# Patient Record
Sex: Female | Born: 1950 | Race: Black or African American | Hispanic: No | Marital: Married | Smoking: Never smoker
Health system: Southern US, Community
[De-identification: ages and names within clinical notes are randomized; demographics above are authoritative.]

## PROBLEM LIST (undated history)

## (undated) DIAGNOSIS — I10 Essential (primary) hypertension: Secondary | ICD-10-CM

## (undated) DIAGNOSIS — N189 Chronic kidney disease, unspecified: Secondary | ICD-10-CM

## (undated) HISTORY — PX: MULTIPLE TOOTH EXTRACTIONS: SHX2053

## (undated) HISTORY — PX: CATARACT EXTRACTION: SUR2

## (undated) HISTORY — PX: EYE SURGERY: SHX253

## (undated) HISTORY — PX: BREAST BIOPSY: SHX20

## (undated) HISTORY — PX: COLONOSCOPY W/ BIOPSIES AND POLYPECTOMY: SHX1376

---

## 1997-10-05 ENCOUNTER — Other Ambulatory Visit: Admission: RE | Admit: 1997-10-05 | Discharge: 1997-10-05 | Payer: Self-pay | Admitting: Obstetrics

## 1998-03-07 ENCOUNTER — Ambulatory Visit (HOSPITAL_COMMUNITY): Admission: RE | Admit: 1998-03-07 | Discharge: 1998-03-07 | Payer: Self-pay | Admitting: Obstetrics

## 1999-05-02 ENCOUNTER — Other Ambulatory Visit: Admission: RE | Admit: 1999-05-02 | Discharge: 1999-05-02 | Payer: Self-pay | Admitting: Obstetrics

## 1999-05-10 ENCOUNTER — Ambulatory Visit (HOSPITAL_COMMUNITY): Admission: RE | Admit: 1999-05-10 | Discharge: 1999-05-10 | Payer: Self-pay | Admitting: Obstetrics

## 1999-05-10 ENCOUNTER — Encounter: Payer: Self-pay | Admitting: Obstetrics

## 2000-06-17 ENCOUNTER — Ambulatory Visit (HOSPITAL_COMMUNITY): Admission: RE | Admit: 2000-06-17 | Discharge: 2000-06-17 | Payer: Self-pay | Admitting: Obstetrics

## 2000-06-17 ENCOUNTER — Encounter: Payer: Self-pay | Admitting: Obstetrics

## 2000-08-19 ENCOUNTER — Other Ambulatory Visit: Admission: RE | Admit: 2000-08-19 | Discharge: 2000-08-19 | Payer: Self-pay | Admitting: Obstetrics

## 2001-07-07 ENCOUNTER — Encounter: Payer: Self-pay | Admitting: Obstetrics

## 2001-07-07 ENCOUNTER — Ambulatory Visit (HOSPITAL_COMMUNITY): Admission: RE | Admit: 2001-07-07 | Discharge: 2001-07-07 | Payer: Self-pay | Admitting: Obstetrics

## 2002-08-31 ENCOUNTER — Ambulatory Visit (HOSPITAL_COMMUNITY): Admission: RE | Admit: 2002-08-31 | Discharge: 2002-08-31 | Payer: Self-pay | Admitting: Obstetrics

## 2002-08-31 ENCOUNTER — Encounter: Payer: Self-pay | Admitting: Obstetrics

## 2003-09-26 ENCOUNTER — Ambulatory Visit (HOSPITAL_COMMUNITY): Admission: RE | Admit: 2003-09-26 | Discharge: 2003-09-26 | Payer: Self-pay | Admitting: Obstetrics

## 2003-09-28 ENCOUNTER — Encounter: Admission: RE | Admit: 2003-09-28 | Discharge: 2003-09-28 | Payer: Self-pay | Admitting: Obstetrics

## 2004-10-18 ENCOUNTER — Ambulatory Visit (HOSPITAL_COMMUNITY): Admission: RE | Admit: 2004-10-18 | Discharge: 2004-10-18 | Payer: Self-pay | Admitting: Obstetrics

## 2005-12-30 ENCOUNTER — Encounter: Admission: RE | Admit: 2005-12-30 | Discharge: 2005-12-30 | Payer: Self-pay | Admitting: Obstetrics

## 2005-12-31 ENCOUNTER — Ambulatory Visit (HOSPITAL_COMMUNITY): Admission: RE | Admit: 2005-12-31 | Discharge: 2005-12-31 | Payer: Self-pay | Admitting: Obstetrics

## 2007-01-30 ENCOUNTER — Ambulatory Visit (HOSPITAL_COMMUNITY): Admission: RE | Admit: 2007-01-30 | Discharge: 2007-01-30 | Payer: Self-pay | Admitting: Obstetrics

## 2007-02-06 ENCOUNTER — Encounter: Admission: RE | Admit: 2007-02-06 | Discharge: 2007-02-06 | Payer: Self-pay | Admitting: Obstetrics

## 2007-05-05 ENCOUNTER — Encounter (INDEPENDENT_AMBULATORY_CARE_PROVIDER_SITE_OTHER): Payer: Self-pay | Admitting: Gastroenterology

## 2007-05-05 ENCOUNTER — Ambulatory Visit (HOSPITAL_COMMUNITY): Admission: RE | Admit: 2007-05-05 | Discharge: 2007-05-05 | Payer: Self-pay | Admitting: Gastroenterology

## 2007-05-28 HISTORY — PX: BREAST SURGERY: SHX581

## 2008-02-25 ENCOUNTER — Encounter: Admission: RE | Admit: 2008-02-25 | Discharge: 2008-02-25 | Payer: Self-pay | Admitting: Obstetrics

## 2008-03-02 ENCOUNTER — Encounter (INDEPENDENT_AMBULATORY_CARE_PROVIDER_SITE_OTHER): Payer: Self-pay | Admitting: Diagnostic Radiology

## 2008-03-02 ENCOUNTER — Encounter: Admission: RE | Admit: 2008-03-02 | Discharge: 2008-03-02 | Payer: Self-pay | Admitting: Obstetrics

## 2009-04-11 ENCOUNTER — Ambulatory Visit (HOSPITAL_COMMUNITY): Admission: RE | Admit: 2009-04-11 | Discharge: 2009-04-11 | Payer: Self-pay | Admitting: Cardiology

## 2009-05-27 HISTORY — PX: REFRACTIVE SURGERY: SHX103

## 2009-08-04 ENCOUNTER — Encounter: Admission: RE | Admit: 2009-08-04 | Discharge: 2009-08-04 | Payer: Self-pay | Admitting: Obstetrics

## 2009-08-10 ENCOUNTER — Ambulatory Visit: Payer: Self-pay | Admitting: Hematology and Oncology

## 2009-08-25 LAB — CBC & DIFF AND RETIC
EOS%: 0.5 % (ref 0.0–7.0)
Eosinophils Absolute: 0 10*3/uL (ref 0.0–0.5)
HGB: 10.9 g/dL — ABNORMAL LOW (ref 11.6–15.9)
Immature Retic Fract: 3.2 % (ref 0.00–10.70)
MCHC: 34 g/dL (ref 31.5–36.0)
MONO#: 0.2 10*3/uL (ref 0.1–0.9)
NEUT#: 2.5 10*3/uL (ref 1.5–6.5)
Platelets: 164 10*3/uL (ref 145–400)
Retic %: 1.1 % (ref 0.50–1.50)
WBC: 4.1 10*3/uL (ref 3.9–10.3)
lymph#: 1.3 10*3/uL (ref 0.9–3.3)

## 2009-08-25 LAB — URINALYSIS, MICROSCOPIC - CHCC
Bilirubin (Urine): NEGATIVE
Nitrite: NEGATIVE
Protein: <30 mg/dL

## 2009-08-25 LAB — MORPHOLOGY

## 2009-08-29 LAB — PROTEIN ELECTROPHORESIS, SERUM, WITH REFLEX
Alpha-1-Globulin: 7.8 % — ABNORMAL HIGH (ref 2.9–4.9)
Alpha-2-Globulin: 9.9 % (ref 7.1–11.8)
Beta 2: 4.3 % (ref 3.2–6.5)
Beta Globulin: 7.3 % — ABNORMAL HIGH (ref 4.7–7.2)
Total Protein, Serum Electrophoresis: 7.2 g/dL (ref 6.0–8.3)

## 2009-08-29 LAB — TSH: TSH: 1.1 u[IU]/mL (ref 0.350–4.500)

## 2009-08-29 LAB — COMPREHENSIVE METABOLIC PANEL
ALT: 25 U/L (ref 0–35)
Alkaline Phosphatase: 64 U/L (ref 39–117)
CO2: 25 mEq/L (ref 19–32)
Calcium: 8.9 mg/dL (ref 8.4–10.5)
Chloride: 103 mEq/L (ref 96–112)
Creatinine, Ser: 1.03 mg/dL (ref 0.40–1.20)

## 2009-08-29 LAB — HAPTOGLOBIN: Haptoglobin: 127 mg/dL (ref 16–200)

## 2009-09-04 LAB — BASIC METABOLIC PANEL
CO2: 25 mEq/L (ref 19–32)
Potassium: 3.7 mEq/L (ref 3.5–5.3)
Sodium: 141 mEq/L (ref 135–145)

## 2009-09-04 LAB — HEMOGLOBINOPATHY EVALUATION
Hgb A: 97.2 % (ref 96.8–97.8)
Hgb S Quant: 0 % (ref 0.0–0.0)

## 2009-11-19 ENCOUNTER — Emergency Department (HOSPITAL_COMMUNITY): Admission: EM | Admit: 2009-11-19 | Discharge: 2009-11-19 | Payer: Self-pay | Admitting: Emergency Medicine

## 2009-12-20 ENCOUNTER — Ambulatory Visit: Payer: Self-pay | Admitting: Hematology and Oncology

## 2010-06-17 ENCOUNTER — Encounter: Payer: Self-pay | Admitting: Cardiology

## 2010-08-12 LAB — BASIC METABOLIC PANEL
BUN: 18 mg/dL (ref 6–23)
CO2: 28 mEq/L (ref 19–32)
Chloride: 105 mEq/L (ref 96–112)
Creatinine, Ser: 1.2 mg/dL (ref 0.4–1.2)
Glucose, Bld: 141 mg/dL — ABNORMAL HIGH (ref 70–99)
Potassium: 3.9 mEq/L (ref 3.5–5.1)
Sodium: 142 mEq/L (ref 135–145)

## 2010-08-12 LAB — TROPONIN I: Troponin I: 0.02 ng/mL (ref 0.00–0.06)

## 2010-08-12 LAB — DIFFERENTIAL
Basophils Absolute: 0 10*3/uL (ref 0.0–0.1)
Lymphocytes Relative: 16 % (ref 12–46)

## 2010-08-12 LAB — CBC
HCT: 30.4 % — ABNORMAL LOW (ref 36.0–46.0)
Hemoglobin: 10.4 g/dL — ABNORMAL LOW (ref 12.0–15.0)
MCH: 28.8 pg (ref 26.0–34.0)
MCHC: 34.3 g/dL (ref 30.0–36.0)
MCV: 84.1 fL (ref 78.0–100.0)
RBC: 3.62 MIL/uL — ABNORMAL LOW (ref 3.87–5.11)
RDW: 13.4 % (ref 11.5–15.5)
WBC: 6 10*3/uL (ref 4.0–10.5)

## 2010-08-12 LAB — CK TOTAL AND CKMB (NOT AT ARMC): Total CK: 189 U/L — ABNORMAL HIGH (ref 7–177)

## 2010-08-27 ENCOUNTER — Other Ambulatory Visit: Payer: Self-pay | Admitting: Obstetrics

## 2010-08-27 DIAGNOSIS — Z1231 Encounter for screening mammogram for malignant neoplasm of breast: Secondary | ICD-10-CM

## 2010-08-31 ENCOUNTER — Ambulatory Visit
Admission: RE | Admit: 2010-08-31 | Discharge: 2010-08-31 | Disposition: A | Discharge status: Home / Self Care | Payer: 59 | Source: Ambulatory Visit | Attending: Obstetrics | Admitting: Obstetrics

## 2010-08-31 DIAGNOSIS — Z1231 Encounter for screening mammogram for malignant neoplasm of breast: Secondary | ICD-10-CM

## 2010-09-10 ENCOUNTER — Other Ambulatory Visit (HOSPITAL_COMMUNITY): Payer: Self-pay | Admitting: Obstetrics

## 2010-09-10 DIAGNOSIS — Z780 Asymptomatic menopausal state: Secondary | ICD-10-CM

## 2010-09-18 ENCOUNTER — Ambulatory Visit (HOSPITAL_COMMUNITY): Admission: RE | Admit: 2010-09-18 | Payer: 59 | Source: Ambulatory Visit

## 2010-09-20 ENCOUNTER — Ambulatory Visit (HOSPITAL_COMMUNITY)
Admission: RE | Admit: 2010-09-20 | Discharge: 2010-09-20 | Disposition: A | Discharge status: Home / Self Care | Payer: 59 | Source: Ambulatory Visit | Attending: Obstetrics | Admitting: Obstetrics

## 2010-09-20 DIAGNOSIS — Z13820 Encounter for screening for osteoporosis: Secondary | ICD-10-CM | POA: Insufficient documentation

## 2010-09-20 DIAGNOSIS — Z780 Asymptomatic menopausal state: Secondary | ICD-10-CM | POA: Insufficient documentation

## 2010-10-09 NOTE — Op Note (Signed)
NAMEAUBRIEGH, Kristy Santos               ACCOUNT NO.:  [id number]   MEDICAL RECORD NO.:  [id number]          PATIENT TYPE:  AMB   LOCATION:  ENDO                         FACILITY:  [facility]   PHYSICIAN:  Ronald Lobo, M.D.   DATE OF BIRTH:  28-Jan-1951   DATE OF PROCEDURE:  05/05/2007  DATE OF DISCHARGE:                               OPERATIVE REPORT   PROCEDURE PERFORMED:  Colonoscopy with biopsy.   ENDOSCOPIST:  Cleotis Nipper, M.D.   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  No family history of colon cancer or colon polyps.   FINDINGS:  Several small polyps removed.  Right-sided diverticulosis.   DESCRIPTION OF PROCEDURE:  The nature, purpose and risks of the  procedure had been discussed with the patient, who provided written  consent.  Sedation was fentanyl 100 mcg and Versed 10 mg IV prior to and  during the course of the procedure without clinical instability.   The Pentax adult video colonoscope was advanced to the cecum without  significant difficulty, using some external abdominal compression to  control looping to enter the base of the cecum which was identified by  clear visualization of the appendiceal orifice.  The terminal ileum was  not entered despite brief attempts to do so.   Pullback was then performed.  There was some mild diverticular change in  the ascending colon and possibly also in the proximal transverse colon.   There was a diminutive 2 mm polyp on a fold in the ascending colon  removed by a single cold biopsy.   There was a 6 mm semipedunculated polyp removed by cold biopsy  technique, very soft tissue, at about 50 cm, probably the distal  transverse colon near the splenic flexure.  There were several  diminutive sessile hyperplastic appearing polyps removed by cold biopsy  in the midsigmoid region at about 25 cm.   No large polyps, cancer, colitis  or vascular ectasia were observed and  retroflexion in the rectum and reinspection of the rectum was  unremarkable.  The patient tolerated the procedure well and there were  no apparent complications.   IMPRESSION:  1. Colon polyps removed as described above, diminutive or small in      size.  2. Proximal colonic diverticulosis.   PLAN:  Await pathology results.           ______________________________  Ronald Lobo, M.D.     RB/MEDQ  D:  05/05/2007  T:  05/05/2007  Job:  [id number]   cc:   Ardyth Gal. Spruill, M.D.  Fax: [phone]

## 2011-12-23 HISTORY — PX: ENDOMETRIAL BIOPSY: SHX622

## 2011-12-24 ENCOUNTER — Other Ambulatory Visit: Payer: Self-pay | Admitting: Obstetrics

## 2011-12-24 DIAGNOSIS — Z1231 Encounter for screening mammogram for malignant neoplasm of breast: Secondary | ICD-10-CM

## 2011-12-27 ENCOUNTER — Ambulatory Visit: Payer: 59

## 2011-12-31 ENCOUNTER — Encounter (HOSPITAL_COMMUNITY): Payer: Self-pay | Admitting: Pharmacy Technician

## 2012-01-01 ENCOUNTER — Encounter (HOSPITAL_COMMUNITY): Payer: Self-pay | Admitting: Pharmacy Technician

## 2012-01-02 ENCOUNTER — Ambulatory Visit
Admission: RE | Admit: 2012-01-02 | Discharge: 2012-01-02 | Disposition: A | Discharge status: Home / Self Care | Payer: 59 | Source: Ambulatory Visit | Attending: Obstetrics | Admitting: Obstetrics

## 2012-01-02 DIAGNOSIS — Z1231 Encounter for screening mammogram for malignant neoplasm of breast: Secondary | ICD-10-CM

## 2012-01-07 ENCOUNTER — Ambulatory Visit: Payer: 59 | Attending: Gynecologic Oncology | Admitting: Gynecologic Oncology

## 2012-01-07 ENCOUNTER — Encounter (HOSPITAL_COMMUNITY)
Admission: RE | Admit: 2012-01-07 | Discharge: 2012-01-07 | Disposition: A | Discharge status: Home / Self Care | Payer: 59 | Source: Ambulatory Visit | Attending: Gynecologic Oncology | Admitting: Gynecologic Oncology

## 2012-01-07 ENCOUNTER — Ambulatory Visit (HOSPITAL_COMMUNITY)
Admission: RE | Admit: 2012-01-07 | Discharge: 2012-01-07 | Disposition: A | Discharge status: Home / Self Care | Payer: 59 | Source: Ambulatory Visit | Attending: Gynecologic Oncology | Admitting: Gynecologic Oncology

## 2012-01-07 ENCOUNTER — Encounter (HOSPITAL_COMMUNITY): Payer: Self-pay

## 2012-01-07 ENCOUNTER — Encounter: Payer: Self-pay | Admitting: Gynecologic Oncology

## 2012-01-07 VITALS — BP 148/64 | HR 78 | Temp 98.3°F | Resp 22 | Ht 63.5 in | Wt 199.0 lb

## 2012-01-07 DIAGNOSIS — Z01818 Encounter for other preprocedural examination: Secondary | ICD-10-CM | POA: Insufficient documentation

## 2012-01-07 DIAGNOSIS — C549 Malignant neoplasm of corpus uteri, unspecified: Secondary | ICD-10-CM | POA: Insufficient documentation

## 2012-01-07 DIAGNOSIS — Z803 Family history of malignant neoplasm of breast: Secondary | ICD-10-CM | POA: Insufficient documentation

## 2012-01-07 DIAGNOSIS — R9431 Abnormal electrocardiogram [ECG] [EKG]: Secondary | ICD-10-CM | POA: Insufficient documentation

## 2012-01-07 DIAGNOSIS — Z79899 Other long term (current) drug therapy: Secondary | ICD-10-CM | POA: Insufficient documentation

## 2012-01-07 DIAGNOSIS — Z01812 Encounter for preprocedural laboratory examination: Secondary | ICD-10-CM | POA: Insufficient documentation

## 2012-01-07 DIAGNOSIS — I4510 Unspecified right bundle-branch block: Secondary | ICD-10-CM | POA: Insufficient documentation

## 2012-01-07 DIAGNOSIS — Z01810 Encounter for preprocedural cardiovascular examination: Secondary | ICD-10-CM | POA: Insufficient documentation

## 2012-01-07 DIAGNOSIS — C541 Malignant neoplasm of endometrium: Secondary | ICD-10-CM

## 2012-01-07 DIAGNOSIS — I517 Cardiomegaly: Secondary | ICD-10-CM | POA: Insufficient documentation

## 2012-01-07 DIAGNOSIS — I498 Other specified cardiac arrhythmias: Secondary | ICD-10-CM | POA: Insufficient documentation

## 2012-01-07 HISTORY — DX: Essential (primary) hypertension: I10

## 2012-01-07 LAB — CBC WITH DIFFERENTIAL/PLATELET
Basophils Absolute: 0 10*3/uL (ref 0.0–0.1)
Basophils Relative: 0 % (ref 0–1)
Eosinophils Absolute: 0.1 10*3/uL (ref 0.0–0.7)
Eosinophils Relative: 1 % (ref 0–5)
HCT: 30.1 % — ABNORMAL LOW (ref 36.0–46.0)
Lymphocytes Relative: 27 % (ref 12–46)
MCH: 28.5 pg (ref 26.0–34.0)
MCHC: 34.9 g/dL (ref 30.0–36.0)
MCV: 81.6 fL (ref 78.0–100.0)
Monocytes Absolute: 0.2 10*3/uL (ref 0.1–1.0)
RDW: 13.1 % (ref 11.5–15.5)

## 2012-01-07 LAB — COMPREHENSIVE METABOLIC PANEL
AST: 17 U/L (ref 0–37)
CO2: 27 mEq/L (ref 19–32)
Calcium: 9.5 mg/dL (ref 8.4–10.5)
Creatinine, Ser: 1.53 mg/dL — ABNORMAL HIGH (ref 0.50–1.10)
GFR calc non Af Amer: 36 mL/min — ABNORMAL LOW (ref >90)
Total Protein: 7.6 g/dL (ref 6.0–8.3)

## 2012-01-07 NOTE — Patient Instructions (Addendum)
Kristy Santos is aware that the procedure robotic assisted laparoscopic hysterectomy bilateral salpingo-oophorectomy possible total lymph node dissection will occur on Tuesday, 01/01/2012.  The surgeon will be Dr. Nancy Marus.  Thank you very much Kristy Santos for allowing me to provide care for you today.  I appreciate your confidence in choosing our Gynecologic Oncology team.  If you have any questions about your visit today please call our office and we will get back to you as soon as possible.  Kristy Found. Inette Doubrava MD., PhD Gynecologic Oncology

## 2012-01-07 NOTE — Patient Instructions (Signed)
[place]  01/07/2012   Your procedure is scheduled on:  Tuesday 01/21/2012 at 0715am  Report to [facility] at [place] AM.  Call this number if you have problems the morning of surgery: [phone]   Remember:   Do not eat food:After Midnight.  May have clear liquids:until Midnight .    Take these medicines the morning of surgery with A SIP OF WATER: Norvasc   Do not wear jewelry  Do not wear lotions, powders, or perfumes.  Do not shave 48 hours prior to surgery. Men may shave face and neck.  Do not bring valuables to the hospital.  Contacts, dentures or bridgework may not be worn into surgery.  Leave suitcase in the car. After surgery it may be brought to your room.  For patients admitted to the hospital, checkout time is 11:00 AM the day of discharge.       Special Instructions: CHG Shower Use Special Wash: 1/2 bottle night before surgery and 1/2 bottle morning of surgery.   Please read over the following fact sheets that you were given: MRSA Information, Sleep apnea sheet, Incentive Spirometry sheet, Blood Transfusion sheet                 If you have any questions, please call me at [place].Trudee Grip, RN,BSN

## 2012-01-07 NOTE — Progress Notes (Signed)
Consult Note: Gyn-Onc  Consult was requested by Dr. Ruthann Cancer for the evaluation of Kristy Santos 61 y.o. female  CC:  Chief Complaint  Patient presents with  . endometrial adenocarcinoma    New consult     HPI:  G1P1 LNMP 10 years ago.  Patient noted vaginal spotting that stopped.  Reported intermittent vaginal spotting since May 2013.  Seen for her annual examination 12/23/2011 and an EMB was collected that is c/w FIGO grade 1 endometrial cancer.  Patient feels wlel, no abdominal pain, no diarrhea no vomiting.  No weight loss, no changes in bladder habits.   Current Meds:  Outpatient Encounter Prescriptions as of 01/07/2012  Medication Sig Dispense Refill  . amLODipine (NORVASC) 5 MG tablet Take 5 mg by mouth 2 (two) times daily.      . fish oil-omega-3 fatty acids 1000 MG capsule Take 1 g by mouth daily.      . furosemide (LASIX) 40 MG tablet Take 40 mg by mouth daily after breakfast.      . glimepiride (AMARYL) 4 MG tablet Take 2 mg by mouth 2 (two) times daily.      Marland Kitchen lisinopril (PRINIVIL,ZESTRIL) 40 MG tablet Take 40 mg by mouth daily after breakfast.      . metFORMIN (GLUCOPHAGE) 1000 MG tablet Take 1,000 mg by mouth 2 (two) times daily with a meal.      . metoprolol succinate (TOPROL-XL) 50 MG 24 hr tablet Take 50 mg by mouth every evening.      . Multiple Vitamin (MULTIVITAMIN WITH MINERALS) TABS Take 1 tablet by mouth daily.        Allergy: No Known Allergies  Social Hx:   History   Social History  . Marital Status: Married    Spouse Name: N/A    Number of Children: N/A  . Years of Education: N/A   Occupational History  . Not on file.   Social History Main Topics  . Smoking status: Not on file  . Smokeless tobacco: Not on file  . Alcohol Use: No  . Drug Use: No  . Sexually Active:    Other Topics Concern  . Not on file   Social History Narrative  . No narrative on file  Last mammogram 12/2011 results pending Colonoscopy 2008 removal of three benign  polyps.  Past Surgical History  Procedure Date  . Breast surgery 2009    right breast-benign  . Refractive surgery 2011    right eye  . Endometrial biopsy 12/23/2011      Past Medical Hx:  Past Medical History  Diagnosis Date  . Hypertension   . Diabetes mellitus   Bronchitis 2011  Past Gynecological History:  G1 P1 Menarche 10, irregular menses until use of OCP. OCP x 11 years.  IUD x 2 years.  No LMP recorded. Patient is postmenopausal. NO h/o abnormal pap test.  NSVD x1  Family History:   Mother breast cancer dx 21 years.    Review of Systems:  Constitutional  Feels very well   Cardiovascular  No chest pain, shortness of breath Pulmonary  No cough or wheeze.  Gastro Intestinal  No nausea, vomitting, or diarrhoea. No bright red blood per rectum, no abdominal pain, change in bowel movement, or constipation.  Genito Urinary  No frequency, urgency, dysuria,vaginal spotting Musculo Skeletal  No myalgia, arthralgia, joint swelling or pain  Neurologic  No weakness, numbness, change in gait,  Psychology  No depression, anxiety, insomnia.   Vitals: BP 148/64  Pulse 78  Temp 98.3 F (36.8 C) (Oral)  Resp 22  Ht 5' 3.5" (1.613 m)  Wt 199 lb (90.266 kg)  BMI 34.70 kg/m2  Physical Exam: WD in NAD Neck  Supple NROM, without any enlargements.  Lymph Node Survey No cervical supraclavicular or inguinal adenopathy Cardiovascular  Pulse normal rate, regularity and rhythm. S1 and S2 normal.  Lungs  Clear to auscultation bilateraly, without wheezes/crackles/rhonchi. Psychiatry  Alert and oriented to person, place, and time  Abdomen  Normoactive bowel sounds, abdomen soft, non-tender and obese.  Back No CVA tenderness Genito Urinary  Vulva/vagina: Normal external female genitalia.  No lesions. No discharge or bleeding.  Bladder/urethra:  No lesions or masses  Vagina: trace blood in the vault.   Cervix: Normal appearing, no lesions.  Uterus: grade 1 uterine  descensus .  Uterus is enlarged with possible lower uterine segment leiomyoma  No parametrial involvement or nodularity.  Adnexa: No palpable masses. Rectal  Good tone, no masses no cul de sac nodularity.  Extremities  No bilateral cyanosis, clubbing or edema.   Assessment/Plan:  [name]  is a 61 y.o.  year old  with a FIGO grade 1 endometrial adenocarcinoma.  The recommendation was made for surgical staging and open versus minimally invasive endometrial cancer staging was discussed with the patient. The patient opted for minimally invasive robotic approach.  I extensively reviewed the risks of robotic hysterectomy and possible lymph node dissection of infection, bleeding, damage to nearby organs including bowel, bladder, vessels, nerves, and ureters. We discussed postoperative risks including infection, PE/ DVT, and lymphedema. We reviewed possible need for conversion to open laparotomy, need for possible blood transfusion. I discussed positioning during surgery of trendelenberg and risks of minor facial swelling and care we take in preoperative positioning. We also reviewed possible need for further treatment such as radiation or chemotherapy based on final pathology. Expected postoperative recovery was also discussed.  The uterus appears enlarged. A pelvic ultrasound will be obtained to determine the size of the uterus. The patient and her daughter are aware that if the uterus is too large to be delivered vaginally consideration will be made for many laparotomy for extraction of the uterus after completion of the staging procedure.   Ms.Kristy Santos is aware that the procedure will occur on Tuesday, 01/01/2012 and that her surgeon will be Dr. Nancy Marus.       Kristy Morning, MD, PhD 01/07/2012, 2:58 PM

## 2012-01-08 ENCOUNTER — Ambulatory Visit (HOSPITAL_COMMUNITY): Payer: 59

## 2012-01-08 ENCOUNTER — Telehealth: Payer: Self-pay | Admitting: *Deleted

## 2012-01-08 ENCOUNTER — Ambulatory Visit (HOSPITAL_COMMUNITY)
Admission: RE | Admit: 2012-01-08 | Discharge: 2012-01-08 | Disposition: A | Discharge status: Home / Self Care | Payer: 59 | Source: Ambulatory Visit | Attending: Gynecologic Oncology | Admitting: Gynecologic Oncology

## 2012-01-08 ENCOUNTER — Other Ambulatory Visit: Payer: Self-pay | Admitting: Gynecologic Oncology

## 2012-01-08 DIAGNOSIS — C541 Malignant neoplasm of endometrium: Secondary | ICD-10-CM

## 2012-01-08 DIAGNOSIS — C549 Malignant neoplasm of corpus uteri, unspecified: Secondary | ICD-10-CM | POA: Insufficient documentation

## 2012-01-08 NOTE — Progress Notes (Signed)
Kristy Santos notified of abnormal labs.

## 2012-01-08 NOTE — Telephone Encounter (Signed)
Pt notified of [place] resluts

## 2012-01-21 ENCOUNTER — Encounter (HOSPITAL_COMMUNITY): Payer: Self-pay | Admitting: *Deleted

## 2012-01-21 ENCOUNTER — Ambulatory Visit (HOSPITAL_COMMUNITY): Payer: 59 | Admitting: Certified Registered Nurse Anesthetist

## 2012-01-21 ENCOUNTER — Encounter (HOSPITAL_COMMUNITY): Payer: Self-pay | Admitting: Certified Registered Nurse Anesthetist

## 2012-01-21 ENCOUNTER — Encounter (HOSPITAL_COMMUNITY)
Admission: RE | Disposition: A | Discharge status: Home / Self Care | Payer: Self-pay | Source: Ambulatory Visit | Attending: Obstetrics & Gynecology

## 2012-01-21 ENCOUNTER — Encounter (HOSPITAL_COMMUNITY): Payer: Self-pay | Admitting: Gynecologic Oncology

## 2012-01-21 ENCOUNTER — Inpatient Hospital Stay (HOSPITAL_COMMUNITY)
Admission: RE | Admit: 2012-01-21 | Discharge: 2012-01-22 | DRG: 741 | Disposition: A | Discharge status: Home / Self Care | Payer: 59 | Source: Ambulatory Visit | Attending: Obstetrics & Gynecology | Admitting: Obstetrics & Gynecology

## 2012-01-21 DIAGNOSIS — I10 Essential (primary) hypertension: Secondary | ICD-10-CM | POA: Diagnosis present

## 2012-01-21 DIAGNOSIS — C541 Malignant neoplasm of endometrium: Secondary | ICD-10-CM | POA: Diagnosis present

## 2012-01-21 DIAGNOSIS — Z79899 Other long term (current) drug therapy: Secondary | ICD-10-CM

## 2012-01-21 DIAGNOSIS — E119 Type 2 diabetes mellitus without complications: Secondary | ICD-10-CM | POA: Diagnosis present

## 2012-01-21 DIAGNOSIS — C549 Malignant neoplasm of corpus uteri, unspecified: Principal | ICD-10-CM | POA: Diagnosis present

## 2012-01-21 DIAGNOSIS — C55 Malignant neoplasm of uterus, part unspecified: Secondary | ICD-10-CM

## 2012-01-21 HISTORY — PX: TOTAL ABDOMINAL HYSTERECTOMY W/ BILATERAL SALPINGOOPHORECTOMY: SHX83

## 2012-01-21 LAB — TYPE AND SCREEN
ABO/RH(D): O POS
Antibody Screen: NEGATIVE

## 2012-01-21 LAB — GLUCOSE, CAPILLARY: Glucose-Capillary: 156 mg/dL — ABNORMAL HIGH (ref 70–99)

## 2012-01-21 SURGERY — ROBOTIC ASSISTED TOTAL HYSTERECTOMY WITH BILATERAL SALPINGO OOPHORECTOMY
Anesthesia: General | Site: Abdomen | Laterality: Bilateral | Wound class: Clean Contaminated

## 2012-01-21 MED ORDER — ONDANSETRON HCL 4 MG/2ML IJ SOLN
4.0000 mg | Freq: Four times a day (QID) | INTRAMUSCULAR | Status: DC | PRN
  Administered 2012-01-21: 4 mg via INTRAVENOUS
  Filled 2012-01-21: qty 2

## 2012-01-21 MED ORDER — ONDANSETRON HCL 4 MG PO TABS: 4.0000 mg | ORAL_TABLET | Freq: Four times a day (QID) | ORAL | Status: DC | PRN

## 2012-01-21 MED ORDER — IBUPROFEN 600 MG PO TABS
600.0000 mg | ORAL_TABLET | Freq: Four times a day (QID) | ORAL | Status: DC | PRN
  Administered 2012-01-21 – 2012-01-22 (×2): 600 mg via ORAL
  Filled 2012-01-21: qty 1

## 2012-01-21 MED ORDER — PROPOFOL 10 MG/ML IV EMUL
INTRAVENOUS | Status: DC | PRN
  Administered 2012-01-21: 150 mg via INTRAVENOUS

## 2012-01-21 MED ORDER — METOPROLOL SUCCINATE ER 50 MG PO TB24
50.0000 mg | ORAL_TABLET | Freq: Every evening | ORAL | Status: DC
  Administered 2012-01-21: 50 mg via ORAL
  Filled 2012-01-21 (×2): qty 1

## 2012-01-21 MED ORDER — AMLODIPINE BESYLATE 5 MG PO TABS
5.0000 mg | ORAL_TABLET | Freq: Two times a day (BID) | ORAL | Status: DC
  Administered 2012-01-21 – 2012-01-22 (×2): 5 mg via ORAL
  Filled 2012-01-21 (×3): qty 1

## 2012-01-21 MED ORDER — MORPHINE SULFATE 2 MG/ML IJ SOLN: 2.0000 mg | INTRAMUSCULAR | Status: DC | PRN

## 2012-01-21 MED ORDER — FENTANYL CITRATE 0.05 MG/ML IJ SOLN
25.0000 ug | INTRAMUSCULAR | Status: DC | PRN
  Administered 2012-01-21: 50 ug via INTRAVENOUS

## 2012-01-21 MED ORDER — PROMETHAZINE HCL 25 MG/ML IJ SOLN
6.2500 mg | INTRAMUSCULAR | Status: DC | PRN
  Administered 2012-01-21: 6.25 mg via INTRAVENOUS

## 2012-01-21 MED ORDER — INSULIN ASPART 100 UNIT/ML ~~LOC~~ SOLN
0.0000 [IU] | SUBCUTANEOUS | Status: DC
Start: 2012-01-21 — End: 2012-01-22
  Administered 2012-01-21: 8 [IU] via SUBCUTANEOUS
  Administered 2012-01-22: 3 [IU] via SUBCUTANEOUS

## 2012-01-21 MED ORDER — SUCCINYLCHOLINE CHLORIDE 20 MG/ML IJ SOLN
INTRAMUSCULAR | Status: DC | PRN
  Administered 2012-01-21: 100 mg via INTRAVENOUS

## 2012-01-21 MED ORDER — LACTATED RINGERS IV SOLN
INTRAVENOUS | Status: DC
  Administered 2012-01-21: 07:00:00 via INTRAVENOUS

## 2012-01-21 MED ORDER — NEOSTIGMINE METHYLSULFATE 1 MG/ML IJ SOLN
INTRAMUSCULAR | Status: DC | PRN
  Administered 2012-01-21: 5 mg via INTRAVENOUS

## 2012-01-21 MED ORDER — HYDRALAZINE HCL 20 MG/ML IJ SOLN
INTRAMUSCULAR | Status: DC | PRN
  Administered 2012-01-21: 5 mg via INTRAVENOUS

## 2012-01-21 MED ORDER — GLYCOPYRROLATE 0.2 MG/ML IJ SOLN
INTRAMUSCULAR | Status: DC | PRN
  Administered 2012-01-21: 0.6 mg via INTRAVENOUS

## 2012-01-21 MED ORDER — LIDOCAINE HCL (CARDIAC) 20 MG/ML IV SOLN
INTRAVENOUS | Status: DC | PRN
  Administered 2012-01-21: 100 mg via INTRAVENOUS

## 2012-01-21 MED ORDER — ONDANSETRON HCL 4 MG/2ML IJ SOLN
INTRAMUSCULAR | Status: DC | PRN
  Administered 2012-01-21: 4 mg via INTRAVENOUS

## 2012-01-21 MED ORDER — GLIMEPIRIDE 2 MG PO TABS
2.0000 mg | ORAL_TABLET | Freq: Two times a day (BID) | ORAL | Status: DC
Start: 2012-01-21 — End: 2012-01-22
  Administered 2012-01-21 – 2012-01-22 (×3): 2 mg via ORAL
  Filled 2012-01-21 (×5): qty 1

## 2012-01-21 MED ORDER — ZOLPIDEM TARTRATE 5 MG PO TABS: 5.0000 mg | ORAL_TABLET | Freq: Every evening | ORAL | Status: DC | PRN

## 2012-01-21 MED ORDER — CEFAZOLIN SODIUM-DEXTROSE 2-3 GM-% IV SOLR
INTRAVENOUS | Status: DC | PRN
  Administered 2012-01-21: 2 g via INTRAVENOUS

## 2012-01-21 MED ORDER — ACETAMINOPHEN 10 MG/ML IV SOLN
INTRAVENOUS | Status: DC | PRN
  Administered 2012-01-21: 1000 mg via INTRAVENOUS

## 2012-01-21 MED ORDER — LACTATED RINGERS IV SOLN: INTRAVENOUS | Status: DC

## 2012-01-21 MED ORDER — LABETALOL HCL 5 MG/ML IV SOLN
INTRAVENOUS | Status: DC | PRN
  Administered 2012-01-21: 5 mg via INTRAVENOUS

## 2012-01-21 MED ORDER — KCL IN DEXTROSE-NACL 20-5-0.45 MEQ/L-%-% IV SOLN
INTRAVENOUS | Status: DC
  Administered 2012-01-21: 13:00:00 via INTRAVENOUS
  Filled 2012-01-21 (×2): qty 1000

## 2012-01-21 MED ORDER — FENTANYL CITRATE 0.05 MG/ML IJ SOLN
INTRAMUSCULAR | Status: AC
  Filled 2012-01-21: qty 2

## 2012-01-21 MED ORDER — FENTANYL CITRATE 0.05 MG/ML IJ SOLN
INTRAMUSCULAR | Status: DC | PRN
  Administered 2012-01-21 (×5): 50 ug via INTRAVENOUS

## 2012-01-21 MED ORDER — OXYCODONE-ACETAMINOPHEN 5-325 MG PO TABS: 1.0000 | ORAL_TABLET | ORAL | Status: DC | PRN

## 2012-01-21 MED ORDER — FUROSEMIDE 40 MG PO TABS
40.0000 mg | ORAL_TABLET | Freq: Every day | ORAL | Status: DC
  Administered 2012-01-22: 40 mg via ORAL
  Filled 2012-01-21 (×2): qty 1

## 2012-01-21 MED ORDER — ACETAMINOPHEN 10 MG/ML IV SOLN
1000.0000 mg | Freq: Four times a day (QID) | INTRAVENOUS | Status: DC
  Administered 2012-01-21: 1000 mg via INTRAVENOUS
  Filled 2012-01-21 (×2): qty 100

## 2012-01-21 MED ORDER — LISINOPRIL 40 MG PO TABS
40.0000 mg | ORAL_TABLET | Freq: Every day | ORAL | Status: DC
  Administered 2012-01-22: 40 mg via ORAL
  Filled 2012-01-21: qty 1

## 2012-01-21 MED ORDER — ESMOLOL HCL 10 MG/ML IV SOLN
INTRAVENOUS | Status: DC | PRN
  Administered 2012-01-21 (×3): 20 mg via INTRAVENOUS

## 2012-01-21 MED ORDER — MIDAZOLAM HCL 5 MG/5ML IJ SOLN
INTRAMUSCULAR | Status: DC | PRN
  Administered 2012-01-21: 2 mg via INTRAVENOUS

## 2012-01-21 MED ORDER — ROCURONIUM BROMIDE 100 MG/10ML IV SOLN
INTRAVENOUS | Status: DC | PRN
  Administered 2012-01-21: 50 mg via INTRAVENOUS
  Administered 2012-01-21: 5 mg via INTRAVENOUS

## 2012-01-21 MED ORDER — METFORMIN HCL 500 MG PO TABS
1000.0000 mg | ORAL_TABLET | Freq: Two times a day (BID) | ORAL | Status: DC
  Administered 2012-01-21 – 2012-01-22 (×2): 1000 mg via ORAL
  Filled 2012-01-21 (×4): qty 2

## 2012-01-21 MED ORDER — LACTATED RINGERS IV SOLN
INTRAVENOUS | Status: DC | PRN
  Administered 2012-01-21: 1000 mL

## 2012-01-21 MED ORDER — HYDROMORPHONE HCL PF 1 MG/ML IJ SOLN
INTRAMUSCULAR | Status: DC | PRN
  Administered 2012-01-21 (×2): 0.5 mg via INTRAVENOUS

## 2012-01-21 MED ORDER — LIP MEDEX EX OINT
TOPICAL_OINTMENT | CUTANEOUS | Status: AC
  Administered 2012-01-21: 1
  Filled 2012-01-21: qty 7

## 2012-01-21 SURGICAL SUPPLY — 50 items
APL SKNCLS STERI-STRIP NONHPOA (GAUZE/BANDAGES/DRESSINGS)
BAG SPEC RTRVL LRG 6X4 10 (ENDOMECHANICALS) ×2
BENZOIN TINCTURE PRP APPL 2/3 (GAUZE/BANDAGES/DRESSINGS) ×1 IMPLANT
CHLORAPREP W/TINT 26ML (MISCELLANEOUS) ×3 IMPLANT
CLOTH BEACON ORANGE TIMEOUT ST (SAFETY) ×2 IMPLANT
CORD HIGH FREQUENCY UNIPOLAR (ELECTROSURGICAL) ×2 IMPLANT
CORDS BIPOLAR (ELECTRODE) ×2 IMPLANT
COVER MAYO STAND STRL (DRAPES) ×2 IMPLANT
COVER SURGICAL LIGHT HANDLE (MISCELLANEOUS) ×2 IMPLANT
COVER TIP SHEARS 8 DVNC (MISCELLANEOUS) ×1 IMPLANT
COVER TIP SHEARS 8MM DA VINCI (MISCELLANEOUS) ×1
DECANTER SPIKE VIAL GLASS SM (MISCELLANEOUS) ×1 IMPLANT
DRAPE LG THREE QUARTER DISP (DRAPES) ×4 IMPLANT
DRAPE SURG IRRIG POUCH 19X23 (DRAPES) ×2 IMPLANT
DRAPE TABLE BACK 44X90 PK DISP (DRAPES) ×3 IMPLANT
DRAPE UTILITY XL STRL (DRAPES) ×2 IMPLANT
DRAPE WARM FLUID 44X44 (DRAPE) ×2 IMPLANT
DRSG TEGADERM 6X8 (GAUZE/BANDAGES/DRESSINGS) ×4 IMPLANT
ELECT REM PT RETURN 9FT ADLT (ELECTROSURGICAL) ×2
ELECTRODE REM PT RTRN 9FT ADLT (ELECTROSURGICAL) ×1 IMPLANT
GAUZE VASELINE 3X9 (GAUZE/BANDAGES/DRESSINGS) IMPLANT
GLOVE BIO SURGEON STRL SZ 6.5 (GLOVE) ×8 IMPLANT
GLOVE BIO SURGEON STRL SZ7.5 (GLOVE) ×4 IMPLANT
GLOVE BIOGEL PI IND STRL 7.0 (GLOVE) ×2 IMPLANT
GLOVE BIOGEL PI INDICATOR 7.0 (GLOVE) ×2
GOWN PREVENTION PLUS XLARGE (GOWN DISPOSABLE) ×10 IMPLANT
HOLDER FOLEY CATH W/STRAP (MISCELLANEOUS) ×2 IMPLANT
KIT ACCESSORY DA VINCI DISP (KITS) ×1
KIT ACCESSORY DVNC DISP (KITS) ×1 IMPLANT
MANIPULATOR UTERINE 4.5 ZUMI (MISCELLANEOUS) ×2 IMPLANT
OCCLUDER COLPOPNEUMO (BALLOONS) ×2 IMPLANT
PACK LAPAROSCOPY W LONG (CUSTOM PROCEDURE TRAY) ×2 IMPLANT
POUCH SPECIMEN RETRIEVAL 10MM (ENDOMECHANICALS) ×4 IMPLANT
SET TUBE IRRIG SUCTION NO TIP (IRRIGATION / IRRIGATOR) ×2 IMPLANT
SHEET LAVH (DRAPES) ×2 IMPLANT
SOLUTION ELECTROLUBE (MISCELLANEOUS) ×2 IMPLANT
SPONGE LAP 18X18 X RAY DECT (DISPOSABLE) IMPLANT
STRIP CLOSURE SKIN 1/2X4 (GAUZE/BANDAGES/DRESSINGS) ×2 IMPLANT
SUT VIC AB 0 CT1 27 (SUTURE) ×6
SUT VIC AB 0 CT1 27XBRD ANTBC (SUTURE) ×3 IMPLANT
SUT VIC AB 4-0 PS2 27 (SUTURE) ×4 IMPLANT
SUT VICRYL 0 UR6 27IN ABS (SUTURE) ×2 IMPLANT
SYR 50ML LL SCALE MARK (SYRINGE) ×2 IMPLANT
SYR BULB IRRIGATION 50ML (SYRINGE) IMPLANT
TRAP SPECIMEN MUCOUS 40CC (MISCELLANEOUS) ×1 IMPLANT
TRAY FOLEY CATH 14FRSI W/METER (CATHETERS) ×2 IMPLANT
TROCAR XCEL 12X100 BLDLESS (ENDOMECHANICALS) ×2 IMPLANT
TROCAR XCEL BLADELESS 5X75MML (TROCAR) ×2 IMPLANT
TUBING FILTER THERMOFLATOR (ELECTROSURGICAL) ×1 IMPLANT
WATER STERILE IRR 1500ML POUR (IV SOLUTION) ×4 IMPLANT

## 2012-01-21 NOTE — Op Note (Signed)
PATIENT: Kristy Santos DATE OF BIRTH: 01-11-1951 ENCOUNTER DATE: 01/21/12   Preop Diagnosis: grade 1 endometrioid adenocarcinoma.   Postoperative Diagnosis: same.   Surgery: Total robotic hysterectomy bilateral salpingo-oophorectomy, bilateral pelvic lymph node dissection.   Surgeons:  Lucita Lora. Alycia Rossetti, MD; Lahoma Crocker, MD   Assistant: Caswell Corwin   Anesthesia: General   Estimated blood loss: <50 ml   IVF: 1800 ml   Urine output: [id number]   Complications: None   Pathology: Uterus, cervix, bilateral tubes and ovaries, bilateral pelvic lymph nodes to pathology.   Operative findings: Globular uterus with 2-3 cm myoma, significant uterine descensus. Normal appearing adnexa.  Frozen section revealed a grade 1 endometrial adenocarcinoma with minimal myometrial invasion.   Procedure: The patient was identified in the preoperative holding area. Informed consent was signed on the chart. Patient was seen history was reviewed and exam was performed.   The patient was then taken to the operating room and placed in the supine position with SCD hose on. General anesthesia was then induced without difficulty. She was then placed in the dorsolithotomy position. Her arms were tucked at her side with appropriate precautions on the gel pad. Shoulder blocks were then placed in the usual fashion with appropriate precautions. A OG-tube was placed to suction. First timeout was performed to confirm the patient procedure antibiotic allergy status, estimated estimated blood loss and OR time. The perineum was then prepped in the usual fashion with Betadine. A 14 French Foley was inserted into the bladder under sterile conditions. A sterile speculum was placed in the vagina. The cervix was without lesions. The cervix was grasped with a single-tooth tenaculum. The dilator without difficulty. A ZUMI with a medium Koe ring was placed without difficulty. The abdomen was then prepped with 2 Chlor prep sponges  per protocol.   Patient was then draped after the prep was dried. Second timeout was performed to confirm the above. After again confirming OG tube placement and that it was to suction, a stab-wound was made in left upper quadrant 2 cm below the costal margin on the left in the midclavicular line. A 5 mm operative report was used to assure intra-abdominal placement. The abdomen was insufflated. At this point all points during the procedure the patient's intra-abdominal pressure was not increased over 15 mm of mercury. After insufflation was complete, the patient was placed in deep Trendelenburg position. 25 cm above the pubic symphysis that area was marked the camera port. Bilateral robotic ports were marked 10 cm from the midline incision at approximately 5 angle. Under direct visualization each of the trochars was placed into the abdomen. The small bowel was folded on its mesentery to allow visualization to the pelvis.  This was somewhat difficult due to intra-abdominal obesity. The 5 mm LUQ port was then converted to a 10/12 port under direct visualization.  After assuring adequate visualization, the robot was then docked in the usual fashion. Under direct visualization the robotic instruments replaced.   The round ligament on the patient's right side was transected with monopolar cautery. The anterior and posterior leaves of the broad ligament were then taken down in the usual fashion. The ureter was identified on the medial leaf of the broad ligament. A window was made between the IP and the ureter. The IP was coagulated with bipolar cautery and transected. The posterior leaf of the broad ligament was taken down to the level of the KOH ring. The bladder flap was created using meticulous dissection and pinpoint cautery.  The uterine vessels were coagulated with bipolar cautery. The uterine vessels were then transected and the C loop was created. The same procedure was performed on the patient's left side.    The pneumo-occulder in the vagina was then insufflated. The colpotomy was then created in the usual fashion. The specimen was then delivered to the vagina and sent for frozen section. Our attention was then drawn to opening the paravesical space on her right side the perirectal space was also opened. The obturator nerve was identified. The nodal bundle extending over the external iliac artery down to the external iliac vein was taken down using sharp dissection and monopolar cautery. The genitofemoral nerve was identified and spared. We continued our dissection down to the level of the obturator nerve. The nodal bundle superior to the obturator nerve was taken. All pedicles were noted to be hemostatic the ureter was noted to be well medial of the area of dissection. The nodal bundle was then placed and an Endo catch bag. The same procedure was performed on the patient's left side.  The nodes were placed in an Endo catch bag that was marked with one suture.  At this point frozen section returned as minimal myometrial invasion of grade 1 lesion. The decision was made to stop the procedure is no further lymphadenectomy was indicated.   The vaginal cuff was closed with a running 0 Vicryl on CT 1 suture. The abdomen and pelvis were copiously irrigated and noted to be hemostatic. The robotic instruments were removed under direct visualization as were the robotic trochars. The pneumoperitoneum was removed. The patient was then taken out of the Trendelenburg position. Using of 0 Vicryl on a UR 6 needle the midline port port fascia was closed and in the left upper quadrant port site it was reapproximated. The skin was closed using 4-0 Vicryl. Steri-Strips and benzoin were applied. The pneumo occluded balloon was removed from the vagina. The vagina was swabbed and noted to be hemostatic.   All instrument needle and Ray-Tec counts were correct x2. The patient tolerated the procedure well and was taken to the recovery  room in stable condition. This is [facility] dictating an operative note on patient Kristy Santos.

## 2012-01-21 NOTE — Preoperative (Signed)
Beta Blockers   Reason not to administer Beta Blockers:Not Applicable Pt took Beta Blocker 01-21-12 0430

## 2012-01-21 NOTE — Transfer of Care (Signed)
Immediate Anesthesia Transfer of Care Note  Patient: Kristy Santos  Procedure(s) Performed: Procedure(s) (LRB): ROBOTIC ASSISTED TOTAL HYSTERECTOMY WITH BILATERAL SALPINGO OOPHERECTOMY (Bilateral)  Patient Location: PACU  Anesthesia Type: General  Level of Consciousness: sedated, patient cooperative and responds to stimulaton  Airway & Oxygen Therapy: Patient Spontanous Breathing and Patient connected to face mask oxgen  Post-op Assessment: Report given to PACU RN and Post -op Vital signs reviewed and stable  Post vital signs: Reviewed and stable  Complications: No apparent anesthesia complications

## 2012-01-21 NOTE — Anesthesia Postprocedure Evaluation (Signed)
Anesthesia Post Note  Patient: Kristy Santos  Procedure(s) Performed: Procedure(s) (LRB): ROBOTIC ASSISTED TOTAL HYSTERECTOMY WITH BILATERAL SALPINGO OOPHERECTOMY (Bilateral)  Anesthesia type: General  Patient location: PACU  Post pain: Pain level controlled  Post assessment: Post-op Vital signs reviewed  Last Vitals:  Filed Vitals:   01/21/12 1000  BP:   Pulse:   Temp: 36 C  Resp:     Post vital signs: Reviewed  Level of consciousness: sedated  Complications: No apparent anesthesia complications

## 2012-01-21 NOTE — Interval H&P Note (Signed)
History and Physical Interval Note:  01/21/2012 7:09 AM  Kristy Santos  has presented today for surgery, with the diagnosis of endometrial caner  The various methods of treatment have been discussed with the patient and family. After consideration of risks, benefits and other options for treatment, the patient has consented to  Procedure(s) (LRB): ROBOTIC ASSISTED TOTAL HYSTERECTOMY WITH BILATERAL SALPINGO OOPHERECTOMY (Bilateral) as a surgical intervention .  The patient's history has been reviewed, patient examined, no change in status, stable for surgery.  I have reviewed the patient's chart and labs.  Questions were answered to the patient's satisfaction.     Porcupine A.

## 2012-01-21 NOTE — H&P (View-Only) (Signed)
Consult Note: Gyn-Onc  Consult was requested by Dr. Ruthann Cancer for the evaluation of Kristy Santos 61 y.o. female  CC:  Chief Complaint  Patient presents with  . endometrial adenocarcinoma    New consult     HPI:  G1P1 LNMP 10 years ago.  Patient noted vaginal spotting that stopped.  Reported intermittent vaginal spotting since May 2013.  Seen for her annual examination 12/23/2011 and an EMB was collected that is c/w FIGO grade 1 endometrial cancer.  Patient feels wlel, no abdominal pain, no diarrhea no vomiting.  No weight loss, no changes in bladder habits.   Current Meds:  Outpatient Encounter Prescriptions as of 01/07/2012  Medication Sig Dispense Refill  . amLODipine (NORVASC) 5 MG tablet Take 5 mg by mouth 2 (two) times daily.      . fish oil-omega-3 fatty acids 1000 MG capsule Take 1 g by mouth daily.      . furosemide (LASIX) 40 MG tablet Take 40 mg by mouth daily after breakfast.      . glimepiride (AMARYL) 4 MG tablet Take 2 mg by mouth 2 (two) times daily.      Marland Kitchen lisinopril (PRINIVIL,ZESTRIL) 40 MG tablet Take 40 mg by mouth daily after breakfast.      . metFORMIN (GLUCOPHAGE) 1000 MG tablet Take 1,000 mg by mouth 2 (two) times daily with a meal.      . metoprolol succinate (TOPROL-XL) 50 MG 24 hr tablet Take 50 mg by mouth every evening.      . Multiple Vitamin (MULTIVITAMIN WITH MINERALS) TABS Take 1 tablet by mouth daily.        Allergy: No Known Allergies  Social Hx:   History   Social History  . Marital Status: Married    Spouse Name: N/A    Number of Children: N/A  . Years of Education: N/A   Occupational History  . Not on file.   Social History Main Topics  . Smoking status: Not on file  . Smokeless tobacco: Not on file  . Alcohol Use: No  . Drug Use: No  . Sexually Active:    Other Topics Concern  . Not on file   Social History Narrative  . No narrative on file  Last mammogram 12/2011 results pending Colonoscopy 2008 removal of three benign  polyps.  Past Surgical History  Procedure Date  . Breast surgery 2009    right breast-benign  . Refractive surgery 2011    right eye  . Endometrial biopsy 12/23/2011      Past Medical Hx:  Past Medical History  Diagnosis Date  . Hypertension   . Diabetes mellitus   Bronchitis 2011  Past Gynecological History:  G1 P1 Menarche 10, irregular menses until use of OCP. OCP x 11 years.  IUD x 2 years.  No LMP recorded. Patient is postmenopausal. NO h/o abnormal pap test.  NSVD x1  Family History:   Mother breast cancer dx 60 years.    Review of Systems:  Constitutional  Feels very well   Cardiovascular  No chest pain, shortness of breath Pulmonary  No cough or wheeze.  Gastro Intestinal  No nausea, vomitting, or diarrhoea. No bright red blood per rectum, no abdominal pain, change in bowel movement, or constipation.  Genito Urinary  No frequency, urgency, dysuria,vaginal spotting Musculo Skeletal  No myalgia, arthralgia, joint swelling or pain  Neurologic  No weakness, numbness, change in gait,  Psychology  No depression, anxiety, insomnia.   Vitals: BP 148/64  Pulse 78  Temp 98.3 F (36.8 C) (Oral)  Resp 22  Ht 5' 3.5" (1.613 m)  Wt 199 lb (90.266 kg)  BMI 34.70 kg/m2  Physical Exam: WD in NAD Neck  Supple NROM, without any enlargements.  Lymph Node Survey No cervical supraclavicular or inguinal adenopathy Cardiovascular  Pulse normal rate, regularity and rhythm. S1 and S2 normal.  Lungs  Clear to auscultation bilateraly, without wheezes/crackles/rhonchi. Psychiatry  Alert and oriented to person, place, and time  Abdomen  Normoactive bowel sounds, abdomen soft, non-tender and obese.  Back No CVA tenderness Genito Urinary  Vulva/vagina: Normal external female genitalia.  No lesions. No discharge or bleeding.  Bladder/urethra:  No lesions or masses  Vagina: trace blood in the vault.   Cervix: Normal appearing, no lesions.  Uterus: grade 1 uterine  descensus .  Uterus is enlarged with possible lower uterine segment leiomyoma  No parametrial involvement or nodularity.  Adnexa: No palpable masses. Rectal  Good tone, no masses no cul de sac nodularity.  Extremities  No bilateral cyanosis, clubbing or edema.   Assessment/Plan:  Kristy Santos  is a 61 y.o.  year old  with a FIGO grade 1 endometrial adenocarcinoma.  The recommendation was made for surgical staging and open versus minimally invasive endometrial cancer staging was discussed with the patient. The patient opted for minimally invasive robotic approach.  I extensively reviewed the risks of robotic hysterectomy and possible lymph node dissection of infection, bleeding, damage to nearby organs including bowel, bladder, vessels, nerves, and ureters. We discussed postoperative risks including infection, PE/ DVT, and lymphedema. We reviewed possible need for conversion to open laparotomy, need for possible blood transfusion. I discussed positioning during surgery of trendelenberg and risks of minor facial swelling and care we take in preoperative positioning. We also reviewed possible need for further treatment such as radiation or chemotherapy based on final pathology. Expected postoperative recovery was also discussed.  The uterus appears enlarged. A pelvic ultrasound will be obtained to determine the size of the uterus. The patient and her daughter are aware that if the uterus is too large to be delivered vaginally consideration will be made for many laparotomy for extraction of the uterus after completion of the staging procedure.   Kristy Santos is aware that the procedure will occur on Tuesday, 01/01/2012 and that her surgeon will be Dr. Nancy Marus.       Janie Morning, MD, PhD 01/07/2012, 2:58 PM

## 2012-01-21 NOTE — Anesthesia Preprocedure Evaluation (Signed)
Anesthesia Evaluation  Patient identified by MRN, date of birth, ID band Patient awake    Reviewed: Allergy & Precautions, H&P , NPO status , Patient's Chart, lab work & pertinent test results  Airway Mallampati: II TM Distance: >3 FB Neck ROM: Full    Dental  (+) Teeth Intact and Dental Advisory Given   Pulmonary neg pulmonary ROS,  breath sounds clear to auscultation  Pulmonary exam normal       Cardiovascular hypertension, Rhythm:Regular Rate:Normal     Neuro/Psych negative neurological ROS  negative psych ROS   GI/Hepatic negative GI ROS, Neg liver ROS,   Endo/Other  Well Controlled, Type 2, Oral Hypoglycemic Agents  Renal/GU Renal InsufficiencyRenal diseasenegative Renal ROS  negative genitourinary   Musculoskeletal negative musculoskeletal ROS (+)   Abdominal   Peds  Hematology negative hematology ROS (+)   Anesthesia Other Findings   Reproductive/Obstetrics negative OB ROS                           Anesthesia Physical Anesthesia Plan  ASA: II  Anesthesia Plan: General   Post-op Pain Management:    Induction: Intravenous  Airway Management Planned: Oral ETT  Additional Equipment:   Intra-op Plan:   Post-operative Plan: Extubation in OR  Informed Consent: I have reviewed the patients History and Physical, chart, labs and discussed the procedure including the risks, benefits and alternatives for the proposed anesthesia with the patient or authorized representative who has indicated his/her understanding and acceptance.   Dental advisory given  Plan Discussed with: CRNA  Anesthesia Plan Comments:         Anesthesia Quick Evaluation

## 2012-01-22 LAB — BASIC METABOLIC PANEL
BUN: 26 mg/dL — ABNORMAL HIGH (ref 6–23)
Chloride: 102 mEq/L (ref 96–112)
Creatinine, Ser: 1.84 mg/dL — ABNORMAL HIGH (ref 0.50–1.10)
Glucose, Bld: 68 mg/dL — ABNORMAL LOW (ref 70–99)
Potassium: 3.4 mEq/L — ABNORMAL LOW (ref 3.5–5.1)

## 2012-01-22 LAB — CBC
HCT: 27.2 % — ABNORMAL LOW (ref 36.0–46.0)
Hemoglobin: 9.5 g/dL — ABNORMAL LOW (ref 12.0–15.0)
MCV: 79.8 fL (ref 78.0–100.0)
RDW: 13.2 % (ref 11.5–15.5)
WBC: 7.1 10*3/uL (ref 4.0–10.5)

## 2012-01-22 MED ORDER — OXYCODONE-ACETAMINOPHEN 5-325 MG PO TABS: 1.0000 | ORAL_TABLET | ORAL | Status: AC | PRN

## 2012-01-22 MED ORDER — INSULIN ASPART 100 UNIT/ML ~~LOC~~ SOLN: 0.0000 [IU] | SUBCUTANEOUS | Status: DC

## 2012-01-22 MED FILL — Sodium Chloride Inj 0.9%: INTRAMUSCULAR | Qty: 10 | Status: AC

## 2012-01-22 NOTE — Progress Notes (Signed)
PHARMACIST - PHYSICIAN COMMUNICATION CONCERNING:  METFORMIN SAFE ADMINISTRATION POLICY  RECOMMENDATION: Metformin has been placed on DISCONTINUE (rejected order) STATUS and should be reordered only after any of the conditions below are ruled out.  Current safety recommendations include avoiding metformin for a minimum of 48 hours after the patient's exposure to intravenous contrast media.  DESCRIPTION:  The Pharmacy Committee has adopted a policy that restricts the use of metformin in hospitalized patients until all the contraindications to administration have been ruled out. Specific contraindications are: []  Serum creatinine ? 1.5 for males [x]  Serum creatinine ? 1.4 for females []  Shock, acute MI, sepsis, hypoxemia, dehydration []  Planned administration of intravenous iodinated contrast media []  Heart Failure patients with low EF []  Acute or chronic metabolic acidosis (including DKA)     Vanessa Lavaca, PharmD, BCPS Pager: [phone] 8:10 AM Pharmacy #: [phone]

## 2012-01-22 NOTE — Progress Notes (Signed)
Pt has ambulated the entire unit x2 all shit and tolerated well each time. Foley cathter removed as ordered. Pt denies any pain or discomfort at this time. Awaiting for her to void.

## 2012-01-22 NOTE — Discharge Summary (Signed)
Physician Discharge Summary  Patient ID: RUAH NOETH MRN: [id number] DOB/AGE: [birth year] 61 y.o.  Admit date: 01/21/2012 Discharge date: 01/22/2012  Admission Diagnoses: Endometrial cancer  Discharge Diagnoses:  Principal Problem:  *Endometrial cancer  Discharged Condition: stable  Hospital Course: On 01/21/2012, the patient underwent the following: Procedure(s): ROBOTIC ASSISTED TOTAL HYSTERECTOMY WITH BILATERAL SALPINGO OOPHERECTOMY.   The postoperative course was uneventful.  She was discharged to home on postoperative day 1 tolerating a regular diet.  Consults: None  Significant Diagnostic Studies: None  Treatments: surgery: See above  Discharge Exam: Blood pressure 137/61, pulse 61, temperature 98.2 F (36.8 C), temperature source Oral, resp. rate 18, height 5\' 3"  (1.6 m), weight 209 lb 3.5 oz (94.9 kg), SpO2 99.00%. General appearance: alert, cooperative and no distress Resp: clear to auscultation bilaterally Cardio: regular rate and rhythm, S1, S2 normal, no murmur, click, rub or gallop GI: soft, non-tender; bowel sounds normal; no masses,  no organomegaly Extremities: extremities normal, atraumatic, no cyanosis or edema Incision/Wound: Lap sites with steri strips clean, dry, and intact  Disposition: Final discharge disposition not confirmed  Discharge Orders    Future Orders Please Complete By Expires   Diet - low sodium heart healthy      Increase activity slowly      Driving Restrictions      Comments:   No driving for 1 week.  Do not take narcotics and drive.   Lifting restrictions      Comments:   No lifting greater than 10 lbs.   Sexual Activity Restrictions      Comments:   No sexual activity, nothing in the vagina, for 8 weeks.   Call MD for:  temperature >100.4      Call MD for:  persistant nausea and vomiting      Call MD for:  severe uncontrolled pain      Call MD for:  redness, tenderness, or signs of infection (pain, swelling, redness, odor or  green/yellow discharge around incision site)      Call MD for:  difficulty breathing, headache or visual disturbances      Call MD for:  hives      Call MD for:  persistant dizziness or light-headedness      Call MD for:  extreme fatigue        Medication List  As of 01/22/2012  9:53 AM   TAKE these medications         amLODipine 5 MG tablet   Commonly known as: NORVASC   Take 5 mg by mouth 2 (two) times daily.      fish oil-omega-3 fatty acids 1000 MG capsule   Take 1 g by mouth daily.      furosemide 40 MG tablet   Commonly known as: LASIX   Take 40 mg by mouth daily after breakfast.      glimepiride 4 MG tablet   Commonly known as: AMARYL   Take 2 mg by mouth 2 (two) times daily.      lisinopril 40 MG tablet   Commonly known as: PRINIVIL,ZESTRIL   Take 40 mg by mouth daily after breakfast.      metFORMIN 1000 MG tablet   Commonly known as: GLUCOPHAGE   Take 1,000 mg by mouth 2 (two) times daily with a meal.      metoprolol succinate 50 MG 24 hr tablet   Commonly known as: TOPROL-XL   Take 50 mg by mouth every evening.      multivitamin  with minerals Tabs   Take 1 tablet by mouth daily.      oxyCODONE-acetaminophen 5-325 MG per tablet   Commonly known as: PERCOCET/ROXICET   Take 1-2 tablets by mouth every 4 (four) hours as needed (moderate to severe pain (when tolerating fluids)).           Follow-up Information    Please follow up. (GYN Oncology office to call with final path report and follow up appointment [phone])          Signed: Shaelee Forni DEAL 01/22/2012, 9:53 AM

## 2012-01-23 ENCOUNTER — Telehealth: Payer: Self-pay | Admitting: *Deleted

## 2012-01-23 NOTE — Telephone Encounter (Signed)
Patient notified of Path results.

## 2012-03-04 ENCOUNTER — Encounter: Payer: Self-pay | Admitting: Gynecologic Oncology

## 2012-03-04 ENCOUNTER — Ambulatory Visit: Payer: 59 | Attending: Gynecologic Oncology | Admitting: Gynecologic Oncology

## 2012-03-04 VITALS — BP 160/84 | HR 74 | Temp 98.1°F | Resp 18 | Ht 63.5 in | Wt 196.6 lb

## 2012-03-04 DIAGNOSIS — E119 Type 2 diabetes mellitus without complications: Secondary | ICD-10-CM | POA: Insufficient documentation

## 2012-03-04 DIAGNOSIS — C549 Malignant neoplasm of corpus uteri, unspecified: Secondary | ICD-10-CM | POA: Insufficient documentation

## 2012-03-04 DIAGNOSIS — Z9079 Acquired absence of other genital organ(s): Secondary | ICD-10-CM | POA: Insufficient documentation

## 2012-03-04 DIAGNOSIS — I10 Essential (primary) hypertension: Secondary | ICD-10-CM | POA: Insufficient documentation

## 2012-03-04 DIAGNOSIS — Z90710 Acquired absence of both cervix and uterus: Secondary | ICD-10-CM | POA: Insufficient documentation

## 2012-03-04 DIAGNOSIS — Z79899 Other long term (current) drug therapy: Secondary | ICD-10-CM | POA: Insufficient documentation

## 2012-03-04 DIAGNOSIS — C541 Malignant neoplasm of endometrium: Secondary | ICD-10-CM

## 2012-03-04 NOTE — Patient Instructions (Signed)
No intercourse for 2 additional weeks.

## 2012-03-04 NOTE — Progress Notes (Signed)
Consult Note: Gyn-Onc  [name] 61 y.o. female  CC:  Chief Complaint  Patient presents with  . Endometrial cancer    Follow up    HPI: G1P1 LNMP 10 years ago. Patient noted vaginal spotting that stopped. Reported intermittent vaginal spotting since May 2013. Seen for her annual examination 12/23/2011 and an EMB was collected that is c/w FIGO grade 1 endometrial cancer. Patient feels wlel, no abdominal pain, no diarrhea no vomiting. No weight loss, no changes in bladder habits.   Interval History:  On 01/21/2012 she underwent a robotic hysterectomy BSO bilateral pelvic lymph node dissection. Operative findings included a globular uterus with 2-3 cm myomas. There is a normal appearing adnexa. Frozen section revealed a grade 1 endometrial carcinoma with minimal myometrial invasion therefore the lymph nodes were stopped after the pelvic nodes. Final pathology revealed an invasive endometrioid carcinoma grade 2 confined within the inner half of the myometrium to 1 cm with the myometrium was 2.6 cm in thickness are 20%. The tubes and ovaries bilaterally were negative. 0/16 lymph nodes were involved. There was no lymphovascular space involvement.  She comes in today for her postoperative check  She is back to work. She did have a small amount of clear white discharge for a few weeks but that is stop she's had no bleeding. She denies any pain. She has a change about bladder habits. She does use diuretics and has urinary frequency a related to her diuretics but had no problems. Review of Systems  Current Meds:  Outpatient Encounter Prescriptions as of 03/04/2012  Medication Sig Dispense Refill  . amLODipine (NORVASC) 5 MG tablet Take 5 mg by mouth 2 (two) times daily.      . fish oil-omega-3 fatty acids 1000 MG capsule Take 1 g by mouth daily.      . furosemide (LASIX) 40 MG tablet Take 40 mg by mouth daily after breakfast.      . glimepiride (AMARYL) 4 MG tablet Take 2 mg by mouth 2 (two)  times daily.      Marland Kitchen lisinopril (PRINIVIL,ZESTRIL) 40 MG tablet Take 40 mg by mouth daily after breakfast.      . metFORMIN (GLUCOPHAGE) 1000 MG tablet Take 1,000 mg by mouth 2 (two) times daily with a meal.      . metoprolol succinate (TOPROL-XL) 50 MG 24 hr tablet Take 50 mg by mouth every evening.      . Multiple Vitamin (MULTIVITAMIN WITH MINERALS) TABS Take 1 tablet by mouth daily.        Allergy: No Known Allergies  Social Hx:   History   Social History  . Marital Status: Married    Spouse Name: N/A    Number of Children: N/A  . Years of Education: N/A   Occupational History  . Not on file.   Social History Main Topics  . Smoking status: Never Smoker   . Smokeless tobacco: Never Used   Comment: never used  . Alcohol Use: No  . Drug Use: No  . Sexually Active:    Other Topics Concern  . Not on file   Social History Narrative  . No narrative on file    Past Surgical Hx:  Past Surgical History  Procedure Date  . Breast surgery 2009    right breast-benign  . Refractive surgery 2011    right eye  . Endometrial biopsy 12/23/2011  . Total abdominal hysterectomy w/ bilateral salpingoophorectomy 01/21/2012    w/ LND    Past  Medical Hx:  Past Medical History  Diagnosis Date  . Hypertension   . Diabetes mellitus     Family Hx: No family history on file.  Vitals:  Blood pressure 160/84, pulse 74, temperature 98.1 F (36.7 C), temperature source Oral, resp. rate 18, height 5' 3.5" (1.613 m), weight 196 lb 9.6 oz (89.177 kg).  Physical Exam:  Well-nourished well-developed female in no acute distress.  Abdomen: Well-healed surgical incisions. Abdomen is soft and nontender. There is no appreciable hernias.  Pelvic: Normal external female genitalia. The vaginal cuff is healing well. Suture line is intact. Sutures are visible. Bimanual examination reveals no masses cuff tenderness.  Assessment/Plan: 61 year old with a stage IA grade 2 endometrioid  adenocarcinoma. She does not meet criteria for vaginal cuff brachii therapy. She is less than 20% myometrial invasion, and no lymphovascular space involvement, and 0/16 lymph nodes were involved. We discussed the followup plan she will return to see [place] in 4 months and we'll begin alternating visits with Dr. Ruthann Cancer. We will see her every 4 months between our service and Dr. Ruthann Cancer for the first 2 years and then every 6 months for 3 years after that.  Raden Byington A., MD 03/04/2012, 8:46 AM

## 2012-10-01 ENCOUNTER — Encounter: Payer: Self-pay | Admitting: Gynecologic Oncology

## 2012-10-01 ENCOUNTER — Ambulatory Visit: Payer: 59 | Attending: Gynecologic Oncology | Admitting: Gynecologic Oncology

## 2012-10-01 VITALS — BP 164/70 | HR 100 | Temp 98.6°F | Resp 20 | Ht 63.5 in | Wt 194.1 lb

## 2012-10-01 DIAGNOSIS — Z90710 Acquired absence of both cervix and uterus: Secondary | ICD-10-CM | POA: Insufficient documentation

## 2012-10-01 DIAGNOSIS — C541 Malignant neoplasm of endometrium: Secondary | ICD-10-CM

## 2012-10-01 DIAGNOSIS — E119 Type 2 diabetes mellitus without complications: Secondary | ICD-10-CM | POA: Insufficient documentation

## 2012-10-01 DIAGNOSIS — C549 Malignant neoplasm of corpus uteri, unspecified: Secondary | ICD-10-CM | POA: Insufficient documentation

## 2012-10-01 DIAGNOSIS — I10 Essential (primary) hypertension: Secondary | ICD-10-CM | POA: Insufficient documentation

## 2012-10-01 NOTE — Progress Notes (Signed)
Consult Note: Gyn-Onc  Kristy Santos 62 y.o. female  CC:  Chief Complaint  Patient presents with  . Endometrial Cancer    Follow up    HPI: 62 year old G67P1 with LMP10 years ago. Patient noted vaginal spotting that stopped. Reported intermittent vaginal spotting since May 2013. Seen for her annual examination 12/23/2011 and an EMB was collected that is c/w FIGO grade 1 endometrial cancer. Patient feels wlel, no abdominal pain, no diarrhea no vomiting. No weight loss, no changes in bladder habits.   Interval History:  On 01/21/2012 she underwent a robotic hysterectomy BSO bilateral pelvic lymph node dissection. Operative findings included a globular uterus with 2-3 cm myomas. There is a normal appearing adnexa. Frozen section revealed a grade 1 endometrial carcinoma with minimal myometrial invasion therefore the lymph nodes were stopped after the pelvic nodes. Final pathology revealed an invasive endometrioid carcinoma grade 2 confined within the inner half of the myometrium to 1 cm with the myometrium was 2.6 cm in thickness are 20%. The tubes and ovaries bilaterally were negative. 0/16 lymph nodes were involved. There was no lymphovascular space involvement.  She comes in today for her postoperative check  Diagnosis 1. Uterus +/- tubes/ovaries, neoplastic - INVASIVE ENDOMETRIOID CARCINOMA, FIGO GRADE II, CONFINED WITHIN INNER HALF OF THE MYOMETRIUM. - LEIOMYOMATA AND ADENOMYOSIS. - CERVIX: BENIGN SQUAMOUS MUCOSA AND ENDOCERVICAL MUCOSA, NO DYSPLASIA OR MALIGNANCY. - BILATERAL OVARIES AND FALLOPIAN TUBES: NO PATHOLOGIC ABNORMALITIES. 2. Lymph nodes, regional resection, right pelvis - EIGHT LYMPH NODES, NEGATIVE FOR METASTATIC CARCINOMA (0/8). 3. Lymph nodes, regional resection, left pelvic - EIGHT LYMPH NODES, NEGATIVE FOR METASTATIC CARCINOMA (0/8).  Review of Systems She is doing very well. She is feeling well, she voids frequently due to diuretics. FBS 120-130. She has lost about  5# since we last saw her.  This has been intentional. She denies any bleeding, change in bowel or bladder habits, abdominal or pelvic pain, n/v/f/c. She is UTD on her MMG. Her daughter is about [redacted] weeks pregnant with her first child and is expecting a  Little boy in 9/14. Her pregnancy is complicated by fibroids.  Current Meds:  Outpatient Encounter Prescriptions as of 10/01/2012  Medication Sig Dispense Refill  . amLODipine (NORVASC) 5 MG tablet Take 5 mg by mouth 2 (two) times daily.      . furosemide (LASIX) 20 MG tablet Take 20 mg by mouth daily.      Marland Kitchen glimepiride (AMARYL) 4 MG tablet Take 2 mg by mouth 2 (two) times daily.      Marland Kitchen lisinopril (PRINIVIL,ZESTRIL) 40 MG tablet Take 40 mg by mouth daily after breakfast.      . metFORMIN (GLUCOPHAGE) 1000 MG tablet Take 1,000 mg by mouth 2 (two) times daily with a meal.      . metoprolol succinate (TOPROL-XL) 50 MG 24 hr tablet Take 50 mg by mouth every evening.      . Multiple Vitamin (MULTIVITAMIN WITH MINERALS) TABS Take 1 tablet by mouth daily.      . fish oil-omega-3 fatty acids 1000 MG capsule Take 1 g by mouth daily.      . [DISCONTINUED] furosemide (LASIX) 40 MG tablet Take 40 mg by mouth daily after breakfast.       No facility-administered encounter medications on file as of 10/01/2012.    Allergy: No Known Allergies  Social Hx:   History   Social History  . Marital Status: Married    Spouse Name: N/A    Number  of Children: N/A  . Years of Education: N/A   Occupational History  . Not on file.   Social History Main Topics  . Smoking status: Never Smoker   . Smokeless tobacco: Never Used     Comment: never used  . Alcohol Use: No  . Drug Use: No  . Sexually Active:    Other Topics Concern  . Not on file   Social History Narrative  . No narrative on file    Past Surgical Hx:  Past Surgical History  Procedure Laterality Date  . Breast surgery  2009    right breast-benign  . Refractive surgery  2011    right eye   . Endometrial biopsy  12/23/2011  . Total abdominal hysterectomy w/ bilateral salpingoophorectomy  01/21/2012    w/ LND    Past Medical Hx:  Past Medical History  Diagnosis Date  . Hypertension   . Diabetes mellitus     Family Hx: History reviewed. No pertinent family history.  Vitals:  Blood pressure 164/70, pulse 100, temperature 98.6 F (37 C), resp. rate 20, height 5' 3.5" (1.613 m), weight 194 lb 1.6 oz (88.043 kg).  Physical Exam:  Well-nourished well-developed female in no acute distress.  Neck: No lymphadenopathy no thyromegaly.  Lungs: Clear to auscultation bilaterally.  Cardiovascular: Regular rate and rhythm.  Abdomen: Well-healed surgical incisions. Abdomen is soft and nontender. There is no appreciable hernias.  Groins: No lymphadenopathy.  Extremities: No edema.  Pelvic: Normal external female genitalia. Vagina and vaginal cuff without lesions. Bimanual examination reveals no masses or nodularity. Rectal confirms.  Assessment/Plan: 62 year old with a stage IA grade 2 endometrioid adenocarcinoma. She is doing very well. She was congratulated on her weight loss. She will followup with Dr. Ruthann Cancer in 4 months return to see [place] in 8 months. She'll need a Pap smear at the time of her visit around August or September of this year.  Kavita Bartl A., MD 10/01/2012, 9:26 AM

## 2012-10-01 NOTE — Patient Instructions (Signed)
Exercise to Lose Weight Exercise and a healthy diet may help you lose weight. Your doctor may suggest specific exercises. EXERCISE IDEAS AND TIPS  Choose low-cost things you enjoy doing, such as walking, bicycling, or exercising to workout videos.  Take stairs instead of the elevator.  Walk during your lunch break.  Park your car further away from work or school.  Go to a gym or an exercise class.  Start with 5 to 10 minutes of exercise each day. Build up to 30 minutes of exercise 4 to 6 days a week.  Wear shoes with good support and comfortable clothes.  Stretch before and after working out.  Work out until you breathe harder and your heart beats faster.  Drink extra water when you exercise.  Do not do so much that you hurt yourself, feel dizzy, or get very short of breath. Exercises that burn about 150 calories:  Running 1  miles in 15 minutes.  Playing volleyball for 45 to 60 minutes.  Washing and waxing a car for 45 to 60 minutes.  Playing touch football for 45 minutes.  Walking 1  miles in 35 minutes.  Pushing a stroller 1  miles in 30 minutes.  Playing basketball for 30 minutes.  Raking leaves for 30 minutes.  Bicycling 5 miles in 30 minutes.  Walking 2 miles in 30 minutes.  Dancing for 30 minutes.  Shoveling snow for 15 minutes.  Swimming laps for 20 minutes.  Walking up stairs for 15 minutes.  Bicycling 4 miles in 15 minutes.  Gardening for 30 to 45 minutes.  Jumping rope for 15 minutes.  Washing windows or floors for 45 to 60 minutes. Document Released: 06/15/2010 Document Revised: 08/05/2011 Document Reviewed: 06/15/2010 [facility] Patient Information 2013 [place]. Calorie Counting Diet A calorie counting diet requires you to eat the number of calories that are right for you in a day. Calories are the measurement of how much energy you get from the food you eat. Eating the right amount of calories is important for staying at a  healthy weight. If you eat too many calories, your body will store them as fat and you may gain weight. If you eat too few calories, you may lose weight. Counting the number of calories you eat during a day will help you know if you are eating the right amount. A Registered Dietitian can determine how many calories you need in a day. The amount of calories needed varies from person to person. If your goal is to lose weight, you will need to eat fewer calories. Losing weight can benefit you if you are overweight or have health problems such as heart disease, high blood pressure, or diabetes. If your goal is to gain weight, you will need to eat more calories. Gaining weight may be necessary if you have a certain health problem that causes your body to need more energy. TIPS Whether you are increasing or decreasing the number of calories you eat during a day, it may be hard to get used to changes in what you eat and drink. The following are tips to help you keep track of the number of calories you eat.  Measure foods at home with measuring cups. This helps you know the amount of food and number of calories you are eating.  Restaurants often serve food in amounts that are larger than 1 serving. While eating out, estimate how many servings of a food you are given. For example, a serving of cooked rice  is  cup or about the size of half of a fist. Knowing serving sizes will help you be aware of how much food you are eating at restaurants.  Ask for smaller portion sizes or child-size portions at restaurants.  Plan to eat half of a meal at a restaurant. Take the rest home or share the other half with a friend.  Read the Nutrition Facts panel on food labels for calorie content and serving size. You can find out how many servings are in a package, the size of a serving, and the number of calories each serving has.  For example, a package might contain 3 cookies. The Nutrition Facts panel on that package says  that 1 serving is 1 cookie. Below that, it will say there are 3 servings in the container. The calories section of the Nutrition Facts label says there are 90 calories. This means there are 90 calories in 1 cookie (1 serving). If you eat 1 cookie you have eaten 90 calories. If you eat all 3 cookies, you have eaten 270 calories (3 servings x 90 calories = 270 calories). The list below tells you how big or small some common portion sizes are.  1 oz.........4 stacked dice.  3 oz........Marland KitchenDeck of cards.  1 tsp.......Marland KitchenTip of little finger.  1 tbs......Marland KitchenMarland KitchenThumb.  2 tbs.......Marland KitchenGolf ball.   cup......Marland KitchenHalf of a fist.  1 cup.......Marland KitchenA fist. KEEP A FOOD LOG Write down every food item you eat, the amount you eat, and the number of calories in each food you eat during the day. At the end of the day, you can add up the total number of calories you have eaten. It may help to keep a list like the one below. Find out the calorie information by reading the Nutrition Facts panel on food labels. Breakfast  Bran cereal (1 cup, 110 calories).  Fat-free milk ( cup, 45 calories). Snack  Apple (1 medium, 80 calories). Lunch  Spinach (1 cup, 20 calories).  Tomato ( medium, 20 calories).  Chicken breast strips (3 oz, 165 calories).  Shredded cheddar cheese ( cup, 110 calories).  Light [place] dressing (2 tbs, 60 calories).  Whole-wheat bread (1 slice, 80 calories).  Tub margarine (1 tsp, 35 calories).  Vegetable soup (1 cup, 160 calories). Dinner  Pork chop (3 oz, 190 calories).  Brown rice (1 cup, 215 calories).  Steamed broccoli ( cup, 20 calories).  Strawberries (1  cup, 65 calories).  Whipped cream (1 tbs, 50 calories). Daily Calorie Total: [id number] Document Released: 05/13/2005 Document Revised: 08/05/2011 Document Reviewed: 11/07/2006 [facility] Patient Information 2013 [place].

## 2013-03-24 ENCOUNTER — Other Ambulatory Visit: Payer: Self-pay

## 2013-03-24 DIAGNOSIS — Z1231 Encounter for screening mammogram for malignant neoplasm of breast: Secondary | ICD-10-CM

## 2013-04-23 ENCOUNTER — Ambulatory Visit
Admission: RE | Admit: 2013-04-23 | Discharge: 2013-04-23 | Disposition: A | Discharge status: Home / Self Care | Payer: 59 | Source: Ambulatory Visit

## 2013-04-23 ENCOUNTER — Ambulatory Visit: Payer: 59

## 2013-04-23 DIAGNOSIS — Z1231 Encounter for screening mammogram for malignant neoplasm of breast: Secondary | ICD-10-CM

## 2014-06-20 ENCOUNTER — Other Ambulatory Visit: Payer: Self-pay

## 2014-06-20 DIAGNOSIS — Z1231 Encounter for screening mammogram for malignant neoplasm of breast: Secondary | ICD-10-CM

## 2014-06-21 ENCOUNTER — Ambulatory Visit
Admission: RE | Admit: 2014-06-21 | Discharge: 2014-06-21 | Disposition: A | Discharge status: Home / Self Care | Payer: 59 | Source: Ambulatory Visit

## 2014-06-21 DIAGNOSIS — Z1231 Encounter for screening mammogram for malignant neoplasm of breast: Secondary | ICD-10-CM

## 2015-04-25 ENCOUNTER — Other Ambulatory Visit: Payer: Self-pay | Admitting: Cardiology

## 2015-04-25 DIAGNOSIS — N289 Disorder of kidney and ureter, unspecified: Secondary | ICD-10-CM

## 2015-04-28 ENCOUNTER — Ambulatory Visit
Admission: RE | Admit: 2015-04-28 | Discharge: 2015-04-28 | Disposition: A | Discharge status: Home / Self Care | Payer: 59 | Source: Ambulatory Visit | Attending: Cardiology | Admitting: Cardiology

## 2015-04-28 DIAGNOSIS — N289 Disorder of kidney and ureter, unspecified: Secondary | ICD-10-CM

## 2015-05-11 ENCOUNTER — Encounter (HOSPITAL_COMMUNITY): Payer: 59

## 2015-05-11 NOTE — Discharge Instructions (Signed)
Epoetin Alfa injection What is this medicine? EPOETIN ALFA (e POE e tin AL fa) helps your body make more red blood cells. This medicine is used to treat anemia caused by chronic kidney failure, cancer chemotherapy, or HIV-therapy. It may also be used before surgery if you have anemia. This medicine may be used for other purposes; ask your health care provider or pharmacist if you have questions. What should I tell my health care provider before I take this medicine? They need to know if you have any of these conditions: -blood clotting disorders -cancer patient not on chemotherapy -cystic fibrosis -heart disease, such as angina or heart failure -hemoglobin level of 12 g/dL or greater -high blood pressure -low levels of folate, iron, or vitamin B12 -seizures -an unusual or allergic reaction to erythropoietin, albumin, benzyl alcohol, hamster proteins, other medicines, foods, dyes, or preservatives -pregnant or trying to get pregnant -breast-feeding How should I use this medicine? This medicine is for injection into a vein or under the skin. It is usually given by a health care professional in a hospital or clinic setting. If you get this medicine at home, you will be taught how to prepare and give this medicine. Use exactly as directed. Take your medicine at regular intervals. Do not take your medicine more often than directed. It is important that you put your used needles and syringes in a special sharps container. Do not put them in a trash can. If you do not have a sharps container, call your pharmacist or healthcare provider to get one. Talk to your pediatrician regarding the use of this medicine in children. While this drug may be prescribed for selected conditions, precautions do apply. Overdosage: If you think you have taken too much of this medicine contact a poison control center or emergency room at once. NOTE: This medicine is only for you. Do not share this medicine with  others. What if I miss a dose? If you miss a dose, take it as soon as you can. If it is almost time for your next dose, take only that dose. Do not take double or extra doses. What may interact with this medicine? Do not take this medicine with any of the following medications: -darbepoetin alfa This list may not describe all possible interactions. Give your health care provider a list of all the medicines, herbs, non-prescription drugs, or dietary supplements you use. Also tell them if you smoke, drink alcohol, or use illegal drugs. Some items may interact with your medicine. What should I watch for while using this medicine? Visit your prescriber or health care professional for regular checks on your progress and for the needed blood tests and blood pressure measurements. It is especially important for the doctor to make sure your hemoglobin level is in the desired range, to limit the risk of potential side effects and to give you the best benefit. Keep all appointments for any recommended tests. Check your blood pressure as directed. Ask your doctor what your blood pressure should be and when you should contact him or her. As your body makes more red blood cells, you may need to take iron, folic acid, or vitamin B supplements. Ask your doctor or health care provider which products are right for you. If you have kidney disease continue dietary restrictions, even though this medication can make you feel better. Talk with your doctor or health care professional about the foods you eat and the vitamins that you take. What side effects may I notice   from receiving this medicine? Side effects that you should report to your doctor or health care professional as soon as possible: -allergic reactions like skin rash, itching or hives, swelling of the face, lips, or tongue -breathing problems -changes in vision -chest pain -confusion, trouble speaking or understanding -feeling faint or lightheaded,  falls -high blood pressure -muscle aches or pains -pain, swelling, warmth in the leg -rapid weight gain -severe headaches -sudden numbness or weakness of the face, arm or leg -trouble walking, dizziness, loss of balance or coordination -seizures (convulsions) -swelling of the ankles, feet, hands -unusually weak or tired Side effects that usually do not require medical attention (report to your doctor or health care professional if they continue or are bothersome): -diarrhea -fever, chills (flu-like symptoms) -headaches -nausea, vomiting -redness, stinging, or swelling at site where injected This list may not describe all possible side effects. Call your doctor for medical advice about side effects. You may report side effects to FDA at 1-800-FDA-1088. Where should I keep my medicine? Keep out of the reach of children. Store in a refrigerator between 2 and 8 degrees C (36 and 46 degrees F). Do not freeze or shake. Throw away any unused portion if using a single-dose vial. Multi-dose vials can be kept in the refrigerator for up to 21 days after the initial dose. Throw away unused medicine. NOTE: This sheet is a summary. It may not cover all possible information. If you have questions about this medicine, talk to your doctor, pharmacist, or health care provider.    2016, Elsevier/Gold Standard. (2008-04-26 10:25:44)  

## 2015-05-12 ENCOUNTER — Encounter (HOSPITAL_COMMUNITY)
Admission: RE | Admit: 2015-05-12 | Discharge: 2015-05-12 | Disposition: A | Discharge status: Home / Self Care | Payer: 59 | Source: Ambulatory Visit | Attending: Nephrology | Admitting: Nephrology

## 2015-05-12 DIAGNOSIS — Z79899 Other long term (current) drug therapy: Secondary | ICD-10-CM | POA: Insufficient documentation

## 2015-05-12 DIAGNOSIS — N183 Chronic kidney disease, stage 3 (moderate): Secondary | ICD-10-CM | POA: Insufficient documentation

## 2015-05-12 DIAGNOSIS — D631 Anemia in chronic kidney disease: Secondary | ICD-10-CM | POA: Diagnosis not present

## 2015-05-12 DIAGNOSIS — Z5181 Encounter for therapeutic drug level monitoring: Secondary | ICD-10-CM | POA: Insufficient documentation

## 2015-05-12 LAB — FERRITIN: FERRITIN: 197 ng/mL (ref 11–307)

## 2015-05-12 LAB — IRON AND TIBC
IRON: 66 ug/dL (ref 28–170)
Saturation Ratios: 20 % (ref 10.4–31.8)
TIBC: 323 ug/dL (ref 250–450)
UIBC: 257 ug/dL

## 2015-05-12 LAB — POCT HEMOGLOBIN-HEMACUE: Hemoglobin: 8.7 g/dL — ABNORMAL LOW (ref 12.0–15.0)

## 2015-05-12 MED ORDER — EPOETIN ALFA 20000 UNIT/ML IJ SOLN
20000.0000 [IU] | INTRAMUSCULAR | Status: DC
  Administered 2015-05-12: 20000 [IU] via SUBCUTANEOUS

## 2015-05-12 MED ORDER — EPOETIN ALFA 20000 UNIT/ML IJ SOLN
INTRAMUSCULAR | Status: AC
  Filled 2015-05-12: qty 1

## 2015-05-26 ENCOUNTER — Encounter (HOSPITAL_COMMUNITY)
Admission: RE | Admit: 2015-05-26 | Discharge: 2015-05-26 | Disposition: A | Discharge status: Home / Self Care | Payer: 59 | Source: Ambulatory Visit | Attending: Nephrology | Admitting: Nephrology

## 2015-05-26 DIAGNOSIS — N183 Chronic kidney disease, stage 3 (moderate): Secondary | ICD-10-CM | POA: Diagnosis not present

## 2015-05-26 LAB — RENAL FUNCTION PANEL
ALBUMIN: 3.2 g/dL — AB (ref 3.5–5.0)
Anion gap: 10 (ref 5–15)
BUN: 75 mg/dL — ABNORMAL HIGH (ref 6–20)
CALCIUM: 8.8 mg/dL — AB (ref 8.9–10.3)
CO2: 20 mmol/L — AB (ref 22–32)
CREATININE: 4.95 mg/dL — AB (ref 0.44–1.00)
Chloride: 110 mmol/L (ref 101–111)
GFR, EST AFRICAN AMERICAN: 10 mL/min — AB (ref >60)
GFR, EST NON AFRICAN AMERICAN: 8 mL/min — AB (ref >60)
Glucose, Bld: 150 mg/dL — ABNORMAL HIGH (ref 65–99)
PHOSPHORUS: 5.1 mg/dL — AB (ref 2.5–4.6)
Potassium: 4.4 mmol/L (ref 3.5–5.1)
SODIUM: 140 mmol/L (ref 135–145)

## 2015-05-26 MED ORDER — EPOETIN ALFA 20000 UNIT/ML IJ SOLN
INTRAMUSCULAR | Status: AC
  Filled 2015-05-26: qty 1

## 2015-05-26 MED ORDER — EPOETIN ALFA 20000 UNIT/ML IJ SOLN
20000.0000 [IU] | INTRAMUSCULAR | Status: DC
  Administered 2015-05-26: 20000 [IU] via SUBCUTANEOUS

## 2015-05-30 LAB — POCT HEMOGLOBIN-HEMACUE: HEMOGLOBIN: 9.8 g/dL — AB (ref 12.0–15.0)

## 2015-06-09 ENCOUNTER — Encounter (HOSPITAL_COMMUNITY)
Admission: RE | Admit: 2015-06-09 | Discharge: 2015-06-09 | Disposition: A | Discharge status: Home / Self Care | Payer: 59 | Source: Ambulatory Visit | Attending: Nephrology | Admitting: Nephrology

## 2015-06-09 DIAGNOSIS — Z5181 Encounter for therapeutic drug level monitoring: Secondary | ICD-10-CM | POA: Insufficient documentation

## 2015-06-09 DIAGNOSIS — N183 Chronic kidney disease, stage 3 (moderate): Secondary | ICD-10-CM | POA: Insufficient documentation

## 2015-06-09 DIAGNOSIS — Z79899 Other long term (current) drug therapy: Secondary | ICD-10-CM | POA: Diagnosis not present

## 2015-06-09 DIAGNOSIS — D631 Anemia in chronic kidney disease: Secondary | ICD-10-CM | POA: Diagnosis not present

## 2015-06-09 LAB — FERRITIN: Ferritin: 102 ng/mL (ref 11–307)

## 2015-06-09 LAB — IRON AND TIBC
IRON: 38 ug/dL (ref 28–170)
SATURATION RATIOS: 12 % (ref 10.4–31.8)
TIBC: 319 ug/dL (ref 250–450)
UIBC: 281 ug/dL

## 2015-06-09 LAB — POCT HEMOGLOBIN-HEMACUE: Hemoglobin: 10.7 g/dL — ABNORMAL LOW (ref 12.0–15.0)

## 2015-06-09 MED ORDER — EPOETIN ALFA 20000 UNIT/ML IJ SOLN: 20000.0000 [IU] | INTRAMUSCULAR | Status: DC

## 2015-06-09 MED ORDER — EPOETIN ALFA 20000 UNIT/ML IJ SOLN
INTRAMUSCULAR | Status: AC
  Administered 2015-06-09: 20000 [IU]
  Filled 2015-06-09: qty 1

## 2015-06-09 MED FILL — glyBURIDE 5 MG TABS: 5 | 30 days supply | Qty: 120 | Fill #0

## 2015-07-04 ENCOUNTER — Other Ambulatory Visit (HOSPITAL_COMMUNITY): Payer: Self-pay | Admitting: *Deleted

## 2015-07-05 ENCOUNTER — Ambulatory Visit (HOSPITAL_COMMUNITY)
Admission: RE | Admit: 2015-07-05 | Discharge: 2015-07-05 | Disposition: A | Discharge status: Home / Self Care | Payer: 59 | Source: Ambulatory Visit | Attending: Nephrology | Admitting: Nephrology

## 2015-07-05 DIAGNOSIS — I10 Essential (primary) hypertension: Secondary | ICD-10-CM | POA: Diagnosis not present

## 2015-07-05 DIAGNOSIS — D649 Anemia, unspecified: Secondary | ICD-10-CM | POA: Diagnosis not present

## 2015-07-05 DIAGNOSIS — E669 Obesity, unspecified: Secondary | ICD-10-CM | POA: Diagnosis not present

## 2015-07-05 DIAGNOSIS — N183 Chronic kidney disease, stage 3 (moderate): Secondary | ICD-10-CM | POA: Insufficient documentation

## 2015-07-05 DIAGNOSIS — D509 Iron deficiency anemia, unspecified: Secondary | ICD-10-CM | POA: Diagnosis not present

## 2015-07-05 DIAGNOSIS — D631 Anemia in chronic kidney disease: Secondary | ICD-10-CM | POA: Diagnosis not present

## 2015-07-05 DIAGNOSIS — Z79899 Other long term (current) drug therapy: Secondary | ICD-10-CM | POA: Diagnosis not present

## 2015-07-05 DIAGNOSIS — Z5181 Encounter for therapeutic drug level monitoring: Secondary | ICD-10-CM | POA: Diagnosis not present

## 2015-07-05 DIAGNOSIS — N185 Chronic kidney disease, stage 5: Secondary | ICD-10-CM | POA: Diagnosis not present

## 2015-07-05 DIAGNOSIS — E119 Type 2 diabetes mellitus without complications: Secondary | ICD-10-CM | POA: Diagnosis not present

## 2015-07-05 DIAGNOSIS — R600 Localized edema: Secondary | ICD-10-CM | POA: Diagnosis not present

## 2015-07-05 DIAGNOSIS — E1122 Type 2 diabetes mellitus with diabetic chronic kidney disease: Secondary | ICD-10-CM | POA: Diagnosis not present

## 2015-07-05 LAB — POCT HEMOGLOBIN-HEMACUE: Hemoglobin: 10.3 g/dL — ABNORMAL LOW (ref 12.0–15.0)

## 2015-07-05 LAB — IRON AND TIBC
Iron: 81 ug/dL (ref 28–170)
SATURATION RATIOS: 26 % (ref 10.4–31.8)
TIBC: 309 ug/dL (ref 250–450)
UIBC: 228 ug/dL

## 2015-07-05 LAB — RENAL FUNCTION PANEL
ALBUMIN: 3 g/dL — AB (ref 3.5–5.0)
Anion gap: 11 (ref 5–15)
BUN: 66 mg/dL — AB (ref 6–20)
CO2: 22 mmol/L (ref 22–32)
CREATININE: 5.21 mg/dL — AB (ref 0.44–1.00)
Calcium: 8.5 mg/dL — ABNORMAL LOW (ref 8.9–10.3)
Chloride: 107 mmol/L (ref 101–111)
GFR calc Af Amer: 9 mL/min — ABNORMAL LOW (ref >60)
GFR calc non Af Amer: 8 mL/min — ABNORMAL LOW (ref >60)
GLUCOSE: 311 mg/dL — AB (ref 65–99)
PHOSPHORUS: 4.7 mg/dL — AB (ref 2.5–4.6)
POTASSIUM: 5 mmol/L (ref 3.5–5.1)
SODIUM: 140 mmol/L (ref 135–145)

## 2015-07-05 LAB — FERRITIN: FERRITIN: 150 ng/mL (ref 11–307)

## 2015-07-05 MED ORDER — EPOETIN ALFA 10000 UNIT/ML IJ SOLN
INTRAMUSCULAR | Status: AC
  Administered 2015-07-05: 10000 [IU] via SUBCUTANEOUS
  Filled 2015-07-05: qty 1

## 2015-07-05 MED ORDER — EPOETIN ALFA 20000 UNIT/ML IJ SOLN
INTRAMUSCULAR | Status: AC
  Administered 2015-07-05: 20000 [IU] via SUBCUTANEOUS
  Filled 2015-07-05: qty 1

## 2015-07-05 MED ORDER — SODIUM CHLORIDE 0.9 % IV SOLN
510.0000 mg | Freq: Once | INTRAVENOUS | Status: AC
  Administered 2015-07-05: 510 mg via INTRAVENOUS
  Filled 2015-07-05: qty 17

## 2015-07-05 MED ORDER — EPOETIN ALFA 40000 UNIT/ML IJ SOLN: 30000.0000 [IU] | INTRAMUSCULAR | Status: DC

## 2015-07-07 ENCOUNTER — Encounter (HOSPITAL_COMMUNITY): Payer: 59

## 2015-07-10 DIAGNOSIS — I129 Hypertensive chronic kidney disease with stage 1 through stage 4 chronic kidney disease, or unspecified chronic kidney disease: Secondary | ICD-10-CM | POA: Diagnosis not present

## 2015-07-10 DIAGNOSIS — N184 Chronic kidney disease, stage 4 (severe): Secondary | ICD-10-CM | POA: Diagnosis not present

## 2015-07-10 DIAGNOSIS — E785 Hyperlipidemia, unspecified: Secondary | ICD-10-CM | POA: Diagnosis not present

## 2015-07-10 DIAGNOSIS — D649 Anemia, unspecified: Secondary | ICD-10-CM | POA: Diagnosis not present

## 2015-07-17 ENCOUNTER — Other Ambulatory Visit: Payer: Self-pay | Admitting: *Deleted

## 2015-07-17 DIAGNOSIS — Z01810 Encounter for preprocedural cardiovascular examination: Secondary | ICD-10-CM

## 2015-07-17 DIAGNOSIS — N185 Chronic kidney disease, stage 5: Secondary | ICD-10-CM

## 2015-07-18 ENCOUNTER — Other Ambulatory Visit: Payer: Self-pay

## 2015-07-18 ENCOUNTER — Ambulatory Visit
Admission: RE | Admit: 2015-07-18 | Discharge: 2015-07-18 | Disposition: A | Discharge status: Home / Self Care | Payer: 59 | Source: Ambulatory Visit

## 2015-07-18 DIAGNOSIS — Z1231 Encounter for screening mammogram for malignant neoplasm of breast: Secondary | ICD-10-CM | POA: Diagnosis not present

## 2015-08-02 MED FILL — glyBURIDE 5 MG TABS: 5 | 30 days supply | Qty: 120 | Fill #1

## 2015-08-04 ENCOUNTER — Ambulatory Visit: Payer: 59 | Admitting: Vascular Surgery

## 2015-08-04 ENCOUNTER — Encounter (HOSPITAL_COMMUNITY): Payer: 59

## 2015-08-04 ENCOUNTER — Other Ambulatory Visit (HOSPITAL_COMMUNITY): Payer: 59

## 2015-08-07 ENCOUNTER — Ambulatory Visit: Payer: 59 | Admitting: Surgery

## 2015-08-07 ENCOUNTER — Encounter (HOSPITAL_COMMUNITY): Payer: 59

## 2015-08-07 ENCOUNTER — Other Ambulatory Visit (HOSPITAL_COMMUNITY): Payer: 59

## 2015-08-21 ENCOUNTER — Encounter (HOSPITAL_COMMUNITY): Payer: 59

## 2015-08-21 ENCOUNTER — Other Ambulatory Visit (HOSPITAL_COMMUNITY): Payer: 59

## 2015-08-23 ENCOUNTER — Ambulatory Visit (INDEPENDENT_AMBULATORY_CARE_PROVIDER_SITE_OTHER)
Admission: RE | Admit: 2015-08-23 | Discharge: 2015-08-23 | Disposition: A | Discharge status: Home / Self Care | Payer: 59 | Source: Ambulatory Visit | Attending: Vascular Surgery | Admitting: Vascular Surgery

## 2015-08-23 ENCOUNTER — Ambulatory Visit (HOSPITAL_COMMUNITY)
Admission: RE | Admit: 2015-08-23 | Discharge: 2015-08-23 | Disposition: A | Discharge status: Home / Self Care | Payer: 59 | Source: Ambulatory Visit | Attending: Vascular Surgery | Admitting: Vascular Surgery

## 2015-08-23 DIAGNOSIS — N185 Chronic kidney disease, stage 5: Secondary | ICD-10-CM

## 2015-08-23 DIAGNOSIS — E1122 Type 2 diabetes mellitus with diabetic chronic kidney disease: Secondary | ICD-10-CM | POA: Diagnosis not present

## 2015-08-23 DIAGNOSIS — I120 Hypertensive chronic kidney disease with stage 5 chronic kidney disease or end stage renal disease: Secondary | ICD-10-CM | POA: Insufficient documentation

## 2015-08-23 DIAGNOSIS — Z01810 Encounter for preprocedural cardiovascular examination: Secondary | ICD-10-CM

## 2015-08-23 MED FILL — AMLODIPINE BESYLATE 10 MG T: 10 | 90 days supply | Qty: 90 | Fill #0

## 2015-08-29 ENCOUNTER — Encounter: Payer: Self-pay | Admitting: Vascular Surgery

## 2015-09-06 ENCOUNTER — Encounter (HOSPITAL_COMMUNITY)
Admission: RE | Admit: 2015-09-06 | Discharge: 2015-09-06 | Disposition: A | Discharge status: Home / Self Care | Payer: 59 | Source: Ambulatory Visit | Attending: Nephrology | Admitting: Nephrology

## 2015-09-06 ENCOUNTER — Other Ambulatory Visit: Payer: Self-pay

## 2015-09-06 ENCOUNTER — Ambulatory Visit (INDEPENDENT_AMBULATORY_CARE_PROVIDER_SITE_OTHER): Payer: 59 | Admitting: Vascular Surgery

## 2015-09-06 ENCOUNTER — Encounter: Payer: Self-pay | Admitting: Vascular Surgery

## 2015-09-06 VITALS — BP 177/74 | HR 62 | Temp 98.3°F | Resp 18 | Ht 64.0 in | Wt 186.0 lb

## 2015-09-06 DIAGNOSIS — Z5181 Encounter for therapeutic drug level monitoring: Secondary | ICD-10-CM | POA: Diagnosis not present

## 2015-09-06 DIAGNOSIS — N183 Chronic kidney disease, stage 3 (moderate): Secondary | ICD-10-CM | POA: Diagnosis not present

## 2015-09-06 DIAGNOSIS — D631 Anemia in chronic kidney disease: Secondary | ICD-10-CM | POA: Diagnosis not present

## 2015-09-06 DIAGNOSIS — Z79899 Other long term (current) drug therapy: Secondary | ICD-10-CM | POA: Diagnosis not present

## 2015-09-06 DIAGNOSIS — N185 Chronic kidney disease, stage 5: Secondary | ICD-10-CM

## 2015-09-06 LAB — IRON AND TIBC
Iron: 95 ug/dL (ref 28–170)
Saturation Ratios: 33 % — ABNORMAL HIGH (ref 10.4–31.8)
TIBC: 286 ug/dL (ref 250–450)
UIBC: 191 ug/dL

## 2015-09-06 LAB — RENAL FUNCTION PANEL
ALBUMIN: 3 g/dL — AB (ref 3.5–5.0)
ANION GAP: 11 (ref 5–15)
BUN: 75 mg/dL — ABNORMAL HIGH (ref 6–20)
CALCIUM: 8.3 mg/dL — AB (ref 8.9–10.3)
CO2: 19 mmol/L — ABNORMAL LOW (ref 22–32)
Chloride: 109 mmol/L (ref 101–111)
Creatinine, Ser: 6.22 mg/dL — ABNORMAL HIGH (ref 0.44–1.00)
GFR calc non Af Amer: 6 mL/min — ABNORMAL LOW (ref >60)
GFR, EST AFRICAN AMERICAN: 7 mL/min — AB (ref >60)
Glucose, Bld: 246 mg/dL — ABNORMAL HIGH (ref 65–99)
PHOSPHORUS: 4.6 mg/dL (ref 2.5–4.6)
Potassium: 5.4 mmol/L — ABNORMAL HIGH (ref 3.5–5.1)
SODIUM: 139 mmol/L (ref 135–145)

## 2015-09-06 LAB — POCT HEMOGLOBIN-HEMACUE: Hemoglobin: 9 g/dL — ABNORMAL LOW (ref 12.0–15.0)

## 2015-09-06 LAB — FERRITIN: Ferritin: 433 ng/mL — ABNORMAL HIGH (ref 11–307)

## 2015-09-06 MED ORDER — EPOETIN ALFA 40000 UNIT/ML IJ SOLN: 30000.0000 [IU] | INTRAMUSCULAR | Status: DC

## 2015-09-06 MED ORDER — EPOETIN ALFA 20000 UNIT/ML IJ SOLN
INTRAMUSCULAR | Status: AC
  Administered 2015-09-06: 20000 [IU]
  Filled 2015-09-06: qty 1

## 2015-09-06 MED ORDER — EPOETIN ALFA 10000 UNIT/ML IJ SOLN
INTRAMUSCULAR | Status: AC
Start: 2015-09-06 — End: 2015-09-06
  Administered 2015-09-06: 10000 [IU]
  Filled 2015-09-06: qty 1

## 2015-09-06 NOTE — Progress Notes (Signed)
[facility]ist of [facility]  Patient name: Kristy Santos MRN: [place]:[id number] DOB: October 28, 1950 Sex: female  REASON FOR CONSULT: Evaluate for hemodialysis access. Referred by Dr. Erling Cruz.  HPI: Kristy Santos is a 65 y.o. female, who is right-handed. He is not yet on dialysis. She denies any recent uremic symptoms. Specifically she denies nausea, vomiting, fatigue, anorexia, or palpitations.  I have reviewed the records from [facility]. She has stage V chronic kidney disease. This is felt to be secondary to diabetic nephropathy and hypertensive nephropathy. On 07/05/2015 creatinine was 5.21. Creatinine clearance was 9.  Past Medical History  Diagnosis Date  . Hypertension   . Diabetes mellitus     Family History  Problem Relation Age of Onset  . Cancer Mother   . Diabetes Father     SOCIAL HISTORY: Social History   Social History  . Marital Status: Married    Spouse Name: N/A  . Number of Children: N/A  . Years of Education: N/A   Occupational History  . Not on file.   Social History Main Topics  . Smoking status: Never Smoker   . Smokeless tobacco: Never Used     Comment: never used  . Alcohol Use: No  . Drug Use: No  . Sexual Activity: Not on file   Other Topics Concern  . Not on file   Social History Narrative    No Known Allergies  Current Outpatient Prescriptions  Medication Sig Dispense Refill  . amLODipine (NORVASC) 5 MG tablet Take 5 mg by mouth 2 (two) times daily.    . fish oil-omega-3 fatty acids 1000 MG capsule Take 1 g by mouth daily.    . furosemide (LASIX) 20 MG tablet Take 20 mg by mouth daily.    Marland Kitchen glimepiride (AMARYL) 4 MG tablet Take 2 mg by mouth 2 (two) times daily.    . metoprolol succinate (TOPROL-XL) 50 MG 24 hr tablet Take 50 mg by mouth every evening.    . Multiple Vitamin (MULTIVITAMIN WITH MINERALS) TABS Take 1 tablet by mouth daily.     No current facility-administered medications for this visit.     REVIEW OF SYSTEMS:  [X]  denotes positive finding, [ ]  denotes negative finding Cardiac  Comments:  Chest pain or chest pressure:    Shortness of breath upon exertion:    Short of breath when lying flat:    Irregular heart rhythm:        Vascular    Pain in calf, thigh, or hip brought on by ambulation:    Pain in feet at night that wakes you up from your sleep:     Blood clot in your veins:    Leg swelling:         Pulmonary    Oxygen at home:    Productive cough:     Wheezing:         Neurologic    Sudden weakness in arms or legs:     Sudden numbness in arms or legs:     Sudden onset of difficulty speaking or slurred speech:    Temporary loss of vision in one eye:     Problems with dizziness:         Gastrointestinal    Blood in stool:     Vomited blood:         Genitourinary    Burning when urinating:     Blood in urine:  Psychiatric    Major depression:         Hematologic    Bleeding problems:    Problems with blood clotting too easily:        Skin    Rashes or ulcers:        Constitutional    Fever or chills:      PHYSICAL EXAM: Filed Vitals:   09/06/15 0908 09/06/15 0913  BP: 179/77 177/74  Pulse: 62 62  Temp: 98.3 F (36.8 C)   TempSrc: Oral   Resp: 18   Height: 5\' 4"  (1.626 m)   Weight: 186 lb (84.369 kg)   SpO2: 100%     GENERAL: The patient is a well-nourished female, in no acute distress. The vital signs are documented above. CARDIAC: There is a regular rate and rhythm.  VASCULAR: I do not detect carotid bruits. She has palpable radial pulses bilaterally. PULMONARY: There is good air exchange bilaterally without wheezing or rales. ABDOMEN: Soft and non-tender with normal pitched bowel sounds.  MUSCULOSKELETAL: There are no major deformities or cyanosis. NEUROLOGIC: No focal weakness or paresthesias are detected. SKIN: There are no ulcers or rashes noted. PSYCHIATRIC: The patient has a normal affect.  DATA:   UPPER  EXTREMITY VEIN MAP: I have independently interpreted her upper extremity vein map.  On the right side, the forearm cephalic vein is very small. The upper arm cephalic Likewise does not appear usable. The basilic vein on the right does not appear to be usable.  On the left side, the forearm cephalic vein does not appear adequate. The upper arm cephalic vein on the left looks marginal in size. The basilic vein on the left looks marginal in size.  UPPER EXTREMITY ARTERIAL DUPLEX: I have independently interpreted her upper extremity arterial duplex scan. She has triphasic Doppler signals in the radial and ulnar positions bilaterally.  MEDICAL ISSUES:  STAGE V CHRONIC KIDNEY DISEASE: her vein in the right arm does not appear to be adequate for a fistula. The upper arm cephalic vein on the left is marginal in size and the basilic vein is marginal. If neither are adequate she could have an AV graft. I have explained the indications for placement of an AV fistula or AV graft. I've explained that if at all possible we will place an AV fistula.  I have reviewed the risks of placement of an AV fistula including but not limited to: failure of the fistula to mature, need for subsequent interventions, and thrombosis. In addition I have reviewed the potential complications of placement of an AV graft. These risks include, but are not limited to, graft thrombosis, graft infection, wound healing problems, bleeding, arm swelling, and steal syndrome. All the patient's questions were answered and they are agreeable to proceed with surgery. Her surgery is scheduled for 09/19/2015.    HYPERTENSION: The patient's initial blood pressure today was elevated. We repeated this and this was still elevated. We have encouraged the patient to follow up with their primary care physician for management of their blood pressure.   Deitra Mayo [facility] [place]: [phone]

## 2015-09-18 ENCOUNTER — Encounter (HOSPITAL_COMMUNITY): Payer: Self-pay | Admitting: *Deleted

## 2015-09-18 MED ORDER — SODIUM CHLORIDE 0.9 % IV SOLN
INTRAVENOUS | Status: DC
  Administered 2015-09-19: 25 mL/h via INTRAVENOUS

## 2015-09-18 MED ORDER — DEXTROSE 5 % IV SOLN
1.5000 g | INTRAVENOUS | Status: AC
  Administered 2015-09-19: 1.5 g via INTRAVENOUS
  Filled 2015-09-18: qty 1.5

## 2015-09-18 MED FILL — METOPROLOL SUCC ER 100 MG T: 100 | 90 days supply | Qty: 90 | Fill #1

## 2015-09-18 NOTE — Progress Notes (Signed)
Pt denies SOB and chest pain but is under the care of Dr. Terrence Dupont, Cardiology. Pt denies having a stress test, echo and cardiac cath. Pt denies having an EKG and chest x ray within the last year. Pt made aware to stop vitamins, fish oil, herbal medications and NSAID's. Pt stated that her fasting blood sugar ranges from 50-100. Pt made aware to not take Glimepiride tonight or morning of surgery. Pt provided with diabetes protocol instructions to check BS every 2 hours prior to arrival for surgery, interventions for BS<70 and >220 and phone # to SS. Pt verbalized understanding of all pre-op instructions.

## 2015-09-19 ENCOUNTER — Ambulatory Visit (HOSPITAL_COMMUNITY): Payer: 59 | Admitting: Certified Registered"

## 2015-09-19 ENCOUNTER — Other Ambulatory Visit: Payer: Self-pay | Admitting: *Deleted

## 2015-09-19 ENCOUNTER — Encounter (HOSPITAL_COMMUNITY)
Admission: RE | Disposition: A | Discharge status: Home / Self Care | Payer: Self-pay | Source: Ambulatory Visit | Attending: Vascular Surgery

## 2015-09-19 ENCOUNTER — Ambulatory Visit (HOSPITAL_COMMUNITY)
Admission: RE | Admit: 2015-09-19 | Discharge: 2015-09-19 | Disposition: A | Discharge status: Home / Self Care | Payer: 59 | Source: Ambulatory Visit | Attending: Vascular Surgery | Admitting: Vascular Surgery

## 2015-09-19 ENCOUNTER — Encounter (HOSPITAL_COMMUNITY): Payer: Self-pay | Admitting: *Deleted

## 2015-09-19 DIAGNOSIS — I120 Hypertensive chronic kidney disease with stage 5 chronic kidney disease or end stage renal disease: Secondary | ICD-10-CM | POA: Diagnosis not present

## 2015-09-19 DIAGNOSIS — Z7984 Long term (current) use of oral hypoglycemic drugs: Secondary | ICD-10-CM | POA: Diagnosis not present

## 2015-09-19 DIAGNOSIS — N185 Chronic kidney disease, stage 5: Secondary | ICD-10-CM | POA: Diagnosis not present

## 2015-09-19 DIAGNOSIS — D649 Anemia, unspecified: Secondary | ICD-10-CM | POA: Diagnosis not present

## 2015-09-19 DIAGNOSIS — Z6831 Body mass index (BMI) 31.0-31.9, adult: Secondary | ICD-10-CM | POA: Diagnosis not present

## 2015-09-19 DIAGNOSIS — N186 End stage renal disease: Secondary | ICD-10-CM | POA: Diagnosis not present

## 2015-09-19 DIAGNOSIS — E669 Obesity, unspecified: Secondary | ICD-10-CM | POA: Insufficient documentation

## 2015-09-19 DIAGNOSIS — Z4931 Encounter for adequacy testing for hemodialysis: Secondary | ICD-10-CM

## 2015-09-19 DIAGNOSIS — E1122 Type 2 diabetes mellitus with diabetic chronic kidney disease: Secondary | ICD-10-CM | POA: Insufficient documentation

## 2015-09-19 DIAGNOSIS — Z79899 Other long term (current) drug therapy: Secondary | ICD-10-CM | POA: Diagnosis not present

## 2015-09-19 HISTORY — PX: AV FISTULA PLACEMENT: SHX1204

## 2015-09-19 HISTORY — DX: Chronic kidney disease, unspecified: N18.9

## 2015-09-19 LAB — GLUCOSE, CAPILLARY
GLUCOSE-CAPILLARY: 130 mg/dL — AB (ref 65–99)
Glucose-Capillary: 146 mg/dL — ABNORMAL HIGH (ref 65–99)

## 2015-09-19 LAB — POCT I-STAT 4, (NA,K, GLUC, HGB,HCT)
Glucose, Bld: 147 mg/dL — ABNORMAL HIGH (ref 65–99)
HCT: 31 % — ABNORMAL LOW (ref 36.0–46.0)
HEMOGLOBIN: 10.5 g/dL — AB (ref 12.0–15.0)
Potassium: 4.8 mmol/L (ref 3.5–5.1)
SODIUM: 143 mmol/L (ref 135–145)

## 2015-09-19 SURGERY — ARTERIOVENOUS (AV) FISTULA CREATION
Anesthesia: General | Site: Arm Lower | Laterality: Left

## 2015-09-19 MED ORDER — LIDOCAINE HCL (CARDIAC) 20 MG/ML IV SOLN
INTRAVENOUS | Status: AC
  Filled 2015-09-19: qty 5

## 2015-09-19 MED ORDER — FENTANYL CITRATE (PF) 250 MCG/5ML IJ SOLN
INTRAMUSCULAR | Status: DC | PRN
  Administered 2015-09-19 (×4): 25 ug via INTRAVENOUS

## 2015-09-19 MED ORDER — LIDOCAINE HCL (CARDIAC) 20 MG/ML IV SOLN
INTRAVENOUS | Status: DC | PRN
  Administered 2015-09-19: 60 mg via INTRAVENOUS

## 2015-09-19 MED ORDER — EPHEDRINE SULFATE 50 MG/ML IJ SOLN
INTRAMUSCULAR | Status: AC
  Filled 2015-09-19: qty 1

## 2015-09-19 MED ORDER — HEPARIN SODIUM (PORCINE) 1000 UNIT/ML IJ SOLN
INTRAMUSCULAR | Status: DC | PRN
  Administered 2015-09-19: 5000 [IU] via INTRAVENOUS

## 2015-09-19 MED ORDER — FENTANYL CITRATE (PF) 100 MCG/2ML IJ SOLN
25.0000 ug | INTRAMUSCULAR | Status: DC | PRN
  Administered 2015-09-19: 50 ug via INTRAVENOUS

## 2015-09-19 MED ORDER — MIDAZOLAM HCL 5 MG/5ML IJ SOLN
INTRAMUSCULAR | Status: DC | PRN
  Administered 2015-09-19: 2 mg via INTRAVENOUS

## 2015-09-19 MED ORDER — 0.9 % SODIUM CHLORIDE (POUR BTL) OPTIME
TOPICAL | Status: DC | PRN
  Administered 2015-09-19: 1000 mL

## 2015-09-19 MED ORDER — FENTANYL CITRATE (PF) 250 MCG/5ML IJ SOLN
INTRAMUSCULAR | Status: AC
  Filled 2015-09-19: qty 5

## 2015-09-19 MED ORDER — EPHEDRINE SULFATE 50 MG/ML IJ SOLN
INTRAMUSCULAR | Status: DC | PRN
  Administered 2015-09-19: 10 mg via INTRAVENOUS

## 2015-09-19 MED ORDER — PROPOFOL 10 MG/ML IV BOLUS
INTRAVENOUS | Status: AC
  Filled 2015-09-19: qty 20

## 2015-09-19 MED ORDER — PROPOFOL 10 MG/ML IV BOLUS
INTRAVENOUS | Status: DC | PRN
  Administered 2015-09-19: 30 mg via INTRAVENOUS
  Administered 2015-09-19: 20 mg via INTRAVENOUS
  Administered 2015-09-19: 120 mg via INTRAVENOUS
  Administered 2015-09-19: 20 mg via INTRAVENOUS

## 2015-09-19 MED ORDER — HYDROCODONE-ACETAMINOPHEN 7.5-325 MG PO TABS: 1.0000 | ORAL_TABLET | Freq: Once | ORAL | Status: DC | PRN

## 2015-09-19 MED ORDER — METOCLOPRAMIDE HCL 5 MG/ML IJ SOLN
INTRAMUSCULAR | Status: AC
  Filled 2015-09-19: qty 2

## 2015-09-19 MED ORDER — METOCLOPRAMIDE HCL 5 MG/ML IJ SOLN
10.0000 mg | Freq: Once | INTRAMUSCULAR | Status: AC | PRN
  Administered 2015-09-19: 10 mg via INTRAVENOUS

## 2015-09-19 MED ORDER — SODIUM CHLORIDE 0.9 % IV SOLN
INTRAVENOUS | Status: DC | PRN
  Administered 2015-09-19: 500 mL

## 2015-09-19 MED ORDER — ONDANSETRON HCL 4 MG/2ML IJ SOLN
INTRAMUSCULAR | Status: DC | PRN
  Administered 2015-09-19: 4 mg via INTRAVENOUS

## 2015-09-19 MED ORDER — MIDAZOLAM HCL 2 MG/2ML IJ SOLN
INTRAMUSCULAR | Status: AC
  Filled 2015-09-19: qty 2

## 2015-09-19 MED ORDER — FENTANYL CITRATE (PF) 100 MCG/2ML IJ SOLN
INTRAMUSCULAR | Status: AC
  Filled 2015-09-19: qty 2

## 2015-09-19 MED ORDER — OXYCODONE-ACETAMINOPHEN 5-325 MG PO TABS: 1.0000 | ORAL_TABLET | Freq: Four times a day (QID) | ORAL | Status: DC | PRN

## 2015-09-19 MED ORDER — SODIUM CHLORIDE 0.9 % IJ SOLN
INTRAMUSCULAR | Status: AC
  Filled 2015-09-19: qty 10

## 2015-09-19 MED ORDER — CHLORHEXIDINE GLUCONATE CLOTH 2 % EX PADS: 6.0000 | MEDICATED_PAD | Freq: Once | CUTANEOUS | Status: DC

## 2015-09-19 MED ORDER — PROTAMINE SULFATE 10 MG/ML IV SOLN
INTRAVENOUS | Status: DC | PRN
  Administered 2015-09-19: 10 mg via INTRAVENOUS
  Administered 2015-09-19 (×2): 20 mg via INTRAVENOUS

## 2015-09-19 MED FILL — OXYCODONE/APAP 5-325: 5-325 | 2 days supply | Qty: 6 | Fill #0

## 2015-09-19 SURGICAL SUPPLY — 36 items
ARMBAND PINK RESTRICT EXTREMIT (MISCELLANEOUS) ×2 IMPLANT
CANISTER SUCTION 2500CC (MISCELLANEOUS) ×2 IMPLANT
CANNULA VESSEL 3MM 2 BLNT TIP (CANNULA) ×2 IMPLANT
CLIP TI MEDIUM 6 (CLIP) ×2 IMPLANT
CLIP TI WIDE RED SMALL 6 (CLIP) ×2 IMPLANT
COVER PROBE W GEL 5X96 (DRAPES) IMPLANT
DECANTER SPIKE VIAL GLASS SM (MISCELLANEOUS) ×1 IMPLANT
DRAIN PENROSE 1/4X12 LTX STRL (WOUND CARE) ×2 IMPLANT
ELECT REM PT RETURN 9FT ADLT (ELECTROSURGICAL) ×2
ELECTRODE REM PT RTRN 9FT ADLT (ELECTROSURGICAL) ×1 IMPLANT
GLOVE BIO SURGEON STRL SZ 6.5 (GLOVE) ×1 IMPLANT
GLOVE BIO SURGEON STRL SZ7.5 (GLOVE) ×2 IMPLANT
GLOVE BIO SURGEON STRL SZ8 (GLOVE) ×1 IMPLANT
GLOVE BIOGEL PI IND STRL 6.5 (GLOVE) IMPLANT
GLOVE BIOGEL PI IND STRL 7.0 (GLOVE) IMPLANT
GLOVE BIOGEL PI IND STRL 8 (GLOVE) IMPLANT
GLOVE BIOGEL PI INDICATOR 6.5 (GLOVE) ×1
GLOVE BIOGEL PI INDICATOR 7.0 (GLOVE) ×1
GLOVE BIOGEL PI INDICATOR 8 (GLOVE) ×1
GOWN STRL REUS W/ TWL LRG LVL3 (GOWN DISPOSABLE) ×3 IMPLANT
GOWN STRL REUS W/TWL LRG LVL3 (GOWN DISPOSABLE) ×10
KIT BASIN OR (CUSTOM PROCEDURE TRAY) ×2 IMPLANT
KIT ROOM TURNOVER OR (KITS) ×2 IMPLANT
LIQUID BAND (GAUZE/BANDAGES/DRESSINGS) ×3 IMPLANT
LOOP VESSEL MINI RED (MISCELLANEOUS) IMPLANT
NS IRRIG 1000ML POUR BTL (IV SOLUTION) ×2 IMPLANT
PACK CV ACCESS (CUSTOM PROCEDURE TRAY) ×2 IMPLANT
PAD ARMBOARD 7.5X6 YLW CONV (MISCELLANEOUS) ×4 IMPLANT
SPONGE SURGIFOAM ABS GEL 100 (HEMOSTASIS) IMPLANT
SUT PROLENE 7 0 BV 1 (SUTURE) ×3 IMPLANT
SUT SILK 2 0 SH (SUTURE) ×1 IMPLANT
SUT VIC AB 3-0 SH 27 (SUTURE) ×2
SUT VIC AB 3-0 SH 27X BRD (SUTURE) ×1 IMPLANT
SUT VICRYL 4-0 PS2 18IN ABS (SUTURE) ×2 IMPLANT
UNDERPAD 30X30 INCONTINENT (UNDERPADS AND DIAPERS) ×2 IMPLANT
WATER STERILE IRR 1000ML POUR (IV SOLUTION) ×2 IMPLANT

## 2015-09-19 NOTE — Transfer of Care (Signed)
Immediate Anesthesia Transfer of Care Note  Patient: Kristy Santos  Procedure(s) Performed: Procedure(s): Creation of Left  Arm Brachial Cephalic Arteriovenous Fistula (Left)  Patient Location: PACU  Anesthesia Type:General  Level of Consciousness: awake, alert , oriented and patient cooperative  Airway & Oxygen Therapy: Patient Spontanous Breathing and Patient connected to nasal cannula oxygen  Post-op Assessment: Report given to RN, Post -op Vital signs reviewed and stable and Patient moving all extremities  Post vital signs: Reviewed and stable  Last Vitals:  Filed Vitals:   09/19/15 0931  BP: 205/66  Pulse: 50  Temp: 36.7 C  Resp: 18    Complications: No apparent anesthesia complications

## 2015-09-19 NOTE — Anesthesia Postprocedure Evaluation (Signed)
Anesthesia Post Note  Patient: Kristy Santos  Procedure(s) Performed: Procedure(s) (LRB): Creation of Left  Arm Brachial Cephalic Arteriovenous Fistula (Left)  Patient location during evaluation: PACU Anesthesia Type: General Level of consciousness: awake and alert and oriented Pain management: pain level controlled Vital Signs Assessment: post-procedure vital signs reviewed and stable Respiratory status: spontaneous breathing, nonlabored ventilation and respiratory function stable Cardiovascular status: blood pressure returned to baseline and stable Postop Assessment: no signs of nausea or vomiting Anesthetic complications: no    Last Vitals:  Filed Vitals:   09/19/15 1451 09/19/15 1452  BP:  197/64  Pulse: 77 64  Temp: 36.7 C   Resp: 14     Last Pain:  Filed Vitals:   09/19/15 1503  PainSc: 0-No pain                 Jeraline Marcinek A.

## 2015-09-19 NOTE — Anesthesia Procedure Notes (Signed)
Procedure Name: LMA Insertion Date/Time: 09/19/2015 12:06 PM Performed by: Myna Bright Pre-anesthesia Checklist: Patient identified, Emergency Drugs available, Suction available and Patient being monitored Patient Re-evaluated:Patient Re-evaluated prior to inductionOxygen Delivery Method: Circle system utilized Preoxygenation: Pre-oxygenation with 100% oxygen Intubation Type: IV induction Ventilation: Mask ventilation without difficulty LMA: LMA inserted LMA Size: 4.0 Tube type: Oral Number of attempts: 1 Placement Confirmation: positive ETCO2 and breath sounds checked- equal and bilateral Tube secured with: Tape Dental Injury: Teeth and Oropharynx as per pre-operative assessment

## 2015-09-19 NOTE — H&P (View-Only) (Signed)
Vascular and Vein Specialist of [facility]  Patient name: Kristy Santos MRN: [place]:[id number] DOB: [birth year] Sex: female  REASON FOR CONSULT: Evaluate for hemodialysis access. Referred by Dr. Erling Cruz.  HPI: Kristy Santos is a 65 y.o. female, who is right-handed. He is not yet on dialysis. She denies any recent uremic symptoms. Specifically she denies nausea, vomiting, fatigue, anorexia, or palpitations.  I have reviewed the records from [facility]. She has stage V chronic kidney disease. This is felt to be secondary to diabetic nephropathy and hypertensive nephropathy. On 07/05/2015 creatinine was 5.21. Creatinine clearance was 9.  Past Medical History  Diagnosis Date  . Hypertension   . Diabetes mellitus     Family History  Problem Relation Age of Onset  . Cancer Mother   . Diabetes Father     SOCIAL HISTORY: Social History   Social History  . Marital Status: Married    Spouse Name: N/A  . Number of Children: N/A  . Years of Education: N/A   Occupational History  . Not on file.   Social History Main Topics  . Smoking status: Never Smoker   . Smokeless tobacco: Never Used     Comment: never used  . Alcohol Use: No  . Drug Use: No  . Sexual Activity: Not on file   Other Topics Concern  . Not on file   Social History Narrative    No Known Allergies  Current Outpatient Prescriptions  Medication Sig Dispense Refill  . amLODipine (NORVASC) 5 MG tablet Take 5 mg by mouth 2 (two) times daily.    . fish oil-omega-3 fatty acids 1000 MG capsule Take 1 g by mouth daily.    . furosemide (LASIX) 20 MG tablet Take 20 mg by mouth daily.    Marland Kitchen glimepiride (AMARYL) 4 MG tablet Take 2 mg by mouth 2 (two) times daily.    . metoprolol succinate (TOPROL-XL) 50 MG 24 hr tablet Take 50 mg by mouth every evening.    . Multiple Vitamin (MULTIVITAMIN WITH MINERALS) TABS Take 1 tablet by mouth daily.     No current facility-administered medications for this visit.     REVIEW OF SYSTEMS:  [X]  denotes positive finding, [ ]  denotes negative finding Cardiac  Comments:  Chest pain or chest pressure:    Shortness of breath upon exertion:    Short of breath when lying flat:    Irregular heart rhythm:        Vascular    Pain in calf, thigh, or hip brought on by ambulation:    Pain in feet at night that wakes you up from your sleep:     Blood clot in your veins:    Leg swelling:         Pulmonary    Oxygen at home:    Productive cough:     Wheezing:         Neurologic    Sudden weakness in arms or legs:     Sudden numbness in arms or legs:     Sudden onset of difficulty speaking or slurred speech:    Temporary loss of vision in one eye:     Problems with dizziness:         Gastrointestinal    Blood in stool:     Vomited blood:         Genitourinary    Burning when urinating:     Blood in urine:  Psychiatric    Major depression:         Hematologic    Bleeding problems:    Problems with blood clotting too easily:        Skin    Rashes or ulcers:        Constitutional    Fever or chills:      PHYSICAL EXAM: Filed Vitals:   09/06/15 0908 09/06/15 0913  BP: 179/77 177/74  Pulse: 62 62  Temp: 98.3 F (36.8 C)   TempSrc: Oral   Resp: 18   Height: 5\' 4"  (1.626 m)   Weight: 186 lb (84.369 kg)   SpO2: 100%     GENERAL: The patient is a well-nourished female, in no acute distress. The vital signs are documented above. CARDIAC: There is a regular rate and rhythm.  VASCULAR: I do not detect carotid bruits. She has palpable radial pulses bilaterally. PULMONARY: There is good air exchange bilaterally without wheezing or rales. ABDOMEN: Soft and non-tender with normal pitched bowel sounds.  MUSCULOSKELETAL: There are no major deformities or cyanosis. NEUROLOGIC: No focal weakness or paresthesias are detected. SKIN: There are no ulcers or rashes noted. PSYCHIATRIC: The patient has a normal affect.  DATA:   UPPER  EXTREMITY VEIN MAP: I have independently interpreted her upper extremity vein map.  On the right side, the forearm cephalic vein is very small. The upper arm cephalic Likewise does not appear usable. The basilic vein on the right does not appear to be usable.  On the left side, the forearm cephalic vein does not appear adequate. The upper arm cephalic vein on the left looks marginal in size. The basilic vein on the left looks marginal in size.  UPPER EXTREMITY ARTERIAL DUPLEX: I have independently interpreted her upper extremity arterial duplex scan. She has triphasic Doppler signals in the radial and ulnar positions bilaterally.  MEDICAL ISSUES:  STAGE V CHRONIC KIDNEY DISEASE: her vein in the right arm does not appear to be adequate for a fistula. The upper arm cephalic vein on the left is marginal in size and the basilic vein is marginal. If neither are adequate she could have an AV graft. I have explained the indications for placement of an AV fistula or AV graft. I've explained that if at all possible we will place an AV fistula.  I have reviewed the risks of placement of an AV fistula including but not limited to: failure of the fistula to mature, need for subsequent interventions, and thrombosis. In addition I have reviewed the potential complications of placement of an AV graft. These risks include, but are not limited to, graft thrombosis, graft infection, wound healing problems, bleeding, arm swelling, and steal syndrome. All the patient's questions were answered and they are agreeable to proceed with surgery. Her surgery is scheduled for 09/19/2015.    HYPERTENSION: The patient's initial blood pressure today was elevated. We repeated this and this was still elevated. We have encouraged the patient to follow up with their primary care physician for management of their blood pressure.   Deitra Mayo [facility] [place]: [phone]

## 2015-09-19 NOTE — Op Note (Signed)
Procedure: Left Brachial Cephalic AV fistula  Preop: ESRD  Postop: ESRD  Anesthesia: General  Assistant: Silva Bandy PA-C  Findings: 3.5 mm cephalic vein  Procedure: After obtaining informed consent, the patient was taken to the operating room.  After induction of general anesthesia, the left upper extremity was prepped and draped in usual sterile fashion.  A transverse incision was then made near the antecubital crease the left arm. The incision was carried into the subcutaneous tissues down to level of the cephalic vein. The cephalic vein was approximately 3.5 mm in diameter. It was of good quality. This was dissected free circumferentially and small side branches ligated and divided between silk ties or clips. Next the brachial artery was dissected free in the medial portion of the incision. The artery was  3-4 mm in diameter. The vessel loops were placed proximal and distal to the planned site of arteriotomy. The patient was given 5000 units of intravenous heparin. After appropriate circulation time, the vessel loops were used to control the artery. A longitudinal opening was made in the brachial artery.  The vein was ligated distally with a 2-0 silk tie. The vein was controlled proximally with a fine bulldog clamp. The vein was then swung over to the artery and sewn end of vein to side of artery using a running 7-0 Prolene suture. Just prior to completion of the anastomosis, everything was fore bled back bled and thoroughly flushed. The anastomosis was secured, vessel loops released, and there was a palpable thrill in the fistula immediately. After hemostasis was obtained, the subcutaneous tissues were reapproximated using a running 3-0 Vicryl suture. The skin was then closed with a 4 Vicryl subcuticular stitch. Dermabond was applied to the skin incision.  The patient had a palpable radial pulse at the end of the case.  Ruta Hinds, MD [facility] [place] Office:  [phone] Pager: [phone]

## 2015-09-19 NOTE — Interval H&P Note (Signed)
History and Physical Interval Note:  09/19/2015 9:10 AM  Kristy Santos  has presented today for surgery, with the diagnosis of Stage V Chronic Kidney Disease N18.5  The various methods of treatment have been discussed with the patient and family. After consideration of risks, benefits and other options for treatment, the patient has consented to  Procedure(s): ARTERIOVENOUS (AV) FISTULA CREATION VERSUS GRAFT (Left) as a surgical intervention .  The patient's history has been reviewed, patient examined, no change in status, stable for surgery.  I have reviewed the patient's chart and labs.  Questions were answered to the patient's satisfaction.     Ruta Hinds

## 2015-09-19 NOTE — Anesthesia Preprocedure Evaluation (Addendum)
Anesthesia Evaluation  Patient identified by MRN, date of birth, ID band Patient awake    Reviewed: Allergy & Precautions, NPO status , Patient's Chart, lab work & pertinent test results, reviewed documented beta blocker date and time , Unable to perform ROS - Chart review only  Airway Mallampati: II  TM Distance: >3 FB Neck ROM: Full    Dental  (+) Dental Advisory Given, Poor Dentition   Pulmonary neg pulmonary ROS,    breath sounds clear to auscultation       Cardiovascular hypertension, Pt. on medications and Pt. on home beta blockers  Rhythm:Regular Rate:Normal     Neuro/Psych negative neurological ROS  negative psych ROS   GI/Hepatic negative GI ROS, Neg liver ROS,   Endo/Other  diabetes, Well Controlled, Type 2, Oral Hypoglycemic Agents  Renal/GU ESRFRenal disease  negative genitourinary   Musculoskeletal negative musculoskeletal ROS (+)   Abdominal (+) + obese,   Peds  Hematology  (+) anemia ,   Anesthesia Other Findings   Reproductive/Obstetrics negative OB ROS                           Anesthesia Physical Anesthesia Plan  ASA: III  Anesthesia Plan: General   Post-op Pain Management:    Induction: Intravenous  Airway Management Planned: LMA  Additional Equipment:   Intra-op Plan:   Post-operative Plan: Extubation in OR  Informed Consent: I have reviewed the patients History and Physical, chart, labs and discussed the procedure including the risks, benefits and alternatives for the proposed anesthesia with the patient or authorized representative who has indicated his/her understanding and acceptance.   Dental advisory given  Plan Discussed with: Anesthesiologist and Surgeon  Anesthesia Plan Comments:         Anesthesia Quick Evaluation

## 2015-09-19 NOTE — Progress Notes (Signed)
Notified Dr.Turk of BP. Pt did not take her BP med this morning but states she will as soon as she gets home. Okay to D/C pt home at this time based on that agreement per MDA

## 2015-09-20 ENCOUNTER — Encounter (HOSPITAL_COMMUNITY): Payer: Self-pay | Admitting: Vascular Surgery

## 2015-09-25 ENCOUNTER — Telehealth: Payer: Self-pay | Admitting: Vascular Surgery

## 2015-09-25 NOTE — Telephone Encounter (Signed)
-----   Message from Mena Goes, RN sent at 09/19/2015  1:52 PM EDT ----- Regarding: schedule   ----- Message -----    From: Alvia Grove, PA-C    Sent: 09/19/2015   1:13 PM      To: [facility] Charge Pool  S/p left brachial cephalic AVF [id number]  F/u with CEF in 6 weeks with duplex  Thanks Maudie Mercury

## 2015-09-25 NOTE — Telephone Encounter (Signed)
sched lab 11/07/15 at 4 and md 11/16/15 at 10:30. Lm on hm# to inform pt of appt.

## 2015-10-06 MED FILL — glyBURIDE 5 MG TABS: 5 | 30 days supply | Qty: 120 | Fill #2

## 2015-10-09 ENCOUNTER — Encounter (HOSPITAL_COMMUNITY)
Admission: RE | Admit: 2015-10-09 | Discharge: 2015-10-09 | Disposition: A | Discharge status: Home / Self Care | Payer: 59 | Source: Ambulatory Visit | Attending: Nephrology | Admitting: Nephrology

## 2015-10-09 DIAGNOSIS — Z5181 Encounter for therapeutic drug level monitoring: Secondary | ICD-10-CM | POA: Insufficient documentation

## 2015-10-09 DIAGNOSIS — D631 Anemia in chronic kidney disease: Secondary | ICD-10-CM | POA: Diagnosis not present

## 2015-10-09 DIAGNOSIS — N183 Chronic kidney disease, stage 3 (moderate): Secondary | ICD-10-CM | POA: Insufficient documentation

## 2015-10-09 DIAGNOSIS — Z79899 Other long term (current) drug therapy: Secondary | ICD-10-CM | POA: Insufficient documentation

## 2015-10-09 DIAGNOSIS — I129 Hypertensive chronic kidney disease with stage 1 through stage 4 chronic kidney disease, or unspecified chronic kidney disease: Secondary | ICD-10-CM | POA: Diagnosis not present

## 2015-10-09 DIAGNOSIS — D649 Anemia, unspecified: Secondary | ICD-10-CM | POA: Diagnosis not present

## 2015-10-09 DIAGNOSIS — N184 Chronic kidney disease, stage 4 (severe): Secondary | ICD-10-CM | POA: Diagnosis not present

## 2015-10-09 DIAGNOSIS — E119 Type 2 diabetes mellitus without complications: Secondary | ICD-10-CM | POA: Diagnosis not present

## 2015-10-09 LAB — RENAL FUNCTION PANEL
Albumin: 3 g/dL — ABNORMAL LOW (ref 3.5–5.0)
Anion gap: 10 (ref 5–15)
BUN: 78 mg/dL — AB (ref 6–20)
CHLORIDE: 113 mmol/L — AB (ref 101–111)
CO2: 17 mmol/L — AB (ref 22–32)
Calcium: 7.4 mg/dL — ABNORMAL LOW (ref 8.9–10.3)
Creatinine, Ser: 6.35 mg/dL — ABNORMAL HIGH (ref 0.44–1.00)
GFR calc Af Amer: 7 mL/min — ABNORMAL LOW (ref >60)
GFR calc non Af Amer: 6 mL/min — ABNORMAL LOW (ref >60)
GLUCOSE: 149 mg/dL — AB (ref 65–99)
POTASSIUM: 4.3 mmol/L (ref 3.5–5.1)
Phosphorus: 5.4 mg/dL — ABNORMAL HIGH (ref 2.5–4.6)
Sodium: 140 mmol/L (ref 135–145)

## 2015-10-09 LAB — IRON AND TIBC
IRON: 63 ug/dL (ref 28–170)
SATURATION RATIOS: 24 % (ref 10.4–31.8)
TIBC: 266 ug/dL (ref 250–450)
UIBC: 203 ug/dL

## 2015-10-09 LAB — POCT HEMOGLOBIN-HEMACUE: Hemoglobin: 8.7 g/dL — ABNORMAL LOW (ref 12.0–15.0)

## 2015-10-09 LAB — FERRITIN: Ferritin: 302 ng/mL (ref 11–307)

## 2015-10-09 MED ORDER — EPOETIN ALFA 40000 UNIT/ML IJ SOLN: 30000.0000 [IU] | INTRAMUSCULAR | Status: DC

## 2015-10-09 MED ORDER — EPOETIN ALFA 20000 UNIT/ML IJ SOLN
INTRAMUSCULAR | Status: AC
  Administered 2015-10-09: 20000 [IU] via SUBCUTANEOUS
  Filled 2015-10-09: qty 1

## 2015-10-09 MED ORDER — EPOETIN ALFA 10000 UNIT/ML IJ SOLN
INTRAMUSCULAR | Status: AC
  Administered 2015-10-09: 10000 [IU] via SUBCUTANEOUS
  Filled 2015-10-09: qty 1

## 2015-10-09 MED FILL — BIDIL TABLET: 20-37.5 | 30 days supply | Qty: 90 | Fill #0

## 2015-10-16 ENCOUNTER — Other Ambulatory Visit (HOSPITAL_COMMUNITY): Payer: Self-pay | Admitting: *Deleted

## 2015-10-17 ENCOUNTER — Ambulatory Visit (HOSPITAL_COMMUNITY)
Admission: RE | Admit: 2015-10-17 | Discharge: 2015-10-17 | Disposition: A | Discharge status: Home / Self Care | Payer: 59 | Source: Ambulatory Visit | Attending: Nephrology | Admitting: Nephrology

## 2015-10-17 DIAGNOSIS — D509 Iron deficiency anemia, unspecified: Secondary | ICD-10-CM | POA: Diagnosis not present

## 2015-10-17 MED ORDER — SODIUM CHLORIDE 0.9 % IV SOLN
510.0000 mg | Freq: Once | INTRAVENOUS | Status: AC
  Administered 2015-10-17: 510 mg via INTRAVENOUS
  Filled 2015-10-17: qty 17

## 2015-10-25 DIAGNOSIS — E1122 Type 2 diabetes mellitus with diabetic chronic kidney disease: Secondary | ICD-10-CM | POA: Diagnosis not present

## 2015-10-25 DIAGNOSIS — D649 Anemia, unspecified: Secondary | ICD-10-CM | POA: Diagnosis not present

## 2015-10-25 DIAGNOSIS — E119 Type 2 diabetes mellitus without complications: Secondary | ICD-10-CM | POA: Diagnosis not present

## 2015-10-25 DIAGNOSIS — N185 Chronic kidney disease, stage 5: Secondary | ICD-10-CM | POA: Diagnosis not present

## 2015-10-25 DIAGNOSIS — R600 Localized edema: Secondary | ICD-10-CM | POA: Diagnosis not present

## 2015-10-25 DIAGNOSIS — E669 Obesity, unspecified: Secondary | ICD-10-CM | POA: Diagnosis not present

## 2015-10-25 DIAGNOSIS — I10 Essential (primary) hypertension: Secondary | ICD-10-CM | POA: Diagnosis not present

## 2015-11-07 ENCOUNTER — Ambulatory Visit (HOSPITAL_COMMUNITY)
Admission: RE | Admit: 2015-11-07 | Discharge: 2015-11-07 | Disposition: A | Discharge status: Home / Self Care | Payer: 59 | Source: Ambulatory Visit | Attending: Vascular Surgery | Admitting: Vascular Surgery

## 2015-11-07 DIAGNOSIS — Z4931 Encounter for adequacy testing for hemodialysis: Secondary | ICD-10-CM | POA: Insufficient documentation

## 2015-11-07 DIAGNOSIS — I120 Hypertensive chronic kidney disease with stage 5 chronic kidney disease or end stage renal disease: Secondary | ICD-10-CM | POA: Diagnosis not present

## 2015-11-07 DIAGNOSIS — N186 End stage renal disease: Secondary | ICD-10-CM | POA: Insufficient documentation

## 2015-11-07 DIAGNOSIS — E1122 Type 2 diabetes mellitus with diabetic chronic kidney disease: Secondary | ICD-10-CM | POA: Insufficient documentation

## 2015-11-09 ENCOUNTER — Encounter: Payer: Self-pay | Admitting: Vascular Surgery

## 2015-11-13 ENCOUNTER — Encounter (HOSPITAL_COMMUNITY)
Admission: RE | Admit: 2015-11-13 | Discharge: 2015-11-13 | Disposition: A | Discharge status: Home / Self Care | Payer: 59 | Source: Ambulatory Visit | Attending: Nephrology | Admitting: Nephrology

## 2015-11-13 DIAGNOSIS — N183 Chronic kidney disease, stage 3 (moderate): Secondary | ICD-10-CM | POA: Insufficient documentation

## 2015-11-13 DIAGNOSIS — Z5181 Encounter for therapeutic drug level monitoring: Secondary | ICD-10-CM | POA: Insufficient documentation

## 2015-11-13 DIAGNOSIS — Z79899 Other long term (current) drug therapy: Secondary | ICD-10-CM | POA: Diagnosis not present

## 2015-11-13 DIAGNOSIS — D631 Anemia in chronic kidney disease: Secondary | ICD-10-CM | POA: Insufficient documentation

## 2015-11-13 LAB — IRON AND TIBC
IRON: 68 ug/dL (ref 28–170)
Saturation Ratios: 22 % (ref 10.4–31.8)
TIBC: 305 ug/dL (ref 250–450)
UIBC: 237 ug/dL

## 2015-11-13 LAB — RENAL FUNCTION PANEL
ANION GAP: 8 (ref 5–15)
Albumin: 3 g/dL — ABNORMAL LOW (ref 3.5–5.0)
BUN: 89 mg/dL — ABNORMAL HIGH (ref 6–20)
CHLORIDE: 113 mmol/L — AB (ref 101–111)
CO2: 16 mmol/L — ABNORMAL LOW (ref 22–32)
CREATININE: 6.74 mg/dL — AB (ref 0.44–1.00)
Calcium: 7.7 mg/dL — ABNORMAL LOW (ref 8.9–10.3)
GFR, EST AFRICAN AMERICAN: 7 mL/min — AB (ref >60)
GFR, EST NON AFRICAN AMERICAN: 6 mL/min — AB (ref >60)
Glucose, Bld: 137 mg/dL — ABNORMAL HIGH (ref 65–99)
POTASSIUM: 4.9 mmol/L (ref 3.5–5.1)
Phosphorus: 4.6 mg/dL (ref 2.5–4.6)
Sodium: 137 mmol/L (ref 135–145)

## 2015-11-13 LAB — FERRITIN: Ferritin: 532 ng/mL — ABNORMAL HIGH (ref 11–307)

## 2015-11-13 MED ORDER — EPOETIN ALFA 10000 UNIT/ML IJ SOLN
INTRAMUSCULAR | Status: AC
  Administered 2015-11-13: 10000 [IU] via SUBCUTANEOUS
  Filled 2015-11-13: qty 1

## 2015-11-13 MED ORDER — EPOETIN ALFA 20000 UNIT/ML IJ SOLN
INTRAMUSCULAR | Status: AC
  Administered 2015-11-13: 20000 [IU] via SUBCUTANEOUS
  Filled 2015-11-13: qty 1

## 2015-11-13 MED ORDER — EPOETIN ALFA 40000 UNIT/ML IJ SOLN: 30000.0000 [IU] | INTRAMUSCULAR | Status: DC

## 2015-11-14 LAB — POCT HEMOGLOBIN-HEMACUE: HEMOGLOBIN: 9.2 g/dL — AB (ref 12.0–15.0)

## 2015-11-16 ENCOUNTER — Encounter: Payer: Self-pay | Admitting: Vascular Surgery

## 2015-11-16 ENCOUNTER — Ambulatory Visit (INDEPENDENT_AMBULATORY_CARE_PROVIDER_SITE_OTHER): Payer: Self-pay | Admitting: Vascular Surgery

## 2015-11-16 VITALS — BP 185/76 | HR 60 | Temp 97.7°F | Resp 18 | Ht 63.0 in | Wt 188.0 lb

## 2015-11-16 DIAGNOSIS — N184 Chronic kidney disease, stage 4 (severe): Secondary | ICD-10-CM

## 2015-11-16 MED FILL — SODIUM BICARB 650 MG TABLET: 650 | 30 days supply | Qty: 90 | Fill #0

## 2015-11-16 NOTE — Progress Notes (Signed)
Patient is a 65 year old female who is status post left brachiocephalic AV fistula placement on 09/19/2015. She returns today for postoperative follow-up. She has some dysesthesias in the volar aspect of the forearm. She denies any numbness tingling or aching in her hand. She is not currently on dialysis.  Physical exam:  Filed Vitals:   11/16/15 1038  BP: 185/76  Pulse: 60  Temp: 97.7 F (36.5 C)  TempSrc: Oral  Resp: 18  Height: 5\' 3"  (1.6 m)  Weight: 188 lb (85.276 kg)  SpO2: 100%    Palpable thrill in fistula audible bruit incision well-healed fistula is palpable up into the middle arm  Data: Fistula 6-8 mm in diameter 3-10 mm in depth overall looks good   Assessment: Maturing left arm AV fistula  Plan: Patient was instructed in how to exercise the fistula today. She will follow-up with me in 6 weeks' time to see if fistula is ready for use.  Ruta Hinds, MD [facility] Office: [phone] Pager: [phone]

## 2015-11-22 DIAGNOSIS — I120 Hypertensive chronic kidney disease with stage 5 chronic kidney disease or end stage renal disease: Secondary | ICD-10-CM | POA: Diagnosis not present

## 2015-11-22 DIAGNOSIS — Z803 Family history of malignant neoplasm of breast: Secondary | ICD-10-CM | POA: Diagnosis not present

## 2015-11-22 DIAGNOSIS — Z6835 Body mass index (BMI) 35.0-35.9, adult: Secondary | ICD-10-CM | POA: Diagnosis not present

## 2015-11-22 DIAGNOSIS — E119 Type 2 diabetes mellitus without complications: Secondary | ICD-10-CM | POA: Diagnosis not present

## 2015-11-22 DIAGNOSIS — C541 Malignant neoplasm of endometrium: Secondary | ICD-10-CM | POA: Diagnosis not present

## 2015-11-22 DIAGNOSIS — I10 Essential (primary) hypertension: Secondary | ICD-10-CM | POA: Diagnosis not present

## 2015-11-22 DIAGNOSIS — N185 Chronic kidney disease, stage 5: Secondary | ICD-10-CM | POA: Diagnosis not present

## 2015-11-22 DIAGNOSIS — D631 Anemia in chronic kidney disease: Secondary | ICD-10-CM | POA: Diagnosis not present

## 2015-11-22 MED FILL — cloNIDine HCL 0.1 MG TABS: 0.1 | 30 days supply | Qty: 60 | Fill #0

## 2015-12-01 MED FILL — AMLODIPINE BESYLATE 10 MG T: 10 | 90 days supply | Qty: 90 | Fill #1

## 2015-12-12 ENCOUNTER — Other Ambulatory Visit (HOSPITAL_COMMUNITY): Payer: Self-pay | Admitting: *Deleted

## 2015-12-13 ENCOUNTER — Encounter (HOSPITAL_COMMUNITY)
Admission: RE | Admit: 2015-12-13 | Discharge: 2015-12-13 | Disposition: A | Discharge status: Home / Self Care | Payer: 59 | Source: Ambulatory Visit | Attending: Nephrology | Admitting: Nephrology

## 2015-12-13 DIAGNOSIS — D631 Anemia in chronic kidney disease: Secondary | ICD-10-CM | POA: Diagnosis not present

## 2015-12-13 DIAGNOSIS — Z5181 Encounter for therapeutic drug level monitoring: Secondary | ICD-10-CM | POA: Diagnosis not present

## 2015-12-13 DIAGNOSIS — Z79899 Other long term (current) drug therapy: Secondary | ICD-10-CM | POA: Insufficient documentation

## 2015-12-13 DIAGNOSIS — N183 Chronic kidney disease, stage 3 (moderate): Secondary | ICD-10-CM | POA: Insufficient documentation

## 2015-12-13 LAB — RENAL FUNCTION PANEL
Albumin: 2.9 g/dL — ABNORMAL LOW (ref 3.5–5.0)
Anion gap: 9 (ref 5–15)
BUN: 77 mg/dL — AB (ref 6–20)
CHLORIDE: 110 mmol/L (ref 101–111)
CO2: 20 mmol/L — AB (ref 22–32)
CREATININE: 6.51 mg/dL — AB (ref 0.44–1.00)
Calcium: 7.1 mg/dL — ABNORMAL LOW (ref 8.9–10.3)
GFR calc Af Amer: 7 mL/min — ABNORMAL LOW (ref >60)
GFR calc non Af Amer: 6 mL/min — ABNORMAL LOW (ref >60)
GLUCOSE: 176 mg/dL — AB (ref 65–99)
POTASSIUM: 4.4 mmol/L (ref 3.5–5.1)
Phosphorus: 4.9 mg/dL — ABNORMAL HIGH (ref 2.5–4.6)
Sodium: 139 mmol/L (ref 135–145)

## 2015-12-13 LAB — IRON AND TIBC
IRON: 61 ug/dL (ref 28–170)
SATURATION RATIOS: 24 % (ref 10.4–31.8)
TIBC: 258 ug/dL (ref 250–450)
UIBC: 197 ug/dL

## 2015-12-13 LAB — FERRITIN: FERRITIN: 415 ng/mL — AB (ref 11–307)

## 2015-12-13 LAB — POCT HEMOGLOBIN-HEMACUE: HEMOGLOBIN: 9.3 g/dL — AB (ref 12.0–15.0)

## 2015-12-13 MED ORDER — EPOETIN ALFA 20000 UNIT/ML IJ SOLN
INTRAMUSCULAR | Status: AC
  Administered 2015-12-13: 20000 [IU] via SUBCUTANEOUS
  Filled 2015-12-13: qty 1

## 2015-12-13 MED ORDER — EPOETIN ALFA 10000 UNIT/ML IJ SOLN
INTRAMUSCULAR | Status: AC
  Administered 2015-12-13: 10000 [IU] via SUBCUTANEOUS
  Filled 2015-12-13: qty 1

## 2015-12-13 MED ORDER — EPOETIN ALFA 40000 UNIT/ML IJ SOLN: 30000.0000 [IU] | INTRAMUSCULAR | Status: DC

## 2015-12-13 MED FILL — glyBURIDE 5 MG TABS: 5 | 30 days supply | Qty: 120 | Fill #3

## 2015-12-21 DIAGNOSIS — E1122 Type 2 diabetes mellitus with diabetic chronic kidney disease: Secondary | ICD-10-CM | POA: Diagnosis not present

## 2015-12-21 DIAGNOSIS — N185 Chronic kidney disease, stage 5: Secondary | ICD-10-CM | POA: Diagnosis not present

## 2015-12-21 DIAGNOSIS — E119 Type 2 diabetes mellitus without complications: Secondary | ICD-10-CM | POA: Diagnosis not present

## 2015-12-21 DIAGNOSIS — E669 Obesity, unspecified: Secondary | ICD-10-CM | POA: Diagnosis not present

## 2015-12-21 DIAGNOSIS — I10 Essential (primary) hypertension: Secondary | ICD-10-CM | POA: Diagnosis not present

## 2015-12-21 DIAGNOSIS — R600 Localized edema: Secondary | ICD-10-CM | POA: Diagnosis not present

## 2015-12-21 DIAGNOSIS — D649 Anemia, unspecified: Secondary | ICD-10-CM | POA: Diagnosis not present

## 2015-12-22 ENCOUNTER — Encounter: Payer: Self-pay | Admitting: Vascular Surgery

## 2015-12-25 MED FILL — cloNIDine HCL 0.1 MG TABS: 0.1 | 30 days supply | Qty: 60 | Fill #1

## 2015-12-28 ENCOUNTER — Encounter: Payer: Self-pay | Admitting: Vascular Surgery

## 2015-12-28 ENCOUNTER — Ambulatory Visit (INDEPENDENT_AMBULATORY_CARE_PROVIDER_SITE_OTHER): Payer: 59 | Admitting: Vascular Surgery

## 2015-12-28 VITALS — BP 176/68 | HR 51 | Ht 63.0 in | Wt 199.9 lb

## 2015-12-28 DIAGNOSIS — N184 Chronic kidney disease, stage 4 (severe): Secondary | ICD-10-CM | POA: Diagnosis not present

## 2015-12-28 NOTE — Progress Notes (Signed)
Patient is a 65 year old female who returns for follow-up today. She underwent placement of a left brachial cephalic AV fistula April 2017. She currently is not on hemodialysis. She is being considered for renal transplantation. She denies any numbness or tingling in her hand.  Review of systems: She denies any shortness of breath. She denies any chest pain.   Physical exam:  Vitals:   12/28/15 0903 12/28/15 0904  BP: (!) 181/68 (!) 176/68  Pulse: (!) 51   SpO2: 100%   Weight: 199 lb 14.4 oz (90.7 kg)   Height: 5\' 3"  (1.6 m)     Left upper extremity: Palpable thrill audible bruit fistula is palpable throughout its course  Assessment: Maturing AV fistula left arm  Plan: Fistula is ready for cannulation were needed area patient will follow-up on as-needed basis.  Ruta Hinds, MD [facility] Office: [phone] Pager: [phone]

## 2016-01-01 MED FILL — SODIUM BICARB 650 MG TABLET: 650 | 30 days supply | Qty: 90 | Fill #1

## 2016-01-01 MED FILL — METOPROLOL SUCC ER 100 MG T: 100 | 90 days supply | Qty: 90 | Fill #2

## 2016-01-09 DIAGNOSIS — E669 Obesity, unspecified: Secondary | ICD-10-CM | POA: Diagnosis not present

## 2016-01-09 DIAGNOSIS — E1142 Type 2 diabetes mellitus with diabetic polyneuropathy: Secondary | ICD-10-CM | POA: Diagnosis not present

## 2016-01-09 DIAGNOSIS — I120 Hypertensive chronic kidney disease with stage 5 chronic kidney disease or end stage renal disease: Secondary | ICD-10-CM | POA: Diagnosis not present

## 2016-01-09 DIAGNOSIS — E1122 Type 2 diabetes mellitus with diabetic chronic kidney disease: Secondary | ICD-10-CM | POA: Diagnosis not present

## 2016-01-09 DIAGNOSIS — N186 End stage renal disease: Secondary | ICD-10-CM | POA: Diagnosis not present

## 2016-01-09 DIAGNOSIS — Z01818 Encounter for other preprocedural examination: Secondary | ICD-10-CM | POA: Diagnosis not present

## 2016-01-17 DIAGNOSIS — Z1389 Encounter for screening for other disorder: Secondary | ICD-10-CM | POA: Diagnosis not present

## 2016-01-17 DIAGNOSIS — Z6836 Body mass index (BMI) 36.0-36.9, adult: Secondary | ICD-10-CM | POA: Diagnosis not present

## 2016-01-17 DIAGNOSIS — I10 Essential (primary) hypertension: Secondary | ICD-10-CM | POA: Diagnosis not present

## 2016-01-25 ENCOUNTER — Other Ambulatory Visit (HOSPITAL_COMMUNITY): Payer: Self-pay | Admitting: *Deleted

## 2016-01-26 ENCOUNTER — Encounter (HOSPITAL_COMMUNITY)
Admission: RE | Admit: 2016-01-26 | Discharge: 2016-01-26 | Disposition: A | Discharge status: Home / Self Care | Payer: 59 | Source: Ambulatory Visit | Attending: Nephrology | Admitting: Nephrology

## 2016-01-26 DIAGNOSIS — Z79899 Other long term (current) drug therapy: Secondary | ICD-10-CM | POA: Insufficient documentation

## 2016-01-26 DIAGNOSIS — D631 Anemia in chronic kidney disease: Secondary | ICD-10-CM | POA: Diagnosis not present

## 2016-01-26 DIAGNOSIS — Z5181 Encounter for therapeutic drug level monitoring: Secondary | ICD-10-CM | POA: Diagnosis not present

## 2016-01-26 DIAGNOSIS — N183 Chronic kidney disease, stage 3 (moderate): Secondary | ICD-10-CM | POA: Diagnosis not present

## 2016-01-26 LAB — IRON AND TIBC
IRON: 77 ug/dL (ref 28–170)
Saturation Ratios: 29 % (ref 10.4–31.8)
TIBC: 265 ug/dL (ref 250–450)
UIBC: 188 ug/dL

## 2016-01-26 LAB — POCT HEMOGLOBIN-HEMACUE: HEMOGLOBIN: 9.3 g/dL — AB (ref 12.0–15.0)

## 2016-01-26 LAB — FERRITIN: FERRITIN: 487 ng/mL — AB (ref 11–307)

## 2016-01-26 MED ORDER — EPOETIN ALFA 20000 UNIT/ML IJ SOLN
INTRAMUSCULAR | Status: AC
  Administered 2016-01-26: 20000 [IU]
  Filled 2016-01-26: qty 1

## 2016-01-26 MED ORDER — SODIUM CHLORIDE 0.9 % IV SOLN
510.0000 mg | Freq: Once | INTRAVENOUS | Status: AC
  Administered 2016-01-26: 510 mg via INTRAVENOUS
  Filled 2016-01-26: qty 17

## 2016-01-26 MED ORDER — EPOETIN ALFA 10000 UNIT/ML IJ SOLN
INTRAMUSCULAR | Status: AC
  Administered 2016-01-26: 10000 [IU]
  Filled 2016-01-26: qty 1

## 2016-01-26 MED ORDER — EPOETIN ALFA 20000 UNIT/ML IJ SOLN: 20000.0000 [IU] | Freq: Once | INTRAMUSCULAR | Status: DC

## 2016-01-26 MED ORDER — EPOETIN ALFA 10000 UNIT/ML IJ SOLN: 10000.0000 [IU] | Freq: Once | INTRAMUSCULAR | Status: DC

## 2016-01-26 MED FILL — FUROSEMIDE 80 MG TABLET: 80 | 28 days supply | Qty: 7 | Fill #0

## 2016-01-26 MED FILL — cloNIDine HCL 0.1 MG TABS: 0.1 | 30 days supply | Qty: 60 | Fill #2

## 2016-02-15 DIAGNOSIS — D351 Benign neoplasm of parathyroid gland: Secondary | ICD-10-CM | POA: Diagnosis not present

## 2016-02-22 MED FILL — FUROSEMIDE 80 MG TABLET: 80 | 28 days supply | Qty: 7 | Fill #1

## 2016-02-27 DIAGNOSIS — N186 End stage renal disease: Secondary | ICD-10-CM | POA: Diagnosis not present

## 2016-02-27 DIAGNOSIS — E1140 Type 2 diabetes mellitus with diabetic neuropathy, unspecified: Secondary | ICD-10-CM | POA: Diagnosis not present

## 2016-02-27 DIAGNOSIS — Z01818 Encounter for other preprocedural examination: Secondary | ICD-10-CM | POA: Diagnosis not present

## 2016-02-27 DIAGNOSIS — J9811 Atelectasis: Secondary | ICD-10-CM | POA: Diagnosis not present

## 2016-02-27 DIAGNOSIS — E0840 Diabetes mellitus due to underlying condition with diabetic neuropathy, unspecified: Secondary | ICD-10-CM | POA: Diagnosis not present

## 2016-02-27 DIAGNOSIS — E669 Obesity, unspecified: Secondary | ICD-10-CM | POA: Diagnosis not present

## 2016-02-27 DIAGNOSIS — J90 Pleural effusion, not elsewhere classified: Secondary | ICD-10-CM | POA: Diagnosis not present

## 2016-02-27 DIAGNOSIS — K5730 Diverticulosis of large intestine without perforation or abscess without bleeding: Secondary | ICD-10-CM | POA: Diagnosis not present

## 2016-02-27 DIAGNOSIS — N185 Chronic kidney disease, stage 5: Secondary | ICD-10-CM | POA: Diagnosis not present

## 2016-02-27 DIAGNOSIS — E1122 Type 2 diabetes mellitus with diabetic chronic kidney disease: Secondary | ICD-10-CM | POA: Diagnosis not present

## 2016-02-27 DIAGNOSIS — D631 Anemia in chronic kidney disease: Secondary | ICD-10-CM | POA: Diagnosis not present

## 2016-02-27 DIAGNOSIS — I120 Hypertensive chronic kidney disease with stage 5 chronic kidney disease or end stage renal disease: Secondary | ICD-10-CM | POA: Diagnosis not present

## 2016-02-29 MED FILL — FUROSEMIDE 80 MG TABLET: 80 | 30 days supply | Qty: 30 | Fill #0

## 2016-03-06 DIAGNOSIS — C541 Malignant neoplasm of endometrium: Secondary | ICD-10-CM | POA: Diagnosis not present

## 2016-03-06 DIAGNOSIS — Z6834 Body mass index (BMI) 34.0-34.9, adult: Secondary | ICD-10-CM | POA: Diagnosis not present

## 2016-03-06 DIAGNOSIS — E119 Type 2 diabetes mellitus without complications: Secondary | ICD-10-CM | POA: Diagnosis not present

## 2016-03-06 DIAGNOSIS — I10 Essential (primary) hypertension: Secondary | ICD-10-CM | POA: Diagnosis not present

## 2016-03-06 DIAGNOSIS — N185 Chronic kidney disease, stage 5: Secondary | ICD-10-CM | POA: Diagnosis not present

## 2016-03-06 MED FILL — AMLODIPINE BESYLATE 10 MG T: 10 | 90 days supply | Qty: 90 | Fill #2

## 2016-03-06 MED FILL — glyBURIDE 5 MG TABS: 5 | 30 days supply | Qty: 60 | Fill #0

## 2016-03-06 MED FILL — cloNIDine HCL 0.1 MG TABS: 0.1 | 30 days supply | Qty: 120 | Fill #0

## 2016-03-12 DIAGNOSIS — N185 Chronic kidney disease, stage 5: Secondary | ICD-10-CM | POA: Diagnosis not present

## 2016-03-12 DIAGNOSIS — I10 Essential (primary) hypertension: Secondary | ICD-10-CM | POA: Diagnosis not present

## 2016-03-12 DIAGNOSIS — R600 Localized edema: Secondary | ICD-10-CM | POA: Diagnosis not present

## 2016-03-12 DIAGNOSIS — E1122 Type 2 diabetes mellitus with diabetic chronic kidney disease: Secondary | ICD-10-CM | POA: Diagnosis not present

## 2016-03-12 DIAGNOSIS — E669 Obesity, unspecified: Secondary | ICD-10-CM | POA: Diagnosis not present

## 2016-03-12 DIAGNOSIS — D649 Anemia, unspecified: Secondary | ICD-10-CM | POA: Diagnosis not present

## 2016-03-12 DIAGNOSIS — E119 Type 2 diabetes mellitus without complications: Secondary | ICD-10-CM | POA: Diagnosis not present

## 2016-03-18 DIAGNOSIS — R931 Abnormal findings on diagnostic imaging of heart and coronary circulation: Secondary | ICD-10-CM | POA: Diagnosis not present

## 2016-03-18 DIAGNOSIS — I272 Pulmonary hypertension, unspecified: Secondary | ICD-10-CM | POA: Diagnosis not present

## 2016-03-18 DIAGNOSIS — I340 Nonrheumatic mitral (valve) insufficiency: Secondary | ICD-10-CM | POA: Diagnosis not present

## 2016-03-18 DIAGNOSIS — I120 Hypertensive chronic kidney disease with stage 5 chronic kidney disease or end stage renal disease: Secondary | ICD-10-CM | POA: Diagnosis not present

## 2016-03-18 DIAGNOSIS — E1122 Type 2 diabetes mellitus with diabetic chronic kidney disease: Secondary | ICD-10-CM | POA: Diagnosis not present

## 2016-03-18 DIAGNOSIS — Z01818 Encounter for other preprocedural examination: Secondary | ICD-10-CM | POA: Diagnosis not present

## 2016-03-18 DIAGNOSIS — N186 End stage renal disease: Secondary | ICD-10-CM | POA: Diagnosis not present

## 2016-03-18 DIAGNOSIS — I071 Rheumatic tricuspid insufficiency: Secondary | ICD-10-CM | POA: Diagnosis not present

## 2016-03-18 DIAGNOSIS — I493 Ventricular premature depolarization: Secondary | ICD-10-CM | POA: Diagnosis not present

## 2016-03-20 ENCOUNTER — Encounter (HOSPITAL_COMMUNITY)
Admission: RE | Admit: 2016-03-20 | Discharge: 2016-03-20 | Disposition: A | Discharge status: Home / Self Care | Payer: 59 | Source: Ambulatory Visit | Attending: Nephrology | Admitting: Nephrology

## 2016-03-20 DIAGNOSIS — D631 Anemia in chronic kidney disease: Secondary | ICD-10-CM | POA: Insufficient documentation

## 2016-03-20 DIAGNOSIS — Z5181 Encounter for therapeutic drug level monitoring: Secondary | ICD-10-CM | POA: Diagnosis not present

## 2016-03-20 DIAGNOSIS — N183 Chronic kidney disease, stage 3 (moderate): Secondary | ICD-10-CM | POA: Diagnosis not present

## 2016-03-20 DIAGNOSIS — Z79899 Other long term (current) drug therapy: Secondary | ICD-10-CM | POA: Insufficient documentation

## 2016-03-20 LAB — RENAL FUNCTION PANEL
ALBUMIN: 2.8 g/dL — AB (ref 3.5–5.0)
ANION GAP: 8 (ref 5–15)
BUN: 79 mg/dL — AB (ref 6–20)
CALCIUM: 7.2 mg/dL — AB (ref 8.9–10.3)
CO2: 17 mmol/L — ABNORMAL LOW (ref 22–32)
Chloride: 115 mmol/L — ABNORMAL HIGH (ref 101–111)
Creatinine, Ser: 8.54 mg/dL — ABNORMAL HIGH (ref 0.44–1.00)
GFR calc Af Amer: 5 mL/min — ABNORMAL LOW (ref >60)
GFR, EST NON AFRICAN AMERICAN: 4 mL/min — AB (ref >60)
GLUCOSE: 101 mg/dL — AB (ref 65–99)
PHOSPHORUS: 6 mg/dL — AB (ref 2.5–4.6)
Potassium: 5 mmol/L (ref 3.5–5.1)
SODIUM: 140 mmol/L (ref 135–145)

## 2016-03-20 LAB — IRON AND TIBC
Iron: 85 ug/dL (ref 28–170)
Saturation Ratios: 31 % (ref 10.4–31.8)
TIBC: 273 ug/dL (ref 250–450)
UIBC: 188 ug/dL

## 2016-03-20 LAB — POCT HEMOGLOBIN-HEMACUE: Hemoglobin: 8.4 g/dL — ABNORMAL LOW (ref 12.0–15.0)

## 2016-03-20 LAB — FERRITIN: Ferritin: 655 ng/mL — ABNORMAL HIGH (ref 11–307)

## 2016-03-20 MED ORDER — EPOETIN ALFA 10000 UNIT/ML IJ SOLN
INTRAMUSCULAR | Status: AC
  Filled 2016-03-20: qty 1

## 2016-03-20 MED ORDER — EPOETIN ALFA 20000 UNIT/ML IJ SOLN
INTRAMUSCULAR | Status: AC
  Administered 2016-03-20: 20000 [IU] via SUBCUTANEOUS
  Filled 2016-03-20: qty 1

## 2016-03-20 MED ORDER — EPOETIN ALFA 10000 UNIT/ML IJ SOLN
30000.0000 [IU] | Freq: Once | INTRAMUSCULAR | Status: AC
  Administered 2016-03-20: 10000 [IU] via SUBCUTANEOUS

## 2016-03-21 LAB — PARATHYROID HORMONE, INTACT (NO CA): PTH: 589 pg/mL — ABNORMAL HIGH (ref 15–65)

## 2016-04-08 DIAGNOSIS — E1129 Type 2 diabetes mellitus with other diabetic kidney complication: Secondary | ICD-10-CM | POA: Diagnosis not present

## 2016-04-08 DIAGNOSIS — D631 Anemia in chronic kidney disease: Secondary | ICD-10-CM | POA: Diagnosis not present

## 2016-04-08 DIAGNOSIS — I120 Hypertensive chronic kidney disease with stage 5 chronic kidney disease or end stage renal disease: Secondary | ICD-10-CM | POA: Diagnosis not present

## 2016-04-08 DIAGNOSIS — N185 Chronic kidney disease, stage 5: Secondary | ICD-10-CM | POA: Diagnosis not present

## 2016-04-08 DIAGNOSIS — I10 Essential (primary) hypertension: Secondary | ICD-10-CM | POA: Diagnosis not present

## 2016-04-08 DIAGNOSIS — E119 Type 2 diabetes mellitus without complications: Secondary | ICD-10-CM | POA: Diagnosis not present

## 2016-04-08 DIAGNOSIS — Z6834 Body mass index (BMI) 34.0-34.9, adult: Secondary | ICD-10-CM | POA: Diagnosis not present

## 2016-04-10 DIAGNOSIS — R600 Localized edema: Secondary | ICD-10-CM | POA: Diagnosis not present

## 2016-04-10 DIAGNOSIS — N19 Unspecified kidney failure: Secondary | ICD-10-CM | POA: Diagnosis not present

## 2016-04-10 DIAGNOSIS — D649 Anemia, unspecified: Secondary | ICD-10-CM | POA: Diagnosis not present

## 2016-04-10 DIAGNOSIS — I10 Essential (primary) hypertension: Secondary | ICD-10-CM | POA: Diagnosis not present

## 2016-04-10 DIAGNOSIS — E1122 Type 2 diabetes mellitus with diabetic chronic kidney disease: Secondary | ICD-10-CM | POA: Diagnosis not present

## 2016-04-10 DIAGNOSIS — N185 Chronic kidney disease, stage 5: Secondary | ICD-10-CM | POA: Diagnosis not present

## 2016-04-10 DIAGNOSIS — E119 Type 2 diabetes mellitus without complications: Secondary | ICD-10-CM | POA: Diagnosis not present

## 2016-04-10 MED FILL — CALCIUM ACETATE 667 MG CAP: 667 | 90 days supply | Qty: 90 | Fill #0

## 2016-04-10 MED FILL — glyBURIDE 5 MG TABS: 5 | 30 days supply | Qty: 60 | Fill #1

## 2016-04-10 MED FILL — LIDOCAINE-PRILOCAINE CREAM: 2.5-2.5 | 10 days supply | Qty: 30 | Fill #0

## 2016-04-11 MED FILL — METOPROLOL SUCC ER 100 MG T: 100 | 90 days supply | Qty: 90 | Fill #3

## 2016-04-11 MED FILL — FUROSEMIDE 80 MG TABLET: 80 | 30 days supply | Qty: 30 | Fill #1

## 2016-04-11 MED FILL — cloNIDine HCL 0.1 MG TABS: 0.1 | 30 days supply | Qty: 120 | Fill #1

## 2016-04-19 ENCOUNTER — Other Ambulatory Visit (HOSPITAL_COMMUNITY): Payer: Self-pay | Admitting: *Deleted

## 2016-04-22 ENCOUNTER — Encounter (HOSPITAL_COMMUNITY)
Admission: RE | Admit: 2016-04-22 | Discharge: 2016-04-22 | Disposition: A | Discharge status: Home / Self Care | Payer: 59 | Source: Ambulatory Visit | Attending: Nephrology | Admitting: Nephrology

## 2016-04-22 DIAGNOSIS — Z79899 Other long term (current) drug therapy: Secondary | ICD-10-CM | POA: Insufficient documentation

## 2016-04-22 DIAGNOSIS — Z5181 Encounter for therapeutic drug level monitoring: Secondary | ICD-10-CM | POA: Insufficient documentation

## 2016-04-22 DIAGNOSIS — D631 Anemia in chronic kidney disease: Secondary | ICD-10-CM | POA: Insufficient documentation

## 2016-04-22 DIAGNOSIS — N183 Chronic kidney disease, stage 3 (moderate): Secondary | ICD-10-CM | POA: Insufficient documentation

## 2016-04-22 LAB — IRON AND TIBC
IRON: 95 ug/dL (ref 28–170)
SATURATION RATIOS: 34 % — AB (ref 10.4–31.8)
TIBC: 280 ug/dL (ref 250–450)
UIBC: 185 ug/dL

## 2016-04-22 LAB — CBC WITH DIFFERENTIAL/PLATELET
BASOS ABS: 0 10*3/uL (ref 0.0–0.1)
BASOS PCT: 0 %
Eosinophils Absolute: 0 10*3/uL (ref 0.0–0.7)
Eosinophils Relative: 1 %
HEMATOCRIT: 27.8 % — AB (ref 36.0–46.0)
HEMOGLOBIN: 9.4 g/dL — AB (ref 12.0–15.0)
LYMPHS PCT: 13 %
Lymphs Abs: 0.6 10*3/uL — ABNORMAL LOW (ref 0.7–4.0)
MCH: 28.7 pg (ref 26.0–34.0)
MCHC: 33.8 g/dL (ref 30.0–36.0)
MCV: 85 fL (ref 78.0–100.0)
MONO ABS: 0.2 10*3/uL (ref 0.1–1.0)
Monocytes Relative: 5 %
NEUTROS ABS: 3.9 10*3/uL (ref 1.7–7.7)
NEUTROS PCT: 81 %
Platelets: 190 10*3/uL (ref 150–400)
RBC: 3.27 MIL/uL — AB (ref 3.87–5.11)
RDW: 15.3 % (ref 11.5–15.5)
WBC: 4.7 10*3/uL (ref 4.0–10.5)

## 2016-04-22 LAB — RENAL FUNCTION PANEL
ALBUMIN: 3 g/dL — AB (ref 3.5–5.0)
ANION GAP: 9 (ref 5–15)
BUN: 86 mg/dL — AB (ref 6–20)
CALCIUM: 7.1 mg/dL — AB (ref 8.9–10.3)
CO2: 17 mmol/L — AB (ref 22–32)
CREATININE: 9.21 mg/dL — AB (ref 0.44–1.00)
Chloride: 115 mmol/L — ABNORMAL HIGH (ref 101–111)
GFR calc Af Amer: 5 mL/min — ABNORMAL LOW (ref >60)
GFR calc non Af Amer: 4 mL/min — ABNORMAL LOW (ref >60)
GLUCOSE: 98 mg/dL (ref 65–99)
PHOSPHORUS: 6.5 mg/dL — AB (ref 2.5–4.6)
Potassium: 4.7 mmol/L (ref 3.5–5.1)
SODIUM: 141 mmol/L (ref 135–145)

## 2016-04-22 LAB — FERRITIN: Ferritin: 702 ng/mL — ABNORMAL HIGH (ref 11–307)

## 2016-04-22 MED ORDER — EPOETIN ALFA 20000 UNIT/ML IJ SOLN
INTRAMUSCULAR | Status: AC
  Administered 2016-04-22: 20000 [IU]
  Filled 2016-04-22: qty 1

## 2016-04-22 MED ORDER — EPOETIN ALFA 10000 UNIT/ML IJ SOLN
INTRAMUSCULAR | Status: AC
  Administered 2016-04-22: 10000 [IU]
  Filled 2016-04-22: qty 1

## 2016-04-22 MED ORDER — SODIUM CHLORIDE 0.9 % IV SOLN
510.0000 mg | Freq: Once | INTRAVENOUS | Status: AC
  Administered 2016-04-22: 510 mg via INTRAVENOUS
  Filled 2016-04-22: qty 17

## 2016-04-22 MED ORDER — ACETAMINOPHEN 500 MG PO TABS
ORAL_TABLET | ORAL | Status: AC
  Filled 2016-04-22: qty 2

## 2016-04-22 MED ORDER — EPOETIN ALFA 20000 UNIT/ML IJ SOLN: 30000.0000 [IU] | Freq: Once | INTRAMUSCULAR | Status: DC

## 2016-04-23 LAB — HEPATITIS B SURFACE ANTIGEN: HEP B S AG: NEGATIVE

## 2016-04-24 DIAGNOSIS — N2581 Secondary hyperparathyroidism of renal origin: Secondary | ICD-10-CM | POA: Diagnosis not present

## 2016-04-24 DIAGNOSIS — E1129 Type 2 diabetes mellitus with other diabetic kidney complication: Secondary | ICD-10-CM | POA: Diagnosis not present

## 2016-04-24 DIAGNOSIS — D509 Iron deficiency anemia, unspecified: Secondary | ICD-10-CM | POA: Diagnosis not present

## 2016-04-24 DIAGNOSIS — N186 End stage renal disease: Secondary | ICD-10-CM | POA: Diagnosis not present

## 2016-04-24 DIAGNOSIS — D689 Coagulation defect, unspecified: Secondary | ICD-10-CM | POA: Diagnosis not present

## 2016-04-24 DIAGNOSIS — Z111 Encounter for screening for respiratory tuberculosis: Secondary | ICD-10-CM | POA: Diagnosis not present

## 2016-04-26 DIAGNOSIS — N186 End stage renal disease: Secondary | ICD-10-CM | POA: Diagnosis not present

## 2016-04-26 DIAGNOSIS — D509 Iron deficiency anemia, unspecified: Secondary | ICD-10-CM | POA: Diagnosis not present

## 2016-04-26 DIAGNOSIS — D689 Coagulation defect, unspecified: Secondary | ICD-10-CM | POA: Diagnosis not present

## 2016-04-26 DIAGNOSIS — N2581 Secondary hyperparathyroidism of renal origin: Secondary | ICD-10-CM | POA: Diagnosis not present

## 2016-04-26 MED FILL — CALCIUM ACETATE 667 MG CAP: 667 | 90 days supply | Qty: 720 | Fill #0

## 2016-04-30 DIAGNOSIS — N2581 Secondary hyperparathyroidism of renal origin: Secondary | ICD-10-CM | POA: Diagnosis not present

## 2016-04-30 DIAGNOSIS — D631 Anemia in chronic kidney disease: Secondary | ICD-10-CM | POA: Diagnosis not present

## 2016-04-30 DIAGNOSIS — D689 Coagulation defect, unspecified: Secondary | ICD-10-CM | POA: Diagnosis not present

## 2016-04-30 DIAGNOSIS — D509 Iron deficiency anemia, unspecified: Secondary | ICD-10-CM | POA: Diagnosis not present

## 2016-04-30 DIAGNOSIS — N186 End stage renal disease: Secondary | ICD-10-CM | POA: Diagnosis not present

## 2016-05-01 DIAGNOSIS — N186 End stage renal disease: Secondary | ICD-10-CM | POA: Diagnosis not present

## 2016-05-01 DIAGNOSIS — D509 Iron deficiency anemia, unspecified: Secondary | ICD-10-CM | POA: Diagnosis not present

## 2016-05-01 DIAGNOSIS — D689 Coagulation defect, unspecified: Secondary | ICD-10-CM | POA: Diagnosis not present

## 2016-05-01 DIAGNOSIS — N2581 Secondary hyperparathyroidism of renal origin: Secondary | ICD-10-CM | POA: Diagnosis not present

## 2016-05-01 DIAGNOSIS — D631 Anemia in chronic kidney disease: Secondary | ICD-10-CM | POA: Diagnosis not present

## 2016-05-03 DIAGNOSIS — D631 Anemia in chronic kidney disease: Secondary | ICD-10-CM | POA: Diagnosis not present

## 2016-05-03 DIAGNOSIS — D689 Coagulation defect, unspecified: Secondary | ICD-10-CM | POA: Diagnosis not present

## 2016-05-03 DIAGNOSIS — N2581 Secondary hyperparathyroidism of renal origin: Secondary | ICD-10-CM | POA: Diagnosis not present

## 2016-05-03 DIAGNOSIS — N186 End stage renal disease: Secondary | ICD-10-CM | POA: Diagnosis not present

## 2016-05-03 DIAGNOSIS — D509 Iron deficiency anemia, unspecified: Secondary | ICD-10-CM | POA: Diagnosis not present

## 2016-05-06 DIAGNOSIS — D509 Iron deficiency anemia, unspecified: Secondary | ICD-10-CM | POA: Diagnosis not present

## 2016-05-06 DIAGNOSIS — D689 Coagulation defect, unspecified: Secondary | ICD-10-CM | POA: Diagnosis not present

## 2016-05-06 DIAGNOSIS — N186 End stage renal disease: Secondary | ICD-10-CM | POA: Diagnosis not present

## 2016-05-06 DIAGNOSIS — N2581 Secondary hyperparathyroidism of renal origin: Secondary | ICD-10-CM | POA: Diagnosis not present

## 2016-05-08 DIAGNOSIS — D689 Coagulation defect, unspecified: Secondary | ICD-10-CM | POA: Diagnosis not present

## 2016-05-08 DIAGNOSIS — N186 End stage renal disease: Secondary | ICD-10-CM | POA: Diagnosis not present

## 2016-05-08 DIAGNOSIS — N2581 Secondary hyperparathyroidism of renal origin: Secondary | ICD-10-CM | POA: Diagnosis not present

## 2016-05-08 DIAGNOSIS — D509 Iron deficiency anemia, unspecified: Secondary | ICD-10-CM | POA: Diagnosis not present

## 2016-05-10 DIAGNOSIS — D509 Iron deficiency anemia, unspecified: Secondary | ICD-10-CM | POA: Diagnosis not present

## 2016-05-10 DIAGNOSIS — D689 Coagulation defect, unspecified: Secondary | ICD-10-CM | POA: Diagnosis not present

## 2016-05-10 DIAGNOSIS — N186 End stage renal disease: Secondary | ICD-10-CM | POA: Diagnosis not present

## 2016-05-10 DIAGNOSIS — N2581 Secondary hyperparathyroidism of renal origin: Secondary | ICD-10-CM | POA: Diagnosis not present

## 2016-05-13 DIAGNOSIS — D631 Anemia in chronic kidney disease: Secondary | ICD-10-CM | POA: Diagnosis not present

## 2016-05-13 DIAGNOSIS — N186 End stage renal disease: Secondary | ICD-10-CM | POA: Diagnosis not present

## 2016-05-13 DIAGNOSIS — D509 Iron deficiency anemia, unspecified: Secondary | ICD-10-CM | POA: Diagnosis not present

## 2016-05-13 DIAGNOSIS — N2581 Secondary hyperparathyroidism of renal origin: Secondary | ICD-10-CM | POA: Diagnosis not present

## 2016-05-13 DIAGNOSIS — D689 Coagulation defect, unspecified: Secondary | ICD-10-CM | POA: Diagnosis not present

## 2016-05-13 MED FILL — LIDOCAINE-PRILOCAINE CREAM: 2.5-2.5 | 10 days supply | Qty: 30 | Fill #1

## 2016-05-15 DIAGNOSIS — D689 Coagulation defect, unspecified: Secondary | ICD-10-CM | POA: Diagnosis not present

## 2016-05-15 DIAGNOSIS — N186 End stage renal disease: Secondary | ICD-10-CM | POA: Diagnosis not present

## 2016-05-15 DIAGNOSIS — N2581 Secondary hyperparathyroidism of renal origin: Secondary | ICD-10-CM | POA: Diagnosis not present

## 2016-05-15 DIAGNOSIS — D631 Anemia in chronic kidney disease: Secondary | ICD-10-CM | POA: Diagnosis not present

## 2016-05-15 DIAGNOSIS — D509 Iron deficiency anemia, unspecified: Secondary | ICD-10-CM | POA: Diagnosis not present

## 2016-05-17 DIAGNOSIS — N2581 Secondary hyperparathyroidism of renal origin: Secondary | ICD-10-CM | POA: Diagnosis not present

## 2016-05-17 DIAGNOSIS — D631 Anemia in chronic kidney disease: Secondary | ICD-10-CM | POA: Diagnosis not present

## 2016-05-17 DIAGNOSIS — N186 End stage renal disease: Secondary | ICD-10-CM | POA: Diagnosis not present

## 2016-05-17 DIAGNOSIS — D689 Coagulation defect, unspecified: Secondary | ICD-10-CM | POA: Diagnosis not present

## 2016-05-17 DIAGNOSIS — D509 Iron deficiency anemia, unspecified: Secondary | ICD-10-CM | POA: Diagnosis not present

## 2016-05-19 DIAGNOSIS — D689 Coagulation defect, unspecified: Secondary | ICD-10-CM | POA: Diagnosis not present

## 2016-05-19 DIAGNOSIS — N2581 Secondary hyperparathyroidism of renal origin: Secondary | ICD-10-CM | POA: Diagnosis not present

## 2016-05-19 DIAGNOSIS — E1129 Type 2 diabetes mellitus with other diabetic kidney complication: Secondary | ICD-10-CM | POA: Diagnosis not present

## 2016-05-19 DIAGNOSIS — N186 End stage renal disease: Secondary | ICD-10-CM | POA: Diagnosis not present

## 2016-05-22 DIAGNOSIS — E1129 Type 2 diabetes mellitus with other diabetic kidney complication: Secondary | ICD-10-CM | POA: Diagnosis not present

## 2016-05-22 DIAGNOSIS — D689 Coagulation defect, unspecified: Secondary | ICD-10-CM | POA: Diagnosis not present

## 2016-05-22 DIAGNOSIS — N2581 Secondary hyperparathyroidism of renal origin: Secondary | ICD-10-CM | POA: Diagnosis not present

## 2016-05-22 DIAGNOSIS — N186 End stage renal disease: Secondary | ICD-10-CM | POA: Diagnosis not present

## 2016-05-24 DIAGNOSIS — N2581 Secondary hyperparathyroidism of renal origin: Secondary | ICD-10-CM | POA: Diagnosis not present

## 2016-05-24 DIAGNOSIS — N186 End stage renal disease: Secondary | ICD-10-CM | POA: Diagnosis not present

## 2016-05-24 DIAGNOSIS — D689 Coagulation defect, unspecified: Secondary | ICD-10-CM | POA: Diagnosis not present

## 2016-05-24 DIAGNOSIS — E1129 Type 2 diabetes mellitus with other diabetic kidney complication: Secondary | ICD-10-CM | POA: Diagnosis not present

## 2016-05-26 DIAGNOSIS — D689 Coagulation defect, unspecified: Secondary | ICD-10-CM | POA: Diagnosis not present

## 2016-05-26 DIAGNOSIS — E1129 Type 2 diabetes mellitus with other diabetic kidney complication: Secondary | ICD-10-CM | POA: Diagnosis not present

## 2016-05-26 DIAGNOSIS — N186 End stage renal disease: Secondary | ICD-10-CM | POA: Diagnosis not present

## 2016-05-26 DIAGNOSIS — N2581 Secondary hyperparathyroidism of renal origin: Secondary | ICD-10-CM | POA: Diagnosis not present

## 2016-05-26 DIAGNOSIS — Z992 Dependence on renal dialysis: Secondary | ICD-10-CM | POA: Diagnosis not present

## 2016-05-29 DIAGNOSIS — N186 End stage renal disease: Secondary | ICD-10-CM | POA: Diagnosis not present

## 2016-05-29 DIAGNOSIS — N2581 Secondary hyperparathyroidism of renal origin: Secondary | ICD-10-CM | POA: Diagnosis not present

## 2016-05-29 DIAGNOSIS — D689 Coagulation defect, unspecified: Secondary | ICD-10-CM | POA: Diagnosis not present

## 2016-05-29 DIAGNOSIS — D509 Iron deficiency anemia, unspecified: Secondary | ICD-10-CM | POA: Diagnosis not present

## 2016-05-29 DIAGNOSIS — D631 Anemia in chronic kidney disease: Secondary | ICD-10-CM | POA: Diagnosis not present

## 2016-05-31 DIAGNOSIS — D631 Anemia in chronic kidney disease: Secondary | ICD-10-CM | POA: Diagnosis not present

## 2016-05-31 DIAGNOSIS — N186 End stage renal disease: Secondary | ICD-10-CM | POA: Diagnosis not present

## 2016-05-31 DIAGNOSIS — D689 Coagulation defect, unspecified: Secondary | ICD-10-CM | POA: Diagnosis not present

## 2016-05-31 DIAGNOSIS — D509 Iron deficiency anemia, unspecified: Secondary | ICD-10-CM | POA: Diagnosis not present

## 2016-05-31 DIAGNOSIS — N2581 Secondary hyperparathyroidism of renal origin: Secondary | ICD-10-CM | POA: Diagnosis not present

## 2016-06-03 DIAGNOSIS — N2581 Secondary hyperparathyroidism of renal origin: Secondary | ICD-10-CM | POA: Diagnosis not present

## 2016-06-03 DIAGNOSIS — N186 End stage renal disease: Secondary | ICD-10-CM | POA: Diagnosis not present

## 2016-06-03 DIAGNOSIS — D689 Coagulation defect, unspecified: Secondary | ICD-10-CM | POA: Diagnosis not present

## 2016-06-03 DIAGNOSIS — D509 Iron deficiency anemia, unspecified: Secondary | ICD-10-CM | POA: Diagnosis not present

## 2016-06-03 DIAGNOSIS — D631 Anemia in chronic kidney disease: Secondary | ICD-10-CM | POA: Diagnosis not present

## 2016-06-05 DIAGNOSIS — D631 Anemia in chronic kidney disease: Secondary | ICD-10-CM | POA: Diagnosis not present

## 2016-06-05 DIAGNOSIS — D689 Coagulation defect, unspecified: Secondary | ICD-10-CM | POA: Diagnosis not present

## 2016-06-05 DIAGNOSIS — D509 Iron deficiency anemia, unspecified: Secondary | ICD-10-CM | POA: Diagnosis not present

## 2016-06-05 DIAGNOSIS — N2581 Secondary hyperparathyroidism of renal origin: Secondary | ICD-10-CM | POA: Diagnosis not present

## 2016-06-05 DIAGNOSIS — N186 End stage renal disease: Secondary | ICD-10-CM | POA: Diagnosis not present

## 2016-06-07 DIAGNOSIS — N186 End stage renal disease: Secondary | ICD-10-CM | POA: Diagnosis not present

## 2016-06-07 DIAGNOSIS — D689 Coagulation defect, unspecified: Secondary | ICD-10-CM | POA: Diagnosis not present

## 2016-06-07 DIAGNOSIS — N2581 Secondary hyperparathyroidism of renal origin: Secondary | ICD-10-CM | POA: Diagnosis not present

## 2016-06-07 DIAGNOSIS — D631 Anemia in chronic kidney disease: Secondary | ICD-10-CM | POA: Diagnosis not present

## 2016-06-07 DIAGNOSIS — D509 Iron deficiency anemia, unspecified: Secondary | ICD-10-CM | POA: Diagnosis not present

## 2016-06-10 DIAGNOSIS — D689 Coagulation defect, unspecified: Secondary | ICD-10-CM | POA: Diagnosis not present

## 2016-06-10 DIAGNOSIS — N2581 Secondary hyperparathyroidism of renal origin: Secondary | ICD-10-CM | POA: Diagnosis not present

## 2016-06-10 DIAGNOSIS — D631 Anemia in chronic kidney disease: Secondary | ICD-10-CM | POA: Diagnosis not present

## 2016-06-10 DIAGNOSIS — D509 Iron deficiency anemia, unspecified: Secondary | ICD-10-CM | POA: Diagnosis not present

## 2016-06-10 DIAGNOSIS — N186 End stage renal disease: Secondary | ICD-10-CM | POA: Diagnosis not present

## 2016-06-12 DIAGNOSIS — N2581 Secondary hyperparathyroidism of renal origin: Secondary | ICD-10-CM | POA: Diagnosis not present

## 2016-06-12 DIAGNOSIS — N186 End stage renal disease: Secondary | ICD-10-CM | POA: Diagnosis not present

## 2016-06-12 DIAGNOSIS — D689 Coagulation defect, unspecified: Secondary | ICD-10-CM | POA: Diagnosis not present

## 2016-06-12 DIAGNOSIS — D631 Anemia in chronic kidney disease: Secondary | ICD-10-CM | POA: Diagnosis not present

## 2016-06-12 DIAGNOSIS — D509 Iron deficiency anemia, unspecified: Secondary | ICD-10-CM | POA: Diagnosis not present

## 2016-06-14 DIAGNOSIS — N2581 Secondary hyperparathyroidism of renal origin: Secondary | ICD-10-CM | POA: Diagnosis not present

## 2016-06-14 DIAGNOSIS — D509 Iron deficiency anemia, unspecified: Secondary | ICD-10-CM | POA: Diagnosis not present

## 2016-06-14 DIAGNOSIS — N186 End stage renal disease: Secondary | ICD-10-CM | POA: Diagnosis not present

## 2016-06-14 DIAGNOSIS — D631 Anemia in chronic kidney disease: Secondary | ICD-10-CM | POA: Diagnosis not present

## 2016-06-14 DIAGNOSIS — D689 Coagulation defect, unspecified: Secondary | ICD-10-CM | POA: Diagnosis not present

## 2016-06-14 MED FILL — AMLODIPINE BESYLATE 10 MG T: 10 | 90 days supply | Qty: 90 | Fill #3

## 2016-06-17 DIAGNOSIS — D689 Coagulation defect, unspecified: Secondary | ICD-10-CM | POA: Diagnosis not present

## 2016-06-17 DIAGNOSIS — N186 End stage renal disease: Secondary | ICD-10-CM | POA: Diagnosis not present

## 2016-06-17 DIAGNOSIS — D631 Anemia in chronic kidney disease: Secondary | ICD-10-CM | POA: Diagnosis not present

## 2016-06-17 DIAGNOSIS — N2581 Secondary hyperparathyroidism of renal origin: Secondary | ICD-10-CM | POA: Diagnosis not present

## 2016-06-17 DIAGNOSIS — D509 Iron deficiency anemia, unspecified: Secondary | ICD-10-CM | POA: Diagnosis not present

## 2016-06-17 DIAGNOSIS — E1129 Type 2 diabetes mellitus with other diabetic kidney complication: Secondary | ICD-10-CM | POA: Diagnosis not present

## 2016-06-19 DIAGNOSIS — D631 Anemia in chronic kidney disease: Secondary | ICD-10-CM | POA: Diagnosis not present

## 2016-06-19 DIAGNOSIS — N186 End stage renal disease: Secondary | ICD-10-CM | POA: Diagnosis not present

## 2016-06-19 DIAGNOSIS — N2581 Secondary hyperparathyroidism of renal origin: Secondary | ICD-10-CM | POA: Diagnosis not present

## 2016-06-19 DIAGNOSIS — E1129 Type 2 diabetes mellitus with other diabetic kidney complication: Secondary | ICD-10-CM | POA: Diagnosis not present

## 2016-06-19 DIAGNOSIS — D689 Coagulation defect, unspecified: Secondary | ICD-10-CM | POA: Diagnosis not present

## 2016-06-19 DIAGNOSIS — D509 Iron deficiency anemia, unspecified: Secondary | ICD-10-CM | POA: Diagnosis not present

## 2016-06-21 DIAGNOSIS — D509 Iron deficiency anemia, unspecified: Secondary | ICD-10-CM | POA: Diagnosis not present

## 2016-06-21 DIAGNOSIS — E1129 Type 2 diabetes mellitus with other diabetic kidney complication: Secondary | ICD-10-CM | POA: Diagnosis not present

## 2016-06-21 DIAGNOSIS — D689 Coagulation defect, unspecified: Secondary | ICD-10-CM | POA: Diagnosis not present

## 2016-06-21 DIAGNOSIS — D631 Anemia in chronic kidney disease: Secondary | ICD-10-CM | POA: Diagnosis not present

## 2016-06-21 DIAGNOSIS — N186 End stage renal disease: Secondary | ICD-10-CM | POA: Diagnosis not present

## 2016-06-21 DIAGNOSIS — N2581 Secondary hyperparathyroidism of renal origin: Secondary | ICD-10-CM | POA: Diagnosis not present

## 2016-06-24 DIAGNOSIS — N186 End stage renal disease: Secondary | ICD-10-CM | POA: Diagnosis not present

## 2016-06-24 DIAGNOSIS — N2581 Secondary hyperparathyroidism of renal origin: Secondary | ICD-10-CM | POA: Diagnosis not present

## 2016-06-24 DIAGNOSIS — D689 Coagulation defect, unspecified: Secondary | ICD-10-CM | POA: Diagnosis not present

## 2016-06-24 MED FILL — LIDOCAINE-PRILOCAINE CREAM: 2.5-2.5 | 10 days supply | Qty: 30 | Fill #2

## 2016-06-26 DIAGNOSIS — Z992 Dependence on renal dialysis: Secondary | ICD-10-CM | POA: Diagnosis not present

## 2016-06-26 DIAGNOSIS — N2581 Secondary hyperparathyroidism of renal origin: Secondary | ICD-10-CM | POA: Diagnosis not present

## 2016-06-26 DIAGNOSIS — E1129 Type 2 diabetes mellitus with other diabetic kidney complication: Secondary | ICD-10-CM | POA: Diagnosis not present

## 2016-06-26 DIAGNOSIS — D689 Coagulation defect, unspecified: Secondary | ICD-10-CM | POA: Diagnosis not present

## 2016-06-26 DIAGNOSIS — N186 End stage renal disease: Secondary | ICD-10-CM | POA: Diagnosis not present

## 2016-06-27 DIAGNOSIS — Z01818 Encounter for other preprocedural examination: Secondary | ICD-10-CM | POA: Diagnosis not present

## 2016-06-28 DIAGNOSIS — D689 Coagulation defect, unspecified: Secondary | ICD-10-CM | POA: Diagnosis not present

## 2016-06-28 DIAGNOSIS — N186 End stage renal disease: Secondary | ICD-10-CM | POA: Diagnosis not present

## 2016-06-28 DIAGNOSIS — N2581 Secondary hyperparathyroidism of renal origin: Secondary | ICD-10-CM | POA: Diagnosis not present

## 2016-07-01 DIAGNOSIS — N2581 Secondary hyperparathyroidism of renal origin: Secondary | ICD-10-CM | POA: Diagnosis not present

## 2016-07-01 DIAGNOSIS — D689 Coagulation defect, unspecified: Secondary | ICD-10-CM | POA: Diagnosis not present

## 2016-07-01 DIAGNOSIS — N186 End stage renal disease: Secondary | ICD-10-CM | POA: Diagnosis not present

## 2016-07-03 DIAGNOSIS — N186 End stage renal disease: Secondary | ICD-10-CM | POA: Diagnosis not present

## 2016-07-03 DIAGNOSIS — N2581 Secondary hyperparathyroidism of renal origin: Secondary | ICD-10-CM | POA: Diagnosis not present

## 2016-07-03 DIAGNOSIS — D689 Coagulation defect, unspecified: Secondary | ICD-10-CM | POA: Diagnosis not present

## 2016-07-05 DIAGNOSIS — N2581 Secondary hyperparathyroidism of renal origin: Secondary | ICD-10-CM | POA: Diagnosis not present

## 2016-07-05 DIAGNOSIS — D689 Coagulation defect, unspecified: Secondary | ICD-10-CM | POA: Diagnosis not present

## 2016-07-05 DIAGNOSIS — N186 End stage renal disease: Secondary | ICD-10-CM | POA: Diagnosis not present

## 2016-07-08 DIAGNOSIS — N186 End stage renal disease: Secondary | ICD-10-CM | POA: Diagnosis not present

## 2016-07-08 DIAGNOSIS — N2581 Secondary hyperparathyroidism of renal origin: Secondary | ICD-10-CM | POA: Diagnosis not present

## 2016-07-08 DIAGNOSIS — D689 Coagulation defect, unspecified: Secondary | ICD-10-CM | POA: Diagnosis not present

## 2016-07-09 ENCOUNTER — Other Ambulatory Visit: Payer: Self-pay | Admitting: Internal Medicine

## 2016-07-09 DIAGNOSIS — Z1231 Encounter for screening mammogram for malignant neoplasm of breast: Secondary | ICD-10-CM

## 2016-07-10 DIAGNOSIS — N2581 Secondary hyperparathyroidism of renal origin: Secondary | ICD-10-CM | POA: Diagnosis not present

## 2016-07-10 DIAGNOSIS — N186 End stage renal disease: Secondary | ICD-10-CM | POA: Diagnosis not present

## 2016-07-10 DIAGNOSIS — D689 Coagulation defect, unspecified: Secondary | ICD-10-CM | POA: Diagnosis not present

## 2016-07-12 DIAGNOSIS — N2581 Secondary hyperparathyroidism of renal origin: Secondary | ICD-10-CM | POA: Diagnosis not present

## 2016-07-12 DIAGNOSIS — D689 Coagulation defect, unspecified: Secondary | ICD-10-CM | POA: Diagnosis not present

## 2016-07-12 DIAGNOSIS — N186 End stage renal disease: Secondary | ICD-10-CM | POA: Diagnosis not present

## 2016-07-15 DIAGNOSIS — E1129 Type 2 diabetes mellitus with other diabetic kidney complication: Secondary | ICD-10-CM | POA: Diagnosis not present

## 2016-07-15 DIAGNOSIS — N186 End stage renal disease: Secondary | ICD-10-CM | POA: Diagnosis not present

## 2016-07-15 DIAGNOSIS — D689 Coagulation defect, unspecified: Secondary | ICD-10-CM | POA: Diagnosis not present

## 2016-07-15 DIAGNOSIS — D509 Iron deficiency anemia, unspecified: Secondary | ICD-10-CM | POA: Diagnosis not present

## 2016-07-15 DIAGNOSIS — D631 Anemia in chronic kidney disease: Secondary | ICD-10-CM | POA: Diagnosis not present

## 2016-07-15 DIAGNOSIS — N2581 Secondary hyperparathyroidism of renal origin: Secondary | ICD-10-CM | POA: Diagnosis not present

## 2016-07-17 DIAGNOSIS — D631 Anemia in chronic kidney disease: Secondary | ICD-10-CM | POA: Diagnosis not present

## 2016-07-17 DIAGNOSIS — D509 Iron deficiency anemia, unspecified: Secondary | ICD-10-CM | POA: Diagnosis not present

## 2016-07-17 DIAGNOSIS — N186 End stage renal disease: Secondary | ICD-10-CM | POA: Diagnosis not present

## 2016-07-17 DIAGNOSIS — E1129 Type 2 diabetes mellitus with other diabetic kidney complication: Secondary | ICD-10-CM | POA: Diagnosis not present

## 2016-07-17 DIAGNOSIS — D689 Coagulation defect, unspecified: Secondary | ICD-10-CM | POA: Diagnosis not present

## 2016-07-17 DIAGNOSIS — N2581 Secondary hyperparathyroidism of renal origin: Secondary | ICD-10-CM | POA: Diagnosis not present

## 2016-07-19 ENCOUNTER — Ambulatory Visit
Admission: RE | Admit: 2016-07-19 | Discharge: 2016-07-19 | Disposition: A | Discharge status: Home / Self Care | Payer: 59 | Source: Ambulatory Visit | Attending: Internal Medicine | Admitting: Internal Medicine

## 2016-07-19 DIAGNOSIS — Z1231 Encounter for screening mammogram for malignant neoplasm of breast: Secondary | ICD-10-CM

## 2016-07-19 DIAGNOSIS — D509 Iron deficiency anemia, unspecified: Secondary | ICD-10-CM | POA: Diagnosis not present

## 2016-07-19 DIAGNOSIS — N2581 Secondary hyperparathyroidism of renal origin: Secondary | ICD-10-CM | POA: Diagnosis not present

## 2016-07-19 DIAGNOSIS — E1129 Type 2 diabetes mellitus with other diabetic kidney complication: Secondary | ICD-10-CM | POA: Diagnosis not present

## 2016-07-19 DIAGNOSIS — N186 End stage renal disease: Secondary | ICD-10-CM | POA: Diagnosis not present

## 2016-07-19 DIAGNOSIS — D689 Coagulation defect, unspecified: Secondary | ICD-10-CM | POA: Diagnosis not present

## 2016-07-19 DIAGNOSIS — D631 Anemia in chronic kidney disease: Secondary | ICD-10-CM | POA: Diagnosis not present

## 2016-07-19 MED FILL — LIDOCAINE-PRILOCAINE CREAM: 2.5-2.5 | 10 days supply | Qty: 30 | Fill #3

## 2016-07-22 DIAGNOSIS — Z992 Dependence on renal dialysis: Secondary | ICD-10-CM | POA: Diagnosis not present

## 2016-07-22 DIAGNOSIS — I120 Hypertensive chronic kidney disease with stage 5 chronic kidney disease or end stage renal disease: Secondary | ICD-10-CM | POA: Diagnosis not present

## 2016-07-22 DIAGNOSIS — D649 Anemia, unspecified: Secondary | ICD-10-CM | POA: Diagnosis not present

## 2016-07-22 DIAGNOSIS — N186 End stage renal disease: Secondary | ICD-10-CM | POA: Diagnosis not present

## 2016-07-22 DIAGNOSIS — I1311 Hypertensive heart and chronic kidney disease without heart failure, with stage 5 chronic kidney disease, or end stage renal disease: Secondary | ICD-10-CM | POA: Diagnosis not present

## 2016-07-22 DIAGNOSIS — I2510 Atherosclerotic heart disease of native coronary artery without angina pectoris: Secondary | ICD-10-CM | POA: Diagnosis not present

## 2016-07-22 DIAGNOSIS — Z7982 Long term (current) use of aspirin: Secondary | ICD-10-CM | POA: Diagnosis not present

## 2016-07-22 DIAGNOSIS — Z01810 Encounter for preprocedural cardiovascular examination: Secondary | ICD-10-CM | POA: Diagnosis not present

## 2016-07-22 DIAGNOSIS — E1122 Type 2 diabetes mellitus with diabetic chronic kidney disease: Secondary | ICD-10-CM | POA: Diagnosis not present

## 2016-07-22 DIAGNOSIS — D631 Anemia in chronic kidney disease: Secondary | ICD-10-CM | POA: Diagnosis not present

## 2016-07-23 DIAGNOSIS — Z992 Dependence on renal dialysis: Secondary | ICD-10-CM | POA: Diagnosis not present

## 2016-07-23 DIAGNOSIS — E1122 Type 2 diabetes mellitus with diabetic chronic kidney disease: Secondary | ICD-10-CM | POA: Diagnosis not present

## 2016-07-23 DIAGNOSIS — Z01810 Encounter for preprocedural cardiovascular examination: Secondary | ICD-10-CM | POA: Diagnosis not present

## 2016-07-23 DIAGNOSIS — I120 Hypertensive chronic kidney disease with stage 5 chronic kidney disease or end stage renal disease: Secondary | ICD-10-CM | POA: Diagnosis not present

## 2016-07-23 DIAGNOSIS — I2510 Atherosclerotic heart disease of native coronary artery without angina pectoris: Secondary | ICD-10-CM | POA: Diagnosis not present

## 2016-07-23 DIAGNOSIS — N186 End stage renal disease: Secondary | ICD-10-CM | POA: Diagnosis not present

## 2016-07-23 DIAGNOSIS — Z955 Presence of coronary angioplasty implant and graft: Secondary | ICD-10-CM | POA: Diagnosis not present

## 2016-07-23 MED FILL — ASPIR-LOW EC 81 MG TABLET: 81 | 30 days supply | Qty: 30 | Fill #0

## 2016-07-23 MED FILL — ATORVASTATIN 40 MG TABLET: 40 | 30 days supply | Qty: 30 | Fill #0

## 2016-07-23 MED FILL — NITROGLYCERIN 0.4 MG TAB SL: 0.4 | 5 days supply | Qty: 25 | Fill #0

## 2016-07-23 MED FILL — BRILINTA 90 MG TABLET: 90 | 30 days supply | Qty: 60 | Fill #0

## 2016-07-24 DIAGNOSIS — E1129 Type 2 diabetes mellitus with other diabetic kidney complication: Secondary | ICD-10-CM | POA: Diagnosis not present

## 2016-07-24 DIAGNOSIS — D689 Coagulation defect, unspecified: Secondary | ICD-10-CM | POA: Diagnosis not present

## 2016-07-24 DIAGNOSIS — Z992 Dependence on renal dialysis: Secondary | ICD-10-CM | POA: Diagnosis not present

## 2016-07-24 DIAGNOSIS — N186 End stage renal disease: Secondary | ICD-10-CM | POA: Diagnosis not present

## 2016-07-24 DIAGNOSIS — D631 Anemia in chronic kidney disease: Secondary | ICD-10-CM | POA: Diagnosis not present

## 2016-07-24 DIAGNOSIS — N2581 Secondary hyperparathyroidism of renal origin: Secondary | ICD-10-CM | POA: Diagnosis not present

## 2016-07-26 DIAGNOSIS — N186 End stage renal disease: Secondary | ICD-10-CM | POA: Diagnosis not present

## 2016-07-26 DIAGNOSIS — D631 Anemia in chronic kidney disease: Secondary | ICD-10-CM | POA: Diagnosis not present

## 2016-07-26 DIAGNOSIS — N2581 Secondary hyperparathyroidism of renal origin: Secondary | ICD-10-CM | POA: Diagnosis not present

## 2016-07-26 DIAGNOSIS — D689 Coagulation defect, unspecified: Secondary | ICD-10-CM | POA: Diagnosis not present

## 2016-07-26 DIAGNOSIS — D509 Iron deficiency anemia, unspecified: Secondary | ICD-10-CM | POA: Diagnosis not present

## 2016-07-29 DIAGNOSIS — D631 Anemia in chronic kidney disease: Secondary | ICD-10-CM | POA: Diagnosis not present

## 2016-07-29 DIAGNOSIS — D509 Iron deficiency anemia, unspecified: Secondary | ICD-10-CM | POA: Diagnosis not present

## 2016-07-29 DIAGNOSIS — D689 Coagulation defect, unspecified: Secondary | ICD-10-CM | POA: Diagnosis not present

## 2016-07-29 DIAGNOSIS — N186 End stage renal disease: Secondary | ICD-10-CM | POA: Diagnosis not present

## 2016-07-29 DIAGNOSIS — N2581 Secondary hyperparathyroidism of renal origin: Secondary | ICD-10-CM | POA: Diagnosis not present

## 2016-07-31 DIAGNOSIS — N186 End stage renal disease: Secondary | ICD-10-CM | POA: Diagnosis not present

## 2016-07-31 DIAGNOSIS — D689 Coagulation defect, unspecified: Secondary | ICD-10-CM | POA: Diagnosis not present

## 2016-07-31 DIAGNOSIS — D509 Iron deficiency anemia, unspecified: Secondary | ICD-10-CM | POA: Diagnosis not present

## 2016-07-31 DIAGNOSIS — D631 Anemia in chronic kidney disease: Secondary | ICD-10-CM | POA: Diagnosis not present

## 2016-07-31 DIAGNOSIS — N2581 Secondary hyperparathyroidism of renal origin: Secondary | ICD-10-CM | POA: Diagnosis not present

## 2016-08-01 MED FILL — METOPROLOL SUCC ER 100 MG T: 100 | 30 days supply | Qty: 30 | Fill #0

## 2016-08-02 ENCOUNTER — Telehealth (HOSPITAL_COMMUNITY): Payer: Self-pay | Admitting: *Deleted

## 2016-08-02 DIAGNOSIS — E119 Type 2 diabetes mellitus without complications: Secondary | ICD-10-CM | POA: Diagnosis not present

## 2016-08-02 DIAGNOSIS — I10 Essential (primary) hypertension: Secondary | ICD-10-CM | POA: Diagnosis not present

## 2016-08-02 DIAGNOSIS — N186 End stage renal disease: Secondary | ICD-10-CM | POA: Diagnosis not present

## 2016-08-02 DIAGNOSIS — I2510 Atherosclerotic heart disease of native coronary artery without angina pectoris: Secondary | ICD-10-CM | POA: Diagnosis not present

## 2016-08-02 DIAGNOSIS — D509 Iron deficiency anemia, unspecified: Secondary | ICD-10-CM | POA: Diagnosis not present

## 2016-08-02 DIAGNOSIS — D631 Anemia in chronic kidney disease: Secondary | ICD-10-CM | POA: Diagnosis not present

## 2016-08-02 DIAGNOSIS — Z6832 Body mass index (BMI) 32.0-32.9, adult: Secondary | ICD-10-CM | POA: Diagnosis not present

## 2016-08-02 DIAGNOSIS — N185 Chronic kidney disease, stage 5: Secondary | ICD-10-CM | POA: Diagnosis not present

## 2016-08-02 DIAGNOSIS — N2581 Secondary hyperparathyroidism of renal origin: Secondary | ICD-10-CM | POA: Diagnosis not present

## 2016-08-02 DIAGNOSIS — I120 Hypertensive chronic kidney disease with stage 5 chronic kidney disease or end stage renal disease: Secondary | ICD-10-CM | POA: Diagnosis not present

## 2016-08-02 DIAGNOSIS — D689 Coagulation defect, unspecified: Secondary | ICD-10-CM | POA: Diagnosis not present

## 2016-08-02 NOTE — Telephone Encounter (Signed)
Verified [employer] insurance benefits through [employer] No Co-Pay, Co-Insurance 20% Deductible $250.00 pt has met all Out of Pocket $7350.00 pt has met $265.00, pt's balance $...7085.00 Reference [phone]... KJ

## 2016-08-05 DIAGNOSIS — D509 Iron deficiency anemia, unspecified: Secondary | ICD-10-CM | POA: Diagnosis not present

## 2016-08-05 DIAGNOSIS — N2581 Secondary hyperparathyroidism of renal origin: Secondary | ICD-10-CM | POA: Diagnosis not present

## 2016-08-05 DIAGNOSIS — D631 Anemia in chronic kidney disease: Secondary | ICD-10-CM | POA: Diagnosis not present

## 2016-08-05 DIAGNOSIS — N186 End stage renal disease: Secondary | ICD-10-CM | POA: Diagnosis not present

## 2016-08-05 DIAGNOSIS — D689 Coagulation defect, unspecified: Secondary | ICD-10-CM | POA: Diagnosis not present

## 2016-08-07 DIAGNOSIS — N186 End stage renal disease: Secondary | ICD-10-CM | POA: Diagnosis not present

## 2016-08-07 DIAGNOSIS — N2581 Secondary hyperparathyroidism of renal origin: Secondary | ICD-10-CM | POA: Diagnosis not present

## 2016-08-07 DIAGNOSIS — D509 Iron deficiency anemia, unspecified: Secondary | ICD-10-CM | POA: Diagnosis not present

## 2016-08-07 DIAGNOSIS — D689 Coagulation defect, unspecified: Secondary | ICD-10-CM | POA: Diagnosis not present

## 2016-08-07 DIAGNOSIS — D631 Anemia in chronic kidney disease: Secondary | ICD-10-CM | POA: Diagnosis not present

## 2016-08-09 DIAGNOSIS — N186 End stage renal disease: Secondary | ICD-10-CM | POA: Diagnosis not present

## 2016-08-09 DIAGNOSIS — D689 Coagulation defect, unspecified: Secondary | ICD-10-CM | POA: Diagnosis not present

## 2016-08-09 DIAGNOSIS — N2581 Secondary hyperparathyroidism of renal origin: Secondary | ICD-10-CM | POA: Diagnosis not present

## 2016-08-09 DIAGNOSIS — D509 Iron deficiency anemia, unspecified: Secondary | ICD-10-CM | POA: Diagnosis not present

## 2016-08-09 DIAGNOSIS — D631 Anemia in chronic kidney disease: Secondary | ICD-10-CM | POA: Diagnosis not present

## 2016-08-12 DIAGNOSIS — D689 Coagulation defect, unspecified: Secondary | ICD-10-CM | POA: Diagnosis not present

## 2016-08-12 DIAGNOSIS — D509 Iron deficiency anemia, unspecified: Secondary | ICD-10-CM | POA: Diagnosis not present

## 2016-08-12 DIAGNOSIS — N2581 Secondary hyperparathyroidism of renal origin: Secondary | ICD-10-CM | POA: Diagnosis not present

## 2016-08-12 DIAGNOSIS — D631 Anemia in chronic kidney disease: Secondary | ICD-10-CM | POA: Diagnosis not present

## 2016-08-12 DIAGNOSIS — N186 End stage renal disease: Secondary | ICD-10-CM | POA: Diagnosis not present

## 2016-08-14 ENCOUNTER — Telehealth (HOSPITAL_COMMUNITY): Payer: Self-pay | Admitting: *Deleted

## 2016-08-14 DIAGNOSIS — D509 Iron deficiency anemia, unspecified: Secondary | ICD-10-CM | POA: Diagnosis not present

## 2016-08-14 DIAGNOSIS — N2581 Secondary hyperparathyroidism of renal origin: Secondary | ICD-10-CM | POA: Diagnosis not present

## 2016-08-14 DIAGNOSIS — D689 Coagulation defect, unspecified: Secondary | ICD-10-CM | POA: Diagnosis not present

## 2016-08-14 DIAGNOSIS — D631 Anemia in chronic kidney disease: Secondary | ICD-10-CM | POA: Diagnosis not present

## 2016-08-14 DIAGNOSIS — N186 End stage renal disease: Secondary | ICD-10-CM | POA: Diagnosis not present

## 2016-08-16 DIAGNOSIS — N2581 Secondary hyperparathyroidism of renal origin: Secondary | ICD-10-CM | POA: Diagnosis not present

## 2016-08-16 DIAGNOSIS — N186 End stage renal disease: Secondary | ICD-10-CM | POA: Diagnosis not present

## 2016-08-16 DIAGNOSIS — D631 Anemia in chronic kidney disease: Secondary | ICD-10-CM | POA: Diagnosis not present

## 2016-08-16 DIAGNOSIS — D509 Iron deficiency anemia, unspecified: Secondary | ICD-10-CM | POA: Diagnosis not present

## 2016-08-16 DIAGNOSIS — D689 Coagulation defect, unspecified: Secondary | ICD-10-CM | POA: Diagnosis not present

## 2016-08-19 DIAGNOSIS — D509 Iron deficiency anemia, unspecified: Secondary | ICD-10-CM | POA: Diagnosis not present

## 2016-08-19 DIAGNOSIS — D689 Coagulation defect, unspecified: Secondary | ICD-10-CM | POA: Diagnosis not present

## 2016-08-19 DIAGNOSIS — N2581 Secondary hyperparathyroidism of renal origin: Secondary | ICD-10-CM | POA: Diagnosis not present

## 2016-08-19 DIAGNOSIS — N186 End stage renal disease: Secondary | ICD-10-CM | POA: Diagnosis not present

## 2016-08-19 DIAGNOSIS — E1129 Type 2 diabetes mellitus with other diabetic kidney complication: Secondary | ICD-10-CM | POA: Diagnosis not present

## 2016-08-19 DIAGNOSIS — D631 Anemia in chronic kidney disease: Secondary | ICD-10-CM | POA: Diagnosis not present

## 2016-08-21 DIAGNOSIS — D631 Anemia in chronic kidney disease: Secondary | ICD-10-CM | POA: Diagnosis not present

## 2016-08-21 DIAGNOSIS — N186 End stage renal disease: Secondary | ICD-10-CM | POA: Diagnosis not present

## 2016-08-21 DIAGNOSIS — D509 Iron deficiency anemia, unspecified: Secondary | ICD-10-CM | POA: Diagnosis not present

## 2016-08-21 DIAGNOSIS — D689 Coagulation defect, unspecified: Secondary | ICD-10-CM | POA: Diagnosis not present

## 2016-08-21 DIAGNOSIS — E1129 Type 2 diabetes mellitus with other diabetic kidney complication: Secondary | ICD-10-CM | POA: Diagnosis not present

## 2016-08-21 DIAGNOSIS — N2581 Secondary hyperparathyroidism of renal origin: Secondary | ICD-10-CM | POA: Diagnosis not present

## 2016-08-21 MED FILL — glyBURIDE 5 MG TABS: 5 | 30 days supply | Qty: 60 | Fill #2

## 2016-08-21 MED FILL — BRILINTA 90 MG TABLET: 90 | 30 days supply | Qty: 60 | Fill #1

## 2016-08-21 MED FILL — ASPIR-LOW 81 MG TABLET EC: 81 | 90 days supply | Qty: 90 | Fill #1

## 2016-08-22 MED FILL — ATORVASTATIN 40 MG TABLET: 40 | 30 days supply | Qty: 30 | Fill #1

## 2016-08-23 DIAGNOSIS — E1129 Type 2 diabetes mellitus with other diabetic kidney complication: Secondary | ICD-10-CM | POA: Diagnosis not present

## 2016-08-23 DIAGNOSIS — N186 End stage renal disease: Secondary | ICD-10-CM | POA: Diagnosis not present

## 2016-08-23 DIAGNOSIS — N2581 Secondary hyperparathyroidism of renal origin: Secondary | ICD-10-CM | POA: Diagnosis not present

## 2016-08-23 DIAGNOSIS — D689 Coagulation defect, unspecified: Secondary | ICD-10-CM | POA: Diagnosis not present

## 2016-08-23 DIAGNOSIS — D509 Iron deficiency anemia, unspecified: Secondary | ICD-10-CM | POA: Diagnosis not present

## 2016-08-23 DIAGNOSIS — D631 Anemia in chronic kidney disease: Secondary | ICD-10-CM | POA: Diagnosis not present

## 2016-08-24 DIAGNOSIS — Z992 Dependence on renal dialysis: Secondary | ICD-10-CM | POA: Diagnosis not present

## 2016-08-24 DIAGNOSIS — N186 End stage renal disease: Secondary | ICD-10-CM | POA: Diagnosis not present

## 2016-08-24 DIAGNOSIS — E1129 Type 2 diabetes mellitus with other diabetic kidney complication: Secondary | ICD-10-CM | POA: Diagnosis not present

## 2016-08-26 DIAGNOSIS — N2581 Secondary hyperparathyroidism of renal origin: Secondary | ICD-10-CM | POA: Diagnosis not present

## 2016-08-26 DIAGNOSIS — N186 End stage renal disease: Secondary | ICD-10-CM | POA: Diagnosis not present

## 2016-08-26 DIAGNOSIS — D509 Iron deficiency anemia, unspecified: Secondary | ICD-10-CM | POA: Diagnosis not present

## 2016-08-26 DIAGNOSIS — D689 Coagulation defect, unspecified: Secondary | ICD-10-CM | POA: Diagnosis not present

## 2016-08-26 DIAGNOSIS — D631 Anemia in chronic kidney disease: Secondary | ICD-10-CM | POA: Diagnosis not present

## 2016-08-28 DIAGNOSIS — N2581 Secondary hyperparathyroidism of renal origin: Secondary | ICD-10-CM | POA: Diagnosis not present

## 2016-08-28 DIAGNOSIS — D689 Coagulation defect, unspecified: Secondary | ICD-10-CM | POA: Diagnosis not present

## 2016-08-28 DIAGNOSIS — N186 End stage renal disease: Secondary | ICD-10-CM | POA: Diagnosis not present

## 2016-08-28 DIAGNOSIS — D509 Iron deficiency anemia, unspecified: Secondary | ICD-10-CM | POA: Diagnosis not present

## 2016-08-28 DIAGNOSIS — D631 Anemia in chronic kidney disease: Secondary | ICD-10-CM | POA: Diagnosis not present

## 2016-08-28 MED FILL — LIDOCAINE-PRILOCAINE CREAM: 2.5-2.5 | 10 days supply | Qty: 30 | Fill #4

## 2016-08-30 DIAGNOSIS — N186 End stage renal disease: Secondary | ICD-10-CM | POA: Diagnosis not present

## 2016-08-30 DIAGNOSIS — D509 Iron deficiency anemia, unspecified: Secondary | ICD-10-CM | POA: Diagnosis not present

## 2016-08-30 DIAGNOSIS — D631 Anemia in chronic kidney disease: Secondary | ICD-10-CM | POA: Diagnosis not present

## 2016-08-30 DIAGNOSIS — D689 Coagulation defect, unspecified: Secondary | ICD-10-CM | POA: Diagnosis not present

## 2016-08-30 DIAGNOSIS — N2581 Secondary hyperparathyroidism of renal origin: Secondary | ICD-10-CM | POA: Diagnosis not present

## 2016-08-30 MED FILL — METOPROLOL SUCC ER 100 MG T: 100 | 30 days supply | Qty: 30 | Fill #1

## 2016-09-02 DIAGNOSIS — D631 Anemia in chronic kidney disease: Secondary | ICD-10-CM | POA: Diagnosis not present

## 2016-09-02 DIAGNOSIS — N186 End stage renal disease: Secondary | ICD-10-CM | POA: Diagnosis not present

## 2016-09-02 DIAGNOSIS — D509 Iron deficiency anemia, unspecified: Secondary | ICD-10-CM | POA: Diagnosis not present

## 2016-09-02 DIAGNOSIS — N2581 Secondary hyperparathyroidism of renal origin: Secondary | ICD-10-CM | POA: Diagnosis not present

## 2016-09-02 DIAGNOSIS — D689 Coagulation defect, unspecified: Secondary | ICD-10-CM | POA: Diagnosis not present

## 2016-09-04 DIAGNOSIS — D689 Coagulation defect, unspecified: Secondary | ICD-10-CM | POA: Diagnosis not present

## 2016-09-04 DIAGNOSIS — N186 End stage renal disease: Secondary | ICD-10-CM | POA: Diagnosis not present

## 2016-09-04 DIAGNOSIS — D509 Iron deficiency anemia, unspecified: Secondary | ICD-10-CM | POA: Diagnosis not present

## 2016-09-04 DIAGNOSIS — D631 Anemia in chronic kidney disease: Secondary | ICD-10-CM | POA: Diagnosis not present

## 2016-09-04 DIAGNOSIS — N2581 Secondary hyperparathyroidism of renal origin: Secondary | ICD-10-CM | POA: Diagnosis not present

## 2016-09-05 DIAGNOSIS — I2510 Atherosclerotic heart disease of native coronary artery without angina pectoris: Secondary | ICD-10-CM | POA: Diagnosis not present

## 2016-09-05 DIAGNOSIS — Z7901 Long term (current) use of anticoagulants: Secondary | ICD-10-CM | POA: Diagnosis not present

## 2016-09-05 DIAGNOSIS — Z7982 Long term (current) use of aspirin: Secondary | ICD-10-CM | POA: Diagnosis not present

## 2016-09-06 DIAGNOSIS — D631 Anemia in chronic kidney disease: Secondary | ICD-10-CM | POA: Diagnosis not present

## 2016-09-06 DIAGNOSIS — D689 Coagulation defect, unspecified: Secondary | ICD-10-CM | POA: Diagnosis not present

## 2016-09-06 DIAGNOSIS — N2581 Secondary hyperparathyroidism of renal origin: Secondary | ICD-10-CM | POA: Diagnosis not present

## 2016-09-06 DIAGNOSIS — D509 Iron deficiency anemia, unspecified: Secondary | ICD-10-CM | POA: Diagnosis not present

## 2016-09-06 DIAGNOSIS — N186 End stage renal disease: Secondary | ICD-10-CM | POA: Diagnosis not present

## 2016-09-09 DIAGNOSIS — N2581 Secondary hyperparathyroidism of renal origin: Secondary | ICD-10-CM | POA: Diagnosis not present

## 2016-09-09 DIAGNOSIS — D631 Anemia in chronic kidney disease: Secondary | ICD-10-CM | POA: Diagnosis not present

## 2016-09-09 DIAGNOSIS — D509 Iron deficiency anemia, unspecified: Secondary | ICD-10-CM | POA: Diagnosis not present

## 2016-09-09 DIAGNOSIS — N186 End stage renal disease: Secondary | ICD-10-CM | POA: Diagnosis not present

## 2016-09-09 DIAGNOSIS — D689 Coagulation defect, unspecified: Secondary | ICD-10-CM | POA: Diagnosis not present

## 2016-09-11 DIAGNOSIS — D689 Coagulation defect, unspecified: Secondary | ICD-10-CM | POA: Diagnosis not present

## 2016-09-11 DIAGNOSIS — N2581 Secondary hyperparathyroidism of renal origin: Secondary | ICD-10-CM | POA: Diagnosis not present

## 2016-09-11 DIAGNOSIS — D509 Iron deficiency anemia, unspecified: Secondary | ICD-10-CM | POA: Diagnosis not present

## 2016-09-11 DIAGNOSIS — N186 End stage renal disease: Secondary | ICD-10-CM | POA: Diagnosis not present

## 2016-09-11 DIAGNOSIS — D631 Anemia in chronic kidney disease: Secondary | ICD-10-CM | POA: Diagnosis not present

## 2016-09-13 DIAGNOSIS — D631 Anemia in chronic kidney disease: Secondary | ICD-10-CM | POA: Diagnosis not present

## 2016-09-13 DIAGNOSIS — N186 End stage renal disease: Secondary | ICD-10-CM | POA: Diagnosis not present

## 2016-09-13 DIAGNOSIS — D509 Iron deficiency anemia, unspecified: Secondary | ICD-10-CM | POA: Diagnosis not present

## 2016-09-13 DIAGNOSIS — D689 Coagulation defect, unspecified: Secondary | ICD-10-CM | POA: Diagnosis not present

## 2016-09-13 DIAGNOSIS — N2581 Secondary hyperparathyroidism of renal origin: Secondary | ICD-10-CM | POA: Diagnosis not present

## 2016-09-16 DIAGNOSIS — D689 Coagulation defect, unspecified: Secondary | ICD-10-CM | POA: Diagnosis not present

## 2016-09-16 DIAGNOSIS — D509 Iron deficiency anemia, unspecified: Secondary | ICD-10-CM | POA: Diagnosis not present

## 2016-09-16 DIAGNOSIS — E1129 Type 2 diabetes mellitus with other diabetic kidney complication: Secondary | ICD-10-CM | POA: Diagnosis not present

## 2016-09-16 DIAGNOSIS — N186 End stage renal disease: Secondary | ICD-10-CM | POA: Diagnosis not present

## 2016-09-16 DIAGNOSIS — D631 Anemia in chronic kidney disease: Secondary | ICD-10-CM | POA: Diagnosis not present

## 2016-09-16 DIAGNOSIS — N2581 Secondary hyperparathyroidism of renal origin: Secondary | ICD-10-CM | POA: Diagnosis not present

## 2016-09-16 MED FILL — ATORVASTATIN 40 MG TABLET: 40 | 90 days supply | Qty: 90 | Fill #2

## 2016-09-16 MED FILL — BRILINTA 90 MG TABLET: 90 | 30 days supply | Qty: 60 | Fill #2

## 2016-09-17 DIAGNOSIS — I25118 Atherosclerotic heart disease of native coronary artery with other forms of angina pectoris: Secondary | ICD-10-CM | POA: Diagnosis not present

## 2016-09-17 DIAGNOSIS — Z803 Family history of malignant neoplasm of breast: Secondary | ICD-10-CM | POA: Diagnosis not present

## 2016-09-17 DIAGNOSIS — E1129 Type 2 diabetes mellitus with other diabetic kidney complication: Secondary | ICD-10-CM | POA: Diagnosis not present

## 2016-09-17 DIAGNOSIS — D631 Anemia in chronic kidney disease: Secondary | ICD-10-CM | POA: Diagnosis not present

## 2016-09-17 DIAGNOSIS — C541 Malignant neoplasm of endometrium: Secondary | ICD-10-CM | POA: Diagnosis not present

## 2016-09-17 DIAGNOSIS — N186 End stage renal disease: Secondary | ICD-10-CM | POA: Diagnosis not present

## 2016-09-17 DIAGNOSIS — Z0000 Encounter for general adult medical examination without abnormal findings: Secondary | ICD-10-CM | POA: Diagnosis not present

## 2016-09-17 DIAGNOSIS — Z1389 Encounter for screening for other disorder: Secondary | ICD-10-CM | POA: Diagnosis not present

## 2016-09-17 DIAGNOSIS — I10 Essential (primary) hypertension: Secondary | ICD-10-CM | POA: Diagnosis not present

## 2016-09-17 DIAGNOSIS — I120 Hypertensive chronic kidney disease with stage 5 chronic kidney disease or end stage renal disease: Secondary | ICD-10-CM | POA: Diagnosis not present

## 2016-09-17 MED FILL — AMLODIPINE BESYLATE 10 MG T: 10 | 90 days supply | Qty: 90 | Fill #0 | Status: TO

## 2016-09-18 DIAGNOSIS — D689 Coagulation defect, unspecified: Secondary | ICD-10-CM | POA: Diagnosis not present

## 2016-09-18 DIAGNOSIS — E1129 Type 2 diabetes mellitus with other diabetic kidney complication: Secondary | ICD-10-CM | POA: Diagnosis not present

## 2016-09-18 DIAGNOSIS — N2581 Secondary hyperparathyroidism of renal origin: Secondary | ICD-10-CM | POA: Diagnosis not present

## 2016-09-18 DIAGNOSIS — D509 Iron deficiency anemia, unspecified: Secondary | ICD-10-CM | POA: Diagnosis not present

## 2016-09-18 DIAGNOSIS — D631 Anemia in chronic kidney disease: Secondary | ICD-10-CM | POA: Diagnosis not present

## 2016-09-18 DIAGNOSIS — N186 End stage renal disease: Secondary | ICD-10-CM | POA: Diagnosis not present

## 2016-09-20 DIAGNOSIS — D631 Anemia in chronic kidney disease: Secondary | ICD-10-CM | POA: Diagnosis not present

## 2016-09-20 DIAGNOSIS — N186 End stage renal disease: Secondary | ICD-10-CM | POA: Diagnosis not present

## 2016-09-20 DIAGNOSIS — D509 Iron deficiency anemia, unspecified: Secondary | ICD-10-CM | POA: Diagnosis not present

## 2016-09-20 DIAGNOSIS — D689 Coagulation defect, unspecified: Secondary | ICD-10-CM | POA: Diagnosis not present

## 2016-09-20 DIAGNOSIS — E1129 Type 2 diabetes mellitus with other diabetic kidney complication: Secondary | ICD-10-CM | POA: Diagnosis not present

## 2016-09-20 DIAGNOSIS — N2581 Secondary hyperparathyroidism of renal origin: Secondary | ICD-10-CM | POA: Diagnosis not present

## 2016-09-23 DIAGNOSIS — N2581 Secondary hyperparathyroidism of renal origin: Secondary | ICD-10-CM | POA: Diagnosis not present

## 2016-09-23 DIAGNOSIS — D689 Coagulation defect, unspecified: Secondary | ICD-10-CM | POA: Diagnosis not present

## 2016-09-23 DIAGNOSIS — Z992 Dependence on renal dialysis: Secondary | ICD-10-CM | POA: Diagnosis not present

## 2016-09-23 DIAGNOSIS — E1129 Type 2 diabetes mellitus with other diabetic kidney complication: Secondary | ICD-10-CM | POA: Diagnosis not present

## 2016-09-23 DIAGNOSIS — N186 End stage renal disease: Secondary | ICD-10-CM | POA: Diagnosis not present

## 2016-09-25 DIAGNOSIS — D689 Coagulation defect, unspecified: Secondary | ICD-10-CM | POA: Diagnosis not present

## 2016-09-25 DIAGNOSIS — N186 End stage renal disease: Secondary | ICD-10-CM | POA: Diagnosis not present

## 2016-09-25 DIAGNOSIS — N2581 Secondary hyperparathyroidism of renal origin: Secondary | ICD-10-CM | POA: Diagnosis not present

## 2016-09-25 DIAGNOSIS — D631 Anemia in chronic kidney disease: Secondary | ICD-10-CM | POA: Diagnosis not present

## 2016-09-27 DIAGNOSIS — D631 Anemia in chronic kidney disease: Secondary | ICD-10-CM | POA: Diagnosis not present

## 2016-09-27 DIAGNOSIS — N2581 Secondary hyperparathyroidism of renal origin: Secondary | ICD-10-CM | POA: Diagnosis not present

## 2016-09-27 DIAGNOSIS — N186 End stage renal disease: Secondary | ICD-10-CM | POA: Diagnosis not present

## 2016-09-27 DIAGNOSIS — D689 Coagulation defect, unspecified: Secondary | ICD-10-CM | POA: Diagnosis not present

## 2016-09-30 DIAGNOSIS — N2581 Secondary hyperparathyroidism of renal origin: Secondary | ICD-10-CM | POA: Diagnosis not present

## 2016-09-30 DIAGNOSIS — D689 Coagulation defect, unspecified: Secondary | ICD-10-CM | POA: Diagnosis not present

## 2016-09-30 DIAGNOSIS — N186 End stage renal disease: Secondary | ICD-10-CM | POA: Diagnosis not present

## 2016-10-02 DIAGNOSIS — N186 End stage renal disease: Secondary | ICD-10-CM | POA: Diagnosis not present

## 2016-10-02 DIAGNOSIS — D689 Coagulation defect, unspecified: Secondary | ICD-10-CM | POA: Diagnosis not present

## 2016-10-02 DIAGNOSIS — N2581 Secondary hyperparathyroidism of renal origin: Secondary | ICD-10-CM | POA: Diagnosis not present

## 2016-10-02 MED FILL — LIDOCAINE-PRILOCAINE CREAM: 2.5-2.5 | 10 days supply | Qty: 30 | Fill #5

## 2016-10-02 MED FILL — METOPROLOL SUCC ER 100 MG T: 100 | 30 days supply | Qty: 30 | Fill #2

## 2016-10-04 DIAGNOSIS — N186 End stage renal disease: Secondary | ICD-10-CM | POA: Diagnosis not present

## 2016-10-04 DIAGNOSIS — D689 Coagulation defect, unspecified: Secondary | ICD-10-CM | POA: Diagnosis not present

## 2016-10-04 DIAGNOSIS — N2581 Secondary hyperparathyroidism of renal origin: Secondary | ICD-10-CM | POA: Diagnosis not present

## 2016-10-07 DIAGNOSIS — D689 Coagulation defect, unspecified: Secondary | ICD-10-CM | POA: Diagnosis not present

## 2016-10-07 DIAGNOSIS — N2581 Secondary hyperparathyroidism of renal origin: Secondary | ICD-10-CM | POA: Diagnosis not present

## 2016-10-07 DIAGNOSIS — N186 End stage renal disease: Secondary | ICD-10-CM | POA: Diagnosis not present

## 2016-10-09 DIAGNOSIS — N186 End stage renal disease: Secondary | ICD-10-CM | POA: Diagnosis not present

## 2016-10-09 DIAGNOSIS — D689 Coagulation defect, unspecified: Secondary | ICD-10-CM | POA: Diagnosis not present

## 2016-10-09 DIAGNOSIS — N2581 Secondary hyperparathyroidism of renal origin: Secondary | ICD-10-CM | POA: Diagnosis not present

## 2016-10-11 DIAGNOSIS — N2581 Secondary hyperparathyroidism of renal origin: Secondary | ICD-10-CM | POA: Diagnosis not present

## 2016-10-11 DIAGNOSIS — D689 Coagulation defect, unspecified: Secondary | ICD-10-CM | POA: Diagnosis not present

## 2016-10-11 DIAGNOSIS — N186 End stage renal disease: Secondary | ICD-10-CM | POA: Diagnosis not present

## 2016-10-14 DIAGNOSIS — N2581 Secondary hyperparathyroidism of renal origin: Secondary | ICD-10-CM | POA: Diagnosis not present

## 2016-10-14 DIAGNOSIS — D689 Coagulation defect, unspecified: Secondary | ICD-10-CM | POA: Diagnosis not present

## 2016-10-14 DIAGNOSIS — N186 End stage renal disease: Secondary | ICD-10-CM | POA: Diagnosis not present

## 2016-10-16 DIAGNOSIS — N2581 Secondary hyperparathyroidism of renal origin: Secondary | ICD-10-CM | POA: Diagnosis not present

## 2016-10-16 DIAGNOSIS — D689 Coagulation defect, unspecified: Secondary | ICD-10-CM | POA: Diagnosis not present

## 2016-10-16 DIAGNOSIS — N186 End stage renal disease: Secondary | ICD-10-CM | POA: Diagnosis not present

## 2016-10-18 DIAGNOSIS — D689 Coagulation defect, unspecified: Secondary | ICD-10-CM | POA: Diagnosis not present

## 2016-10-18 DIAGNOSIS — N186 End stage renal disease: Secondary | ICD-10-CM | POA: Diagnosis not present

## 2016-10-18 DIAGNOSIS — N2581 Secondary hyperparathyroidism of renal origin: Secondary | ICD-10-CM | POA: Diagnosis not present

## 2016-10-21 DIAGNOSIS — D689 Coagulation defect, unspecified: Secondary | ICD-10-CM | POA: Diagnosis not present

## 2016-10-21 DIAGNOSIS — N186 End stage renal disease: Secondary | ICD-10-CM | POA: Diagnosis not present

## 2016-10-21 DIAGNOSIS — N2581 Secondary hyperparathyroidism of renal origin: Secondary | ICD-10-CM | POA: Diagnosis not present

## 2016-10-22 MED FILL — BRILINTA 90 MG TABLET: 90 | 30 days supply | Qty: 60 | Fill #3

## 2016-10-23 DIAGNOSIS — D689 Coagulation defect, unspecified: Secondary | ICD-10-CM | POA: Diagnosis not present

## 2016-10-23 DIAGNOSIS — N2581 Secondary hyperparathyroidism of renal origin: Secondary | ICD-10-CM | POA: Diagnosis not present

## 2016-10-23 DIAGNOSIS — N186 End stage renal disease: Secondary | ICD-10-CM | POA: Diagnosis not present

## 2016-10-24 DIAGNOSIS — Z992 Dependence on renal dialysis: Secondary | ICD-10-CM | POA: Diagnosis not present

## 2016-10-24 DIAGNOSIS — E1129 Type 2 diabetes mellitus with other diabetic kidney complication: Secondary | ICD-10-CM | POA: Diagnosis not present

## 2016-10-24 DIAGNOSIS — N186 End stage renal disease: Secondary | ICD-10-CM | POA: Diagnosis not present

## 2016-10-25 DIAGNOSIS — N186 End stage renal disease: Secondary | ICD-10-CM | POA: Diagnosis not present

## 2016-10-25 DIAGNOSIS — D689 Coagulation defect, unspecified: Secondary | ICD-10-CM | POA: Diagnosis not present

## 2016-10-25 DIAGNOSIS — N2581 Secondary hyperparathyroidism of renal origin: Secondary | ICD-10-CM | POA: Diagnosis not present

## 2016-10-28 DIAGNOSIS — N186 End stage renal disease: Secondary | ICD-10-CM | POA: Diagnosis not present

## 2016-10-28 DIAGNOSIS — N2581 Secondary hyperparathyroidism of renal origin: Secondary | ICD-10-CM | POA: Diagnosis not present

## 2016-10-28 DIAGNOSIS — D689 Coagulation defect, unspecified: Secondary | ICD-10-CM | POA: Diagnosis not present

## 2016-10-28 MED FILL — CALCIUM ACETATE (PHOS BINDE: 667 | 90 days supply | Qty: 720 | Fill #1 | Status: TO

## 2016-10-30 DIAGNOSIS — D689 Coagulation defect, unspecified: Secondary | ICD-10-CM | POA: Diagnosis not present

## 2016-10-30 DIAGNOSIS — N2581 Secondary hyperparathyroidism of renal origin: Secondary | ICD-10-CM | POA: Diagnosis not present

## 2016-10-30 DIAGNOSIS — N186 End stage renal disease: Secondary | ICD-10-CM | POA: Diagnosis not present

## 2016-11-01 DIAGNOSIS — N2581 Secondary hyperparathyroidism of renal origin: Secondary | ICD-10-CM | POA: Diagnosis not present

## 2016-11-01 DIAGNOSIS — N186 End stage renal disease: Secondary | ICD-10-CM | POA: Diagnosis not present

## 2016-11-01 DIAGNOSIS — D689 Coagulation defect, unspecified: Secondary | ICD-10-CM | POA: Diagnosis not present

## 2016-11-01 MED FILL — METOPROLOL SUCC ER 100 MG T: 100 | 30 days supply | Qty: 30 | Fill #3

## 2016-11-01 MED FILL — glyBURIDE 5 MG TABS: 5 | 30 days supply | Qty: 60 | Fill #3

## 2016-11-04 DIAGNOSIS — N186 End stage renal disease: Secondary | ICD-10-CM | POA: Diagnosis not present

## 2016-11-04 DIAGNOSIS — D689 Coagulation defect, unspecified: Secondary | ICD-10-CM | POA: Diagnosis not present

## 2016-11-04 DIAGNOSIS — N2581 Secondary hyperparathyroidism of renal origin: Secondary | ICD-10-CM | POA: Diagnosis not present

## 2016-11-06 DIAGNOSIS — D689 Coagulation defect, unspecified: Secondary | ICD-10-CM | POA: Diagnosis not present

## 2016-11-06 DIAGNOSIS — N186 End stage renal disease: Secondary | ICD-10-CM | POA: Diagnosis not present

## 2016-11-06 DIAGNOSIS — N2581 Secondary hyperparathyroidism of renal origin: Secondary | ICD-10-CM | POA: Diagnosis not present

## 2016-11-08 DIAGNOSIS — D689 Coagulation defect, unspecified: Secondary | ICD-10-CM | POA: Diagnosis not present

## 2016-11-08 DIAGNOSIS — N2581 Secondary hyperparathyroidism of renal origin: Secondary | ICD-10-CM | POA: Diagnosis not present

## 2016-11-08 DIAGNOSIS — N186 End stage renal disease: Secondary | ICD-10-CM | POA: Diagnosis not present

## 2016-11-11 DIAGNOSIS — N186 End stage renal disease: Secondary | ICD-10-CM | POA: Diagnosis not present

## 2016-11-11 DIAGNOSIS — N2581 Secondary hyperparathyroidism of renal origin: Secondary | ICD-10-CM | POA: Diagnosis not present

## 2016-11-11 DIAGNOSIS — D689 Coagulation defect, unspecified: Secondary | ICD-10-CM | POA: Diagnosis not present

## 2016-11-13 DIAGNOSIS — D689 Coagulation defect, unspecified: Secondary | ICD-10-CM | POA: Diagnosis not present

## 2016-11-13 DIAGNOSIS — N186 End stage renal disease: Secondary | ICD-10-CM | POA: Diagnosis not present

## 2016-11-13 DIAGNOSIS — N2581 Secondary hyperparathyroidism of renal origin: Secondary | ICD-10-CM | POA: Diagnosis not present

## 2016-11-15 DIAGNOSIS — D689 Coagulation defect, unspecified: Secondary | ICD-10-CM | POA: Diagnosis not present

## 2016-11-15 DIAGNOSIS — N186 End stage renal disease: Secondary | ICD-10-CM | POA: Diagnosis not present

## 2016-11-15 DIAGNOSIS — N2581 Secondary hyperparathyroidism of renal origin: Secondary | ICD-10-CM | POA: Diagnosis not present

## 2016-11-18 DIAGNOSIS — E1129 Type 2 diabetes mellitus with other diabetic kidney complication: Secondary | ICD-10-CM | POA: Diagnosis not present

## 2016-11-18 DIAGNOSIS — D689 Coagulation defect, unspecified: Secondary | ICD-10-CM | POA: Diagnosis not present

## 2016-11-18 DIAGNOSIS — N186 End stage renal disease: Secondary | ICD-10-CM | POA: Diagnosis not present

## 2016-11-18 DIAGNOSIS — D631 Anemia in chronic kidney disease: Secondary | ICD-10-CM | POA: Diagnosis not present

## 2016-11-18 DIAGNOSIS — N2581 Secondary hyperparathyroidism of renal origin: Secondary | ICD-10-CM | POA: Diagnosis not present

## 2016-11-20 DIAGNOSIS — N2581 Secondary hyperparathyroidism of renal origin: Secondary | ICD-10-CM | POA: Diagnosis not present

## 2016-11-20 DIAGNOSIS — N186 End stage renal disease: Secondary | ICD-10-CM | POA: Diagnosis not present

## 2016-11-20 DIAGNOSIS — E1129 Type 2 diabetes mellitus with other diabetic kidney complication: Secondary | ICD-10-CM | POA: Diagnosis not present

## 2016-11-20 DIAGNOSIS — D689 Coagulation defect, unspecified: Secondary | ICD-10-CM | POA: Diagnosis not present

## 2016-11-20 DIAGNOSIS — D631 Anemia in chronic kidney disease: Secondary | ICD-10-CM | POA: Diagnosis not present

## 2016-11-21 MED FILL — LIDOCAINE-PRILOCAINE CREAM: 2.5-2.5 | 10 days supply | Qty: 30 | Fill #6

## 2016-11-21 MED FILL — ASPIRIN EC 81 MG TABLET: 81 | 90 days supply | Qty: 90 | Fill #2

## 2016-11-21 MED FILL — BRILINTA 90 MG TABLET: 90 | 30 days supply | Qty: 60 | Fill #4

## 2016-11-22 DIAGNOSIS — D631 Anemia in chronic kidney disease: Secondary | ICD-10-CM | POA: Diagnosis not present

## 2016-11-22 DIAGNOSIS — N2581 Secondary hyperparathyroidism of renal origin: Secondary | ICD-10-CM | POA: Diagnosis not present

## 2016-11-22 DIAGNOSIS — E1129 Type 2 diabetes mellitus with other diabetic kidney complication: Secondary | ICD-10-CM | POA: Diagnosis not present

## 2016-11-22 DIAGNOSIS — D689 Coagulation defect, unspecified: Secondary | ICD-10-CM | POA: Diagnosis not present

## 2016-11-22 DIAGNOSIS — N186 End stage renal disease: Secondary | ICD-10-CM | POA: Diagnosis not present

## 2016-11-23 DIAGNOSIS — N186 End stage renal disease: Secondary | ICD-10-CM | POA: Diagnosis not present

## 2016-11-23 DIAGNOSIS — E1129 Type 2 diabetes mellitus with other diabetic kidney complication: Secondary | ICD-10-CM | POA: Diagnosis not present

## 2016-11-23 DIAGNOSIS — Z992 Dependence on renal dialysis: Secondary | ICD-10-CM | POA: Diagnosis not present

## 2016-11-25 DIAGNOSIS — D689 Coagulation defect, unspecified: Secondary | ICD-10-CM | POA: Diagnosis not present

## 2016-11-25 DIAGNOSIS — N2581 Secondary hyperparathyroidism of renal origin: Secondary | ICD-10-CM | POA: Diagnosis not present

## 2016-11-25 DIAGNOSIS — N186 End stage renal disease: Secondary | ICD-10-CM | POA: Diagnosis not present

## 2016-11-27 DIAGNOSIS — N186 End stage renal disease: Secondary | ICD-10-CM | POA: Diagnosis not present

## 2016-11-27 DIAGNOSIS — N2581 Secondary hyperparathyroidism of renal origin: Secondary | ICD-10-CM | POA: Diagnosis not present

## 2016-11-27 DIAGNOSIS — D689 Coagulation defect, unspecified: Secondary | ICD-10-CM | POA: Diagnosis not present

## 2016-11-29 DIAGNOSIS — N2581 Secondary hyperparathyroidism of renal origin: Secondary | ICD-10-CM | POA: Diagnosis not present

## 2016-11-29 DIAGNOSIS — D689 Coagulation defect, unspecified: Secondary | ICD-10-CM | POA: Diagnosis not present

## 2016-11-29 DIAGNOSIS — N186 End stage renal disease: Secondary | ICD-10-CM | POA: Diagnosis not present

## 2016-12-02 DIAGNOSIS — N186 End stage renal disease: Secondary | ICD-10-CM | POA: Diagnosis not present

## 2016-12-02 DIAGNOSIS — D689 Coagulation defect, unspecified: Secondary | ICD-10-CM | POA: Diagnosis not present

## 2016-12-02 DIAGNOSIS — D631 Anemia in chronic kidney disease: Secondary | ICD-10-CM | POA: Diagnosis not present

## 2016-12-02 DIAGNOSIS — N2581 Secondary hyperparathyroidism of renal origin: Secondary | ICD-10-CM | POA: Diagnosis not present

## 2016-12-02 MED FILL — METOPROLOL SUCC ER 100 MG T: 100 | 30 days supply | Qty: 30 | Fill #4

## 2016-12-04 DIAGNOSIS — D689 Coagulation defect, unspecified: Secondary | ICD-10-CM | POA: Diagnosis not present

## 2016-12-04 DIAGNOSIS — N186 End stage renal disease: Secondary | ICD-10-CM | POA: Diagnosis not present

## 2016-12-04 DIAGNOSIS — D631 Anemia in chronic kidney disease: Secondary | ICD-10-CM | POA: Diagnosis not present

## 2016-12-04 DIAGNOSIS — N2581 Secondary hyperparathyroidism of renal origin: Secondary | ICD-10-CM | POA: Diagnosis not present

## 2016-12-06 DIAGNOSIS — N186 End stage renal disease: Secondary | ICD-10-CM | POA: Diagnosis not present

## 2016-12-06 DIAGNOSIS — D631 Anemia in chronic kidney disease: Secondary | ICD-10-CM | POA: Diagnosis not present

## 2016-12-06 DIAGNOSIS — N2581 Secondary hyperparathyroidism of renal origin: Secondary | ICD-10-CM | POA: Diagnosis not present

## 2016-12-06 DIAGNOSIS — D689 Coagulation defect, unspecified: Secondary | ICD-10-CM | POA: Diagnosis not present

## 2016-12-09 DIAGNOSIS — D689 Coagulation defect, unspecified: Secondary | ICD-10-CM | POA: Diagnosis not present

## 2016-12-09 DIAGNOSIS — N2581 Secondary hyperparathyroidism of renal origin: Secondary | ICD-10-CM | POA: Diagnosis not present

## 2016-12-09 DIAGNOSIS — N186 End stage renal disease: Secondary | ICD-10-CM | POA: Diagnosis not present

## 2016-12-11 DIAGNOSIS — N186 End stage renal disease: Secondary | ICD-10-CM | POA: Diagnosis not present

## 2016-12-11 DIAGNOSIS — D689 Coagulation defect, unspecified: Secondary | ICD-10-CM | POA: Diagnosis not present

## 2016-12-11 DIAGNOSIS — N2581 Secondary hyperparathyroidism of renal origin: Secondary | ICD-10-CM | POA: Diagnosis not present

## 2016-12-13 DIAGNOSIS — N2581 Secondary hyperparathyroidism of renal origin: Secondary | ICD-10-CM | POA: Diagnosis not present

## 2016-12-13 DIAGNOSIS — N186 End stage renal disease: Secondary | ICD-10-CM | POA: Diagnosis not present

## 2016-12-13 DIAGNOSIS — D689 Coagulation defect, unspecified: Secondary | ICD-10-CM | POA: Diagnosis not present

## 2016-12-13 MED FILL — AMLODIPINE BESYLATE 10 MG T: 10 | 90 days supply | Qty: 90 | Fill #1 | Status: TO

## 2016-12-16 DIAGNOSIS — D689 Coagulation defect, unspecified: Secondary | ICD-10-CM | POA: Diagnosis not present

## 2016-12-16 DIAGNOSIS — E1129 Type 2 diabetes mellitus with other diabetic kidney complication: Secondary | ICD-10-CM | POA: Diagnosis not present

## 2016-12-16 DIAGNOSIS — N2581 Secondary hyperparathyroidism of renal origin: Secondary | ICD-10-CM | POA: Diagnosis not present

## 2016-12-16 DIAGNOSIS — D631 Anemia in chronic kidney disease: Secondary | ICD-10-CM | POA: Diagnosis not present

## 2016-12-16 DIAGNOSIS — N186 End stage renal disease: Secondary | ICD-10-CM | POA: Diagnosis not present

## 2016-12-18 DIAGNOSIS — D689 Coagulation defect, unspecified: Secondary | ICD-10-CM | POA: Diagnosis not present

## 2016-12-18 DIAGNOSIS — N2581 Secondary hyperparathyroidism of renal origin: Secondary | ICD-10-CM | POA: Diagnosis not present

## 2016-12-18 DIAGNOSIS — N186 End stage renal disease: Secondary | ICD-10-CM | POA: Diagnosis not present

## 2016-12-18 DIAGNOSIS — E1129 Type 2 diabetes mellitus with other diabetic kidney complication: Secondary | ICD-10-CM | POA: Diagnosis not present

## 2016-12-18 DIAGNOSIS — D631 Anemia in chronic kidney disease: Secondary | ICD-10-CM | POA: Diagnosis not present

## 2016-12-20 DIAGNOSIS — N186 End stage renal disease: Secondary | ICD-10-CM | POA: Diagnosis not present

## 2016-12-20 DIAGNOSIS — D631 Anemia in chronic kidney disease: Secondary | ICD-10-CM | POA: Diagnosis not present

## 2016-12-20 DIAGNOSIS — E1129 Type 2 diabetes mellitus with other diabetic kidney complication: Secondary | ICD-10-CM | POA: Diagnosis not present

## 2016-12-20 DIAGNOSIS — N2581 Secondary hyperparathyroidism of renal origin: Secondary | ICD-10-CM | POA: Diagnosis not present

## 2016-12-20 DIAGNOSIS — D689 Coagulation defect, unspecified: Secondary | ICD-10-CM | POA: Diagnosis not present

## 2016-12-20 MED FILL — LIDOCAINE-PRILOCAINE CREAM: 2.5-2.5 | 14 days supply | Qty: 30 | Fill #0

## 2016-12-20 MED FILL — BRILINTA 90 MG TABLET: 90 | 30 days supply | Qty: 60 | Fill #5

## 2016-12-23 DIAGNOSIS — N186 End stage renal disease: Secondary | ICD-10-CM | POA: Diagnosis not present

## 2016-12-23 DIAGNOSIS — D689 Coagulation defect, unspecified: Secondary | ICD-10-CM | POA: Diagnosis not present

## 2016-12-23 DIAGNOSIS — N2581 Secondary hyperparathyroidism of renal origin: Secondary | ICD-10-CM | POA: Diagnosis not present

## 2016-12-24 DIAGNOSIS — Z992 Dependence on renal dialysis: Secondary | ICD-10-CM | POA: Diagnosis not present

## 2016-12-24 DIAGNOSIS — N186 End stage renal disease: Secondary | ICD-10-CM | POA: Diagnosis not present

## 2016-12-24 DIAGNOSIS — E1129 Type 2 diabetes mellitus with other diabetic kidney complication: Secondary | ICD-10-CM | POA: Diagnosis not present

## 2016-12-25 DIAGNOSIS — N186 End stage renal disease: Secondary | ICD-10-CM | POA: Diagnosis not present

## 2016-12-25 DIAGNOSIS — N2581 Secondary hyperparathyroidism of renal origin: Secondary | ICD-10-CM | POA: Diagnosis not present

## 2016-12-25 DIAGNOSIS — D689 Coagulation defect, unspecified: Secondary | ICD-10-CM | POA: Diagnosis not present

## 2016-12-27 DIAGNOSIS — N2581 Secondary hyperparathyroidism of renal origin: Secondary | ICD-10-CM | POA: Diagnosis not present

## 2016-12-27 DIAGNOSIS — N186 End stage renal disease: Secondary | ICD-10-CM | POA: Diagnosis not present

## 2016-12-27 DIAGNOSIS — D689 Coagulation defect, unspecified: Secondary | ICD-10-CM | POA: Diagnosis not present

## 2016-12-30 DIAGNOSIS — D689 Coagulation defect, unspecified: Secondary | ICD-10-CM | POA: Diagnosis not present

## 2016-12-30 DIAGNOSIS — N2581 Secondary hyperparathyroidism of renal origin: Secondary | ICD-10-CM | POA: Diagnosis not present

## 2016-12-30 DIAGNOSIS — D631 Anemia in chronic kidney disease: Secondary | ICD-10-CM | POA: Diagnosis not present

## 2016-12-30 DIAGNOSIS — N186 End stage renal disease: Secondary | ICD-10-CM | POA: Diagnosis not present

## 2017-01-01 DIAGNOSIS — N2581 Secondary hyperparathyroidism of renal origin: Secondary | ICD-10-CM | POA: Diagnosis not present

## 2017-01-01 DIAGNOSIS — N186 End stage renal disease: Secondary | ICD-10-CM | POA: Diagnosis not present

## 2017-01-01 DIAGNOSIS — D689 Coagulation defect, unspecified: Secondary | ICD-10-CM | POA: Diagnosis not present

## 2017-01-01 DIAGNOSIS — D631 Anemia in chronic kidney disease: Secondary | ICD-10-CM | POA: Diagnosis not present

## 2017-01-01 MED FILL — glyBURIDE 5 MG TABS: 5 | 30 days supply | Qty: 60 | Fill #0

## 2017-01-01 MED FILL — METOPROLOL SUCC ER 100 MG T: 100 | 30 days supply | Qty: 30 | Fill #5

## 2017-01-03 DIAGNOSIS — N186 End stage renal disease: Secondary | ICD-10-CM | POA: Diagnosis not present

## 2017-01-03 DIAGNOSIS — D631 Anemia in chronic kidney disease: Secondary | ICD-10-CM | POA: Diagnosis not present

## 2017-01-03 DIAGNOSIS — N2581 Secondary hyperparathyroidism of renal origin: Secondary | ICD-10-CM | POA: Diagnosis not present

## 2017-01-03 DIAGNOSIS — D689 Coagulation defect, unspecified: Secondary | ICD-10-CM | POA: Diagnosis not present

## 2017-01-06 DIAGNOSIS — N186 End stage renal disease: Secondary | ICD-10-CM | POA: Diagnosis not present

## 2017-01-06 DIAGNOSIS — N2581 Secondary hyperparathyroidism of renal origin: Secondary | ICD-10-CM | POA: Diagnosis not present

## 2017-01-06 DIAGNOSIS — D689 Coagulation defect, unspecified: Secondary | ICD-10-CM | POA: Diagnosis not present

## 2017-01-08 DIAGNOSIS — D689 Coagulation defect, unspecified: Secondary | ICD-10-CM | POA: Diagnosis not present

## 2017-01-08 DIAGNOSIS — N186 End stage renal disease: Secondary | ICD-10-CM | POA: Diagnosis not present

## 2017-01-08 DIAGNOSIS — N2581 Secondary hyperparathyroidism of renal origin: Secondary | ICD-10-CM | POA: Diagnosis not present

## 2017-01-10 DIAGNOSIS — D689 Coagulation defect, unspecified: Secondary | ICD-10-CM | POA: Diagnosis not present

## 2017-01-10 DIAGNOSIS — N186 End stage renal disease: Secondary | ICD-10-CM | POA: Diagnosis not present

## 2017-01-10 DIAGNOSIS — N2581 Secondary hyperparathyroidism of renal origin: Secondary | ICD-10-CM | POA: Diagnosis not present

## 2017-01-13 DIAGNOSIS — D631 Anemia in chronic kidney disease: Secondary | ICD-10-CM | POA: Diagnosis not present

## 2017-01-13 DIAGNOSIS — N186 End stage renal disease: Secondary | ICD-10-CM | POA: Diagnosis not present

## 2017-01-13 DIAGNOSIS — E1129 Type 2 diabetes mellitus with other diabetic kidney complication: Secondary | ICD-10-CM | POA: Diagnosis not present

## 2017-01-13 DIAGNOSIS — D689 Coagulation defect, unspecified: Secondary | ICD-10-CM | POA: Diagnosis not present

## 2017-01-13 DIAGNOSIS — N2581 Secondary hyperparathyroidism of renal origin: Secondary | ICD-10-CM | POA: Diagnosis not present

## 2017-01-15 DIAGNOSIS — N2581 Secondary hyperparathyroidism of renal origin: Secondary | ICD-10-CM | POA: Diagnosis not present

## 2017-01-15 DIAGNOSIS — D689 Coagulation defect, unspecified: Secondary | ICD-10-CM | POA: Diagnosis not present

## 2017-01-15 DIAGNOSIS — E1129 Type 2 diabetes mellitus with other diabetic kidney complication: Secondary | ICD-10-CM | POA: Diagnosis not present

## 2017-01-15 DIAGNOSIS — N186 End stage renal disease: Secondary | ICD-10-CM | POA: Diagnosis not present

## 2017-01-15 DIAGNOSIS — D631 Anemia in chronic kidney disease: Secondary | ICD-10-CM | POA: Diagnosis not present

## 2017-01-17 DIAGNOSIS — D689 Coagulation defect, unspecified: Secondary | ICD-10-CM | POA: Diagnosis not present

## 2017-01-17 DIAGNOSIS — N186 End stage renal disease: Secondary | ICD-10-CM | POA: Diagnosis not present

## 2017-01-17 DIAGNOSIS — E1129 Type 2 diabetes mellitus with other diabetic kidney complication: Secondary | ICD-10-CM | POA: Diagnosis not present

## 2017-01-17 DIAGNOSIS — N2581 Secondary hyperparathyroidism of renal origin: Secondary | ICD-10-CM | POA: Diagnosis not present

## 2017-01-17 DIAGNOSIS — D631 Anemia in chronic kidney disease: Secondary | ICD-10-CM | POA: Diagnosis not present

## 2017-01-20 DIAGNOSIS — D689 Coagulation defect, unspecified: Secondary | ICD-10-CM | POA: Diagnosis not present

## 2017-01-20 DIAGNOSIS — N2581 Secondary hyperparathyroidism of renal origin: Secondary | ICD-10-CM | POA: Diagnosis not present

## 2017-01-20 DIAGNOSIS — N186 End stage renal disease: Secondary | ICD-10-CM | POA: Diagnosis not present

## 2017-01-21 MED FILL — BRILINTA 90 MG TABLET: 90 | 30 days supply | Qty: 60 | Fill #6

## 2017-01-22 DIAGNOSIS — N2581 Secondary hyperparathyroidism of renal origin: Secondary | ICD-10-CM | POA: Diagnosis not present

## 2017-01-22 DIAGNOSIS — D689 Coagulation defect, unspecified: Secondary | ICD-10-CM | POA: Diagnosis not present

## 2017-01-22 DIAGNOSIS — N186 End stage renal disease: Secondary | ICD-10-CM | POA: Diagnosis not present

## 2017-01-24 DIAGNOSIS — E1129 Type 2 diabetes mellitus with other diabetic kidney complication: Secondary | ICD-10-CM | POA: Diagnosis not present

## 2017-01-24 DIAGNOSIS — Z992 Dependence on renal dialysis: Secondary | ICD-10-CM | POA: Diagnosis not present

## 2017-01-24 DIAGNOSIS — N186 End stage renal disease: Secondary | ICD-10-CM | POA: Diagnosis not present

## 2017-01-24 DIAGNOSIS — N2581 Secondary hyperparathyroidism of renal origin: Secondary | ICD-10-CM | POA: Diagnosis not present

## 2017-01-24 DIAGNOSIS — D689 Coagulation defect, unspecified: Secondary | ICD-10-CM | POA: Diagnosis not present

## 2017-01-27 DIAGNOSIS — D689 Coagulation defect, unspecified: Secondary | ICD-10-CM | POA: Diagnosis not present

## 2017-01-27 DIAGNOSIS — D631 Anemia in chronic kidney disease: Secondary | ICD-10-CM | POA: Diagnosis not present

## 2017-01-27 DIAGNOSIS — N186 End stage renal disease: Secondary | ICD-10-CM | POA: Diagnosis not present

## 2017-01-27 DIAGNOSIS — N2581 Secondary hyperparathyroidism of renal origin: Secondary | ICD-10-CM | POA: Diagnosis not present

## 2017-01-29 DIAGNOSIS — D631 Anemia in chronic kidney disease: Secondary | ICD-10-CM | POA: Diagnosis not present

## 2017-01-29 DIAGNOSIS — N2581 Secondary hyperparathyroidism of renal origin: Secondary | ICD-10-CM | POA: Diagnosis not present

## 2017-01-29 DIAGNOSIS — N186 End stage renal disease: Secondary | ICD-10-CM | POA: Diagnosis not present

## 2017-01-29 DIAGNOSIS — D689 Coagulation defect, unspecified: Secondary | ICD-10-CM | POA: Diagnosis not present

## 2017-01-30 MED FILL — METOPROLOL SUCC ER 100 MG T: 100 | 30 days supply | Qty: 30 | Fill #6

## 2017-01-31 DIAGNOSIS — D631 Anemia in chronic kidney disease: Secondary | ICD-10-CM | POA: Diagnosis not present

## 2017-01-31 DIAGNOSIS — N186 End stage renal disease: Secondary | ICD-10-CM | POA: Diagnosis not present

## 2017-01-31 DIAGNOSIS — N2581 Secondary hyperparathyroidism of renal origin: Secondary | ICD-10-CM | POA: Diagnosis not present

## 2017-01-31 DIAGNOSIS — D689 Coagulation defect, unspecified: Secondary | ICD-10-CM | POA: Diagnosis not present

## 2017-02-03 DIAGNOSIS — N186 End stage renal disease: Secondary | ICD-10-CM | POA: Diagnosis not present

## 2017-02-03 DIAGNOSIS — N2581 Secondary hyperparathyroidism of renal origin: Secondary | ICD-10-CM | POA: Diagnosis not present

## 2017-02-03 DIAGNOSIS — D689 Coagulation defect, unspecified: Secondary | ICD-10-CM | POA: Diagnosis not present

## 2017-02-05 DIAGNOSIS — D689 Coagulation defect, unspecified: Secondary | ICD-10-CM | POA: Diagnosis not present

## 2017-02-05 DIAGNOSIS — N2581 Secondary hyperparathyroidism of renal origin: Secondary | ICD-10-CM | POA: Diagnosis not present

## 2017-02-05 DIAGNOSIS — N186 End stage renal disease: Secondary | ICD-10-CM | POA: Diagnosis not present

## 2017-02-07 DIAGNOSIS — D689 Coagulation defect, unspecified: Secondary | ICD-10-CM | POA: Diagnosis not present

## 2017-02-07 DIAGNOSIS — N2581 Secondary hyperparathyroidism of renal origin: Secondary | ICD-10-CM | POA: Diagnosis not present

## 2017-02-07 DIAGNOSIS — N186 End stage renal disease: Secondary | ICD-10-CM | POA: Diagnosis not present

## 2017-02-10 DIAGNOSIS — N186 End stage renal disease: Secondary | ICD-10-CM | POA: Diagnosis not present

## 2017-02-10 DIAGNOSIS — N2581 Secondary hyperparathyroidism of renal origin: Secondary | ICD-10-CM | POA: Diagnosis not present

## 2017-02-10 DIAGNOSIS — D631 Anemia in chronic kidney disease: Secondary | ICD-10-CM | POA: Diagnosis not present

## 2017-02-10 DIAGNOSIS — D689 Coagulation defect, unspecified: Secondary | ICD-10-CM | POA: Diagnosis not present

## 2017-02-12 DIAGNOSIS — N186 End stage renal disease: Secondary | ICD-10-CM | POA: Diagnosis not present

## 2017-02-12 DIAGNOSIS — D689 Coagulation defect, unspecified: Secondary | ICD-10-CM | POA: Diagnosis not present

## 2017-02-12 DIAGNOSIS — N2581 Secondary hyperparathyroidism of renal origin: Secondary | ICD-10-CM | POA: Diagnosis not present

## 2017-02-12 DIAGNOSIS — D631 Anemia in chronic kidney disease: Secondary | ICD-10-CM | POA: Diagnosis not present

## 2017-02-13 MED FILL — LIDOCAINE-PRILOCAINE CREAM: 2.5-2.5 | 14 days supply | Qty: 30 | Fill #1

## 2017-02-13 MED FILL — glyBURIDE 5 MG TABS: 5 | 90 days supply | Qty: 180 | Fill #1 | Status: TO

## 2017-02-14 DIAGNOSIS — N2581 Secondary hyperparathyroidism of renal origin: Secondary | ICD-10-CM | POA: Diagnosis not present

## 2017-02-14 DIAGNOSIS — N186 End stage renal disease: Secondary | ICD-10-CM | POA: Diagnosis not present

## 2017-02-14 DIAGNOSIS — D689 Coagulation defect, unspecified: Secondary | ICD-10-CM | POA: Diagnosis not present

## 2017-02-14 DIAGNOSIS — D631 Anemia in chronic kidney disease: Secondary | ICD-10-CM | POA: Diagnosis not present

## 2017-02-17 DIAGNOSIS — N186 End stage renal disease: Secondary | ICD-10-CM | POA: Diagnosis not present

## 2017-02-17 DIAGNOSIS — D689 Coagulation defect, unspecified: Secondary | ICD-10-CM | POA: Diagnosis not present

## 2017-02-17 DIAGNOSIS — E1129 Type 2 diabetes mellitus with other diabetic kidney complication: Secondary | ICD-10-CM | POA: Diagnosis not present

## 2017-02-17 DIAGNOSIS — N2581 Secondary hyperparathyroidism of renal origin: Secondary | ICD-10-CM | POA: Diagnosis not present

## 2017-02-19 DIAGNOSIS — E1129 Type 2 diabetes mellitus with other diabetic kidney complication: Secondary | ICD-10-CM | POA: Diagnosis not present

## 2017-02-19 DIAGNOSIS — D689 Coagulation defect, unspecified: Secondary | ICD-10-CM | POA: Diagnosis not present

## 2017-02-19 DIAGNOSIS — N186 End stage renal disease: Secondary | ICD-10-CM | POA: Diagnosis not present

## 2017-02-19 DIAGNOSIS — N2581 Secondary hyperparathyroidism of renal origin: Secondary | ICD-10-CM | POA: Diagnosis not present

## 2017-02-19 MED FILL — ASPIRIN ADULT LOW STRENGTH: 81 | 90 days supply | Qty: 90 | Fill #3 | Status: TO

## 2017-02-19 MED FILL — BRILINTA 90 MG TABLET: 90 | 30 days supply | Qty: 60 | Fill #7

## 2017-02-21 DIAGNOSIS — N2581 Secondary hyperparathyroidism of renal origin: Secondary | ICD-10-CM | POA: Diagnosis not present

## 2017-02-21 DIAGNOSIS — D689 Coagulation defect, unspecified: Secondary | ICD-10-CM | POA: Diagnosis not present

## 2017-02-21 DIAGNOSIS — N186 End stage renal disease: Secondary | ICD-10-CM | POA: Diagnosis not present

## 2017-02-21 DIAGNOSIS — E1129 Type 2 diabetes mellitus with other diabetic kidney complication: Secondary | ICD-10-CM | POA: Diagnosis not present

## 2017-02-23 DIAGNOSIS — N186 End stage renal disease: Secondary | ICD-10-CM | POA: Diagnosis not present

## 2017-02-23 DIAGNOSIS — Z992 Dependence on renal dialysis: Secondary | ICD-10-CM | POA: Diagnosis not present

## 2017-02-23 DIAGNOSIS — E1129 Type 2 diabetes mellitus with other diabetic kidney complication: Secondary | ICD-10-CM | POA: Diagnosis not present

## 2017-02-24 DIAGNOSIS — N2581 Secondary hyperparathyroidism of renal origin: Secondary | ICD-10-CM | POA: Diagnosis not present

## 2017-02-24 DIAGNOSIS — N186 End stage renal disease: Secondary | ICD-10-CM | POA: Diagnosis not present

## 2017-02-24 DIAGNOSIS — E1129 Type 2 diabetes mellitus with other diabetic kidney complication: Secondary | ICD-10-CM | POA: Diagnosis not present

## 2017-02-24 DIAGNOSIS — D689 Coagulation defect, unspecified: Secondary | ICD-10-CM | POA: Diagnosis not present

## 2017-02-26 DIAGNOSIS — D689 Coagulation defect, unspecified: Secondary | ICD-10-CM | POA: Diagnosis not present

## 2017-02-26 DIAGNOSIS — N2581 Secondary hyperparathyroidism of renal origin: Secondary | ICD-10-CM | POA: Diagnosis not present

## 2017-02-26 DIAGNOSIS — N186 End stage renal disease: Secondary | ICD-10-CM | POA: Diagnosis not present

## 2017-02-28 DIAGNOSIS — N2581 Secondary hyperparathyroidism of renal origin: Secondary | ICD-10-CM | POA: Diagnosis not present

## 2017-02-28 DIAGNOSIS — N186 End stage renal disease: Secondary | ICD-10-CM | POA: Diagnosis not present

## 2017-02-28 DIAGNOSIS — D689 Coagulation defect, unspecified: Secondary | ICD-10-CM | POA: Diagnosis not present

## 2017-03-03 MED FILL — METOPROLOL SUCC ER 100 MG T: 100 | 30 days supply | Qty: 30 | Fill #0

## 2017-03-17 MED FILL — AMLODIPINE BESYLATE 10 MG T: 10 | 90 days supply | Qty: 90 | Fill #2 | Status: TO

## 2017-03-18 DIAGNOSIS — Z6833 Body mass index (BMI) 33.0-33.9, adult: Secondary | ICD-10-CM | POA: Diagnosis not present

## 2017-03-18 DIAGNOSIS — N186 End stage renal disease: Secondary | ICD-10-CM | POA: Diagnosis not present

## 2017-03-18 DIAGNOSIS — I10 Essential (primary) hypertension: Secondary | ICD-10-CM | POA: Diagnosis not present

## 2017-03-18 DIAGNOSIS — E1129 Type 2 diabetes mellitus with other diabetic kidney complication: Secondary | ICD-10-CM | POA: Diagnosis not present

## 2017-03-18 DIAGNOSIS — I25118 Atherosclerotic heart disease of native coronary artery with other forms of angina pectoris: Secondary | ICD-10-CM | POA: Diagnosis not present

## 2017-03-21 MED FILL — BRILINTA 90 MG TABLET: 90 | 30 days supply | Qty: 60 | Fill #8 | Status: TO

## 2017-03-26 DIAGNOSIS — Z992 Dependence on renal dialysis: Secondary | ICD-10-CM | POA: Diagnosis not present

## 2017-03-26 DIAGNOSIS — E1129 Type 2 diabetes mellitus with other diabetic kidney complication: Secondary | ICD-10-CM | POA: Diagnosis not present

## 2017-03-26 DIAGNOSIS — N186 End stage renal disease: Secondary | ICD-10-CM | POA: Diagnosis not present

## 2017-03-28 DIAGNOSIS — E1129 Type 2 diabetes mellitus with other diabetic kidney complication: Secondary | ICD-10-CM | POA: Diagnosis not present

## 2017-03-28 DIAGNOSIS — D631 Anemia in chronic kidney disease: Secondary | ICD-10-CM | POA: Diagnosis not present

## 2017-03-28 DIAGNOSIS — N186 End stage renal disease: Secondary | ICD-10-CM | POA: Diagnosis not present

## 2017-03-28 DIAGNOSIS — D689 Coagulation defect, unspecified: Secondary | ICD-10-CM | POA: Diagnosis not present

## 2017-03-28 DIAGNOSIS — N2581 Secondary hyperparathyroidism of renal origin: Secondary | ICD-10-CM | POA: Diagnosis not present

## 2017-04-03 MED FILL — METOPROLOL SUCC ER 100 MG T: 100 | 30 days supply | Qty: 30 | Fill #1 | Status: TO

## 2017-04-22 DIAGNOSIS — Z01818 Encounter for other preprocedural examination: Secondary | ICD-10-CM | POA: Diagnosis not present

## 2017-04-22 DIAGNOSIS — I071 Rheumatic tricuspid insufficiency: Secondary | ICD-10-CM | POA: Diagnosis not present

## 2017-04-25 DIAGNOSIS — Z992 Dependence on renal dialysis: Secondary | ICD-10-CM | POA: Diagnosis not present

## 2017-04-25 DIAGNOSIS — E1129 Type 2 diabetes mellitus with other diabetic kidney complication: Secondary | ICD-10-CM | POA: Diagnosis not present

## 2017-04-25 DIAGNOSIS — N186 End stage renal disease: Secondary | ICD-10-CM | POA: Diagnosis not present

## 2017-04-28 DIAGNOSIS — D689 Coagulation defect, unspecified: Secondary | ICD-10-CM | POA: Diagnosis not present

## 2017-04-28 DIAGNOSIS — D631 Anemia in chronic kidney disease: Secondary | ICD-10-CM | POA: Diagnosis not present

## 2017-04-28 DIAGNOSIS — N186 End stage renal disease: Secondary | ICD-10-CM | POA: Diagnosis not present

## 2017-04-28 DIAGNOSIS — N2581 Secondary hyperparathyroidism of renal origin: Secondary | ICD-10-CM | POA: Diagnosis not present

## 2017-04-28 DIAGNOSIS — E1129 Type 2 diabetes mellitus with other diabetic kidney complication: Secondary | ICD-10-CM | POA: Diagnosis not present

## 2017-05-26 DIAGNOSIS — Z992 Dependence on renal dialysis: Secondary | ICD-10-CM | POA: Diagnosis not present

## 2017-05-26 DIAGNOSIS — N186 End stage renal disease: Secondary | ICD-10-CM | POA: Diagnosis not present

## 2017-05-26 DIAGNOSIS — E1129 Type 2 diabetes mellitus with other diabetic kidney complication: Secondary | ICD-10-CM | POA: Diagnosis not present

## 2017-05-28 DIAGNOSIS — D689 Coagulation defect, unspecified: Secondary | ICD-10-CM | POA: Diagnosis not present

## 2017-05-28 DIAGNOSIS — D631 Anemia in chronic kidney disease: Secondary | ICD-10-CM | POA: Diagnosis not present

## 2017-05-28 DIAGNOSIS — N186 End stage renal disease: Secondary | ICD-10-CM | POA: Diagnosis not present

## 2017-05-28 DIAGNOSIS — E1129 Type 2 diabetes mellitus with other diabetic kidney complication: Secondary | ICD-10-CM | POA: Diagnosis not present

## 2017-05-28 DIAGNOSIS — N2581 Secondary hyperparathyroidism of renal origin: Secondary | ICD-10-CM | POA: Diagnosis not present

## 2017-05-29 DIAGNOSIS — I2510 Atherosclerotic heart disease of native coronary artery without angina pectoris: Secondary | ICD-10-CM | POA: Diagnosis not present

## 2017-05-29 DIAGNOSIS — I120 Hypertensive chronic kidney disease with stage 5 chronic kidney disease or end stage renal disease: Secondary | ICD-10-CM | POA: Diagnosis not present

## 2017-05-29 DIAGNOSIS — Z955 Presence of coronary angioplasty implant and graft: Secondary | ICD-10-CM | POA: Diagnosis not present

## 2017-05-29 DIAGNOSIS — Z79899 Other long term (current) drug therapy: Secondary | ICD-10-CM | POA: Diagnosis not present

## 2017-05-29 DIAGNOSIS — I129 Hypertensive chronic kidney disease with stage 1 through stage 4 chronic kidney disease, or unspecified chronic kidney disease: Secondary | ICD-10-CM | POA: Diagnosis not present

## 2017-05-29 DIAGNOSIS — Z992 Dependence on renal dialysis: Secondary | ICD-10-CM | POA: Diagnosis not present

## 2017-05-29 DIAGNOSIS — N186 End stage renal disease: Secondary | ICD-10-CM | POA: Diagnosis not present

## 2017-05-29 DIAGNOSIS — Z7902 Long term (current) use of antithrombotics/antiplatelets: Secondary | ICD-10-CM | POA: Diagnosis not present

## 2017-05-29 DIAGNOSIS — Z7982 Long term (current) use of aspirin: Secondary | ICD-10-CM | POA: Diagnosis not present

## 2017-06-17 DIAGNOSIS — Z01818 Encounter for other preprocedural examination: Secondary | ICD-10-CM | POA: Diagnosis not present

## 2017-06-17 DIAGNOSIS — N186 End stage renal disease: Secondary | ICD-10-CM | POA: Diagnosis not present

## 2017-06-26 DIAGNOSIS — N186 End stage renal disease: Secondary | ICD-10-CM | POA: Diagnosis not present

## 2017-06-26 DIAGNOSIS — Z992 Dependence on renal dialysis: Secondary | ICD-10-CM | POA: Diagnosis not present

## 2017-06-26 DIAGNOSIS — E1129 Type 2 diabetes mellitus with other diabetic kidney complication: Secondary | ICD-10-CM | POA: Diagnosis not present

## 2017-06-27 DIAGNOSIS — N186 End stage renal disease: Secondary | ICD-10-CM | POA: Diagnosis not present

## 2017-06-27 DIAGNOSIS — Z992 Dependence on renal dialysis: Secondary | ICD-10-CM | POA: Diagnosis not present

## 2017-06-27 DIAGNOSIS — N2581 Secondary hyperparathyroidism of renal origin: Secondary | ICD-10-CM | POA: Diagnosis not present

## 2017-06-27 DIAGNOSIS — D631 Anemia in chronic kidney disease: Secondary | ICD-10-CM | POA: Diagnosis not present

## 2017-06-27 DIAGNOSIS — E1129 Type 2 diabetes mellitus with other diabetic kidney complication: Secondary | ICD-10-CM | POA: Diagnosis not present

## 2017-06-27 DIAGNOSIS — D689 Coagulation defect, unspecified: Secondary | ICD-10-CM | POA: Diagnosis not present

## 2017-07-14 ENCOUNTER — Other Ambulatory Visit: Payer: Self-pay | Admitting: Internal Medicine

## 2017-07-14 DIAGNOSIS — Z1231 Encounter for screening mammogram for malignant neoplasm of breast: Secondary | ICD-10-CM

## 2017-07-15 DIAGNOSIS — Z992 Dependence on renal dialysis: Secondary | ICD-10-CM | POA: Diagnosis not present

## 2017-07-15 DIAGNOSIS — I10 Essential (primary) hypertension: Secondary | ICD-10-CM | POA: Diagnosis not present

## 2017-07-15 DIAGNOSIS — E119 Type 2 diabetes mellitus without complications: Secondary | ICD-10-CM | POA: Diagnosis not present

## 2017-07-15 DIAGNOSIS — Z6833 Body mass index (BMI) 33.0-33.9, adult: Secondary | ICD-10-CM | POA: Diagnosis not present

## 2017-07-15 DIAGNOSIS — N186 End stage renal disease: Secondary | ICD-10-CM | POA: Diagnosis not present

## 2017-07-15 DIAGNOSIS — D631 Anemia in chronic kidney disease: Secondary | ICD-10-CM | POA: Diagnosis not present

## 2017-07-25 DIAGNOSIS — E1129 Type 2 diabetes mellitus with other diabetic kidney complication: Secondary | ICD-10-CM | POA: Diagnosis not present

## 2017-07-25 DIAGNOSIS — N2581 Secondary hyperparathyroidism of renal origin: Secondary | ICD-10-CM | POA: Diagnosis not present

## 2017-07-25 DIAGNOSIS — Z992 Dependence on renal dialysis: Secondary | ICD-10-CM | POA: Diagnosis not present

## 2017-07-25 DIAGNOSIS — D631 Anemia in chronic kidney disease: Secondary | ICD-10-CM | POA: Diagnosis not present

## 2017-07-25 DIAGNOSIS — Z23 Encounter for immunization: Secondary | ICD-10-CM | POA: Diagnosis not present

## 2017-07-25 DIAGNOSIS — D689 Coagulation defect, unspecified: Secondary | ICD-10-CM | POA: Diagnosis not present

## 2017-07-25 DIAGNOSIS — N186 End stage renal disease: Secondary | ICD-10-CM | POA: Diagnosis not present

## 2017-07-28 ENCOUNTER — Ambulatory Visit
Admission: RE | Admit: 2017-07-28 | Discharge: 2017-07-28 | Disposition: A | Discharge status: Home / Self Care | Payer: Medicare HMO | Source: Ambulatory Visit | Attending: Internal Medicine | Admitting: Internal Medicine

## 2017-07-28 ENCOUNTER — Ambulatory Visit: Payer: Self-pay

## 2017-07-28 DIAGNOSIS — Z1231 Encounter for screening mammogram for malignant neoplasm of breast: Secondary | ICD-10-CM

## 2017-08-25 DIAGNOSIS — D689 Coagulation defect, unspecified: Secondary | ICD-10-CM | POA: Diagnosis not present

## 2017-08-25 DIAGNOSIS — E1129 Type 2 diabetes mellitus with other diabetic kidney complication: Secondary | ICD-10-CM | POA: Diagnosis not present

## 2017-08-25 DIAGNOSIS — Z992 Dependence on renal dialysis: Secondary | ICD-10-CM | POA: Diagnosis not present

## 2017-08-25 DIAGNOSIS — N186 End stage renal disease: Secondary | ICD-10-CM | POA: Diagnosis not present

## 2017-08-25 DIAGNOSIS — N2581 Secondary hyperparathyroidism of renal origin: Secondary | ICD-10-CM | POA: Diagnosis not present

## 2017-09-18 DIAGNOSIS — E1169 Type 2 diabetes mellitus with other specified complication: Secondary | ICD-10-CM | POA: Diagnosis not present

## 2017-09-18 DIAGNOSIS — Z0000 Encounter for general adult medical examination without abnormal findings: Secondary | ICD-10-CM | POA: Diagnosis not present

## 2017-09-18 DIAGNOSIS — I120 Hypertensive chronic kidney disease with stage 5 chronic kidney disease or end stage renal disease: Secondary | ICD-10-CM | POA: Diagnosis not present

## 2017-09-18 DIAGNOSIS — D631 Anemia in chronic kidney disease: Secondary | ICD-10-CM | POA: Diagnosis not present

## 2017-09-18 DIAGNOSIS — N2581 Secondary hyperparathyroidism of renal origin: Secondary | ICD-10-CM | POA: Diagnosis not present

## 2017-09-18 DIAGNOSIS — H268 Other specified cataract: Secondary | ICD-10-CM | POA: Diagnosis not present

## 2017-09-18 DIAGNOSIS — I25118 Atherosclerotic heart disease of native coronary artery with other forms of angina pectoris: Secondary | ICD-10-CM | POA: Diagnosis not present

## 2017-09-18 DIAGNOSIS — I10 Essential (primary) hypertension: Secondary | ICD-10-CM | POA: Diagnosis not present

## 2017-09-18 DIAGNOSIS — Z23 Encounter for immunization: Secondary | ICD-10-CM | POA: Diagnosis not present

## 2017-09-18 DIAGNOSIS — Z992 Dependence on renal dialysis: Secondary | ICD-10-CM | POA: Diagnosis not present

## 2017-09-18 DIAGNOSIS — N186 End stage renal disease: Secondary | ICD-10-CM | POA: Diagnosis not present

## 2017-09-24 DIAGNOSIS — N186 End stage renal disease: Secondary | ICD-10-CM | POA: Diagnosis not present

## 2017-09-24 DIAGNOSIS — N2581 Secondary hyperparathyroidism of renal origin: Secondary | ICD-10-CM | POA: Diagnosis not present

## 2017-09-24 DIAGNOSIS — D689 Coagulation defect, unspecified: Secondary | ICD-10-CM | POA: Diagnosis not present

## 2017-09-24 DIAGNOSIS — Z992 Dependence on renal dialysis: Secondary | ICD-10-CM | POA: Diagnosis not present

## 2017-09-24 DIAGNOSIS — D509 Iron deficiency anemia, unspecified: Secondary | ICD-10-CM | POA: Diagnosis not present

## 2017-09-24 DIAGNOSIS — E1129 Type 2 diabetes mellitus with other diabetic kidney complication: Secondary | ICD-10-CM | POA: Diagnosis not present

## 2017-09-24 DIAGNOSIS — D631 Anemia in chronic kidney disease: Secondary | ICD-10-CM | POA: Diagnosis not present

## 2017-10-24 DIAGNOSIS — Z992 Dependence on renal dialysis: Secondary | ICD-10-CM | POA: Diagnosis not present

## 2017-10-24 DIAGNOSIS — E1129 Type 2 diabetes mellitus with other diabetic kidney complication: Secondary | ICD-10-CM | POA: Diagnosis not present

## 2017-10-24 DIAGNOSIS — N186 End stage renal disease: Secondary | ICD-10-CM | POA: Diagnosis not present

## 2017-10-27 DIAGNOSIS — D689 Coagulation defect, unspecified: Secondary | ICD-10-CM | POA: Diagnosis not present

## 2017-10-27 DIAGNOSIS — N186 End stage renal disease: Secondary | ICD-10-CM | POA: Diagnosis not present

## 2017-10-27 DIAGNOSIS — D631 Anemia in chronic kidney disease: Secondary | ICD-10-CM | POA: Diagnosis not present

## 2017-10-27 DIAGNOSIS — Z992 Dependence on renal dialysis: Secondary | ICD-10-CM | POA: Diagnosis not present

## 2017-10-27 DIAGNOSIS — N2581 Secondary hyperparathyroidism of renal origin: Secondary | ICD-10-CM | POA: Diagnosis not present

## 2017-10-27 DIAGNOSIS — E1129 Type 2 diabetes mellitus with other diabetic kidney complication: Secondary | ICD-10-CM | POA: Diagnosis not present

## 2017-10-27 DIAGNOSIS — D509 Iron deficiency anemia, unspecified: Secondary | ICD-10-CM | POA: Diagnosis not present

## 2017-11-23 DIAGNOSIS — E1129 Type 2 diabetes mellitus with other diabetic kidney complication: Secondary | ICD-10-CM | POA: Diagnosis not present

## 2017-11-23 DIAGNOSIS — Z992 Dependence on renal dialysis: Secondary | ICD-10-CM | POA: Diagnosis not present

## 2017-11-23 DIAGNOSIS — N186 End stage renal disease: Secondary | ICD-10-CM | POA: Diagnosis not present

## 2017-11-24 DIAGNOSIS — D631 Anemia in chronic kidney disease: Secondary | ICD-10-CM | POA: Diagnosis not present

## 2017-11-24 DIAGNOSIS — D689 Coagulation defect, unspecified: Secondary | ICD-10-CM | POA: Diagnosis not present

## 2017-11-24 DIAGNOSIS — Z992 Dependence on renal dialysis: Secondary | ICD-10-CM | POA: Diagnosis not present

## 2017-11-24 DIAGNOSIS — E1129 Type 2 diabetes mellitus with other diabetic kidney complication: Secondary | ICD-10-CM | POA: Diagnosis not present

## 2017-11-24 DIAGNOSIS — Z111 Encounter for screening for respiratory tuberculosis: Secondary | ICD-10-CM | POA: Diagnosis not present

## 2017-11-24 DIAGNOSIS — N2581 Secondary hyperparathyroidism of renal origin: Secondary | ICD-10-CM | POA: Diagnosis not present

## 2017-11-24 DIAGNOSIS — N186 End stage renal disease: Secondary | ICD-10-CM | POA: Diagnosis not present

## 2017-12-11 DIAGNOSIS — N186 End stage renal disease: Secondary | ICD-10-CM | POA: Diagnosis not present

## 2017-12-11 DIAGNOSIS — Z992 Dependence on renal dialysis: Secondary | ICD-10-CM | POA: Diagnosis not present

## 2017-12-15 DIAGNOSIS — Z992 Dependence on renal dialysis: Secondary | ICD-10-CM | POA: Diagnosis not present

## 2017-12-15 DIAGNOSIS — N186 End stage renal disease: Secondary | ICD-10-CM | POA: Diagnosis not present

## 2017-12-24 DIAGNOSIS — E1129 Type 2 diabetes mellitus with other diabetic kidney complication: Secondary | ICD-10-CM | POA: Diagnosis not present

## 2017-12-24 DIAGNOSIS — Z992 Dependence on renal dialysis: Secondary | ICD-10-CM | POA: Diagnosis not present

## 2017-12-24 DIAGNOSIS — N186 End stage renal disease: Secondary | ICD-10-CM | POA: Diagnosis not present

## 2017-12-26 DIAGNOSIS — N2581 Secondary hyperparathyroidism of renal origin: Secondary | ICD-10-CM | POA: Diagnosis not present

## 2017-12-26 DIAGNOSIS — D631 Anemia in chronic kidney disease: Secondary | ICD-10-CM | POA: Diagnosis not present

## 2017-12-26 DIAGNOSIS — D689 Coagulation defect, unspecified: Secondary | ICD-10-CM | POA: Diagnosis not present

## 2017-12-26 DIAGNOSIS — N186 End stage renal disease: Secondary | ICD-10-CM | POA: Diagnosis not present

## 2018-01-05 NOTE — Progress Notes (Signed)
[facility] Note  01/06/2018     CHIEF COMPLAINT Patient presents for Retina Evaluation and Diabetic Eye Exam   HISTORY OF PRESENT ILLNESS: Kristy Santos is a 67 y.o. female who presents to the clinic today for:   HPI    Retina Evaluation    In both eyes.  Associated Symptoms Negative for Flashes, Blind Spot, Photophobia, Scalp Tenderness, Fever, Floaters, Pain, Glare, Jaw Claudication, Weight Loss, Distortion, Trauma, Shoulder/Hip pain, Fatigue and Redness.  Context:  distance vision, mid-range vision and near vision.  Treatments tried include surgery.  Response to treatment was significant improvement.  I, the attending physician,  performed the HPI with the patient and updated documentation appropriately.          Diabetic Eye Exam    Vision is stable.  Associated Symptoms Negative for Flashes, Blind Spot, Photophobia, Scalp Tenderness, Fever, Floaters, Pain, Glare, Jaw Claudication, Weight Loss, Distortion, Redness, Trauma, Shoulder/Hip pain and Fatigue.  Diabetes characteristics include Type 2 and taking oral medications.  This started 10 years ago.  Blood sugar level fluctuates.  Last Blood Glucose 78.  Last A1C 7.  Associated Diagnosis Dialysis.  I, the attending physician,  performed the HPI with the patient and updated documentation appropriately.          Comments    Referral of DR. Holwerda for DME. Patient states she had floaters until cataract sx seven yrs ago,her vision has significantly improved since sx, she is DM2 x 10 yrs, BS fluctuates,Bs 78(01/05/18) A1c7.0, she is a dialysis pt x 2 yrs. Patient states she has a lot of tearing OD(? Allergy's). Patient denies gtt's, she taking vit's QD.       Last edited by Bernarda Caffey, MD on 01/06/2018  9:21 AM. (History)    Poor historian. 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 History of prior laser, possible injections? Cannot recall whether she has had retina surgery w/ Dr. Baird Cancer. Pt reports having  dialysis Monday, Wednesday, and Friday; Pt states she has had cataract surgery OU -- unable to remember surgeon.   Referring physician: [name], MD [place], [place] [address]  HISTORICAL INFORMATION:   Selected notes from the MEDICAL RECORD NUMBER Referred by [name] for DM exam LEE:  Ocular Hx-pseudo OU PMH-DM (last A1C 6.4, taking Glyburide), heart disease, HTN    CURRENT MEDICATIONS: Current Outpatient Medications (Ophthalmic Drugs)  Medication Sig  . prednisoLONE acetate (PRED FORTE) 1 % ophthalmic suspension Place 1 drop into the left eye 4 (four) times daily for 7 days.   No current facility-administered medications for this visit.  (Ophthalmic Drugs)   Current Outpatient Medications (Other)  Medication Sig  . amLODipine (NORVASC) 5 MG tablet Take 5 mg by mouth 2 (two) times daily.  Marland Kitchen aspirin EC 81 MG tablet Take 81 mg by mouth daily.  . calcium acetate, Phos Binder, (PHOSLYRA) 667 MG/5ML SOLN Take by mouth 3 (three) times daily with meals.  Marland Kitchen glyBURIDE (DIABETA) 5 MG tablet Take 10 mg by mouth 2 (two) times daily.  . metoprolol succinate (TOPROL-XL) 100 MG 24 hr tablet Take 50 mg by mouth at bedtime. Take with or immediately following a meal.   . Multiple Vitamin (MULTIVITAMIN WITH MINERALS) TABS Take 1 tablet by mouth daily.  . calcium carbonate (OS-CAL) 1250 (500 Ca) MG chewable tablet Chew 1 tablet by mouth daily.  . cloNIDine (CATAPRES) 0.1 MG tablet Take 0.1 mg by mouth 2 (two) times daily.  Marland Kitchen  fish oil-omega-3 fatty acids 1000 MG capsule Take 1 g by mouth daily.  . furosemide (LASIX) 20 MG tablet Take 80 mg by mouth daily.   Marland Kitchen oxyCODONE-acetaminophen (ROXICET) 5-325 MG tablet Take 1 tablet by mouth every 6 (six) hours as needed. (Patient not taking: Reported on 01/06/2018)  . sodium bicarbonate 325 MG tablet Take 325 mg by mouth 3 (three) times daily.    No current facility-administered medications for this visit.  (Other)      REVIEW  OF SYSTEMS: ROS    Positive for: Endocrine, Eyes   Negative for: Constitutional, Gastrointestinal, Neurological, Skin, Genitourinary, Musculoskeletal, HENT, Cardiovascular, Respiratory, Psychiatric, Allergic/Imm, Heme/Lymph   Last edited by Zenovia Jordan, LPN on 2/59/5638  7:56 AM. (History)       ALLERGIES Allergies  Allergen Reactions  . Bidil [Isosorb Dinitrate-Hydralazine] Shortness Of Breath    Chest pain, SOB    PAST MEDICAL HISTORY Past Medical History:  Diagnosis Date  . Chronic kidney disease   . Diabetes mellitus   . Hypertension    Past Surgical History:  Procedure Laterality Date  . AV FISTULA PLACEMENT Left 09/19/2015   Procedure: Creation of Left  Arm Brachial Cephalic Arteriovenous Fistula;  Surgeon: Elam Dutch, MD;  Location: [facility];  Service: Vascular;  Laterality: Left;  . BREAST SURGERY  2009   right breast-benign  . CATARACT EXTRACTION    . COLONOSCOPY W/ BIOPSIES AND POLYPECTOMY    . ENDOMETRIAL BIOPSY  12/23/2011  . EYE SURGERY    . MULTIPLE TOOTH EXTRACTIONS    . REFRACTIVE SURGERY  2011   right eye  . TOTAL ABDOMINAL HYSTERECTOMY W/ BILATERAL SALPINGOOPHORECTOMY  01/21/2012   w/ LND    FAMILY HISTORY Family History  Problem Relation Age of Onset  . Cancer Mother   . Diabetes Father     SOCIAL HISTORY Social History   Tobacco Use  . Smoking status: Never Smoker  . Smokeless tobacco: Never Used  . Tobacco comment: never used  Substance Use Topics  . Alcohol use: No  . Drug use: No         OPHTHALMIC EXAM:  Base Eye Exam    Visual Acuity (Snellen - Linear)      Right Left   Dist [place] 20/50 20/25   Dist ph [place] 20/40 NI       Tonometry (Tonopen, 9:02 AM)      Right Left   Pressure 14 16       Pupils      Dark Light Shape React APD   Right 4 3 Round Brisk None   Left 4 3 Round Brisk None       Visual Fields (Counting fingers)      Left Right    Full Full       Extraocular Movement      Right Left    Full,  Ortho Full, Ortho       Neuro/Psych    Oriented x3:  Yes   Mood/Affect:  Normal       Dilation    Both eyes:  1.0% Mydriacyl, 2.5% Phenylephrine @ 9:03 AM        Slit Lamp and Fundus Exam    Slit Lamp Exam      Right Left   Lids/Lashes Dermatochalasis - upper lid, mild Meibomian gland dysfunction Dermatochalasis - upper lid   Conjunctiva/Sclera Mild Melanosis, Nasal Pinguecula Melanosis, Temporal Pinguecula   Cornea Arcus, Temporal Well healed cataract wounds Arcus, Temporal Well healed  cataract wounds   Anterior Chamber Deep and quiet Deep and quiet   Iris Round and dilated, No NVI Round and dilated, No NVI   Lens PC IOL in good position PC IOL in good position   Vitreous fibrosis along distal ST arcade, Vitreous syneresis Vitreous syneresis       Fundus Exam      Right Left   Disc Pink and Sharp fibrosis overlying disc, Temporal Peripapillary atrophy   C/D Ratio 0.2 obscured   Macula Blunted foveal reflex scattered DBH, Superior Exudates, scattered focal laser scars Blunted foveal reflex, Epiretinal membrane, scattered DBH, focal laser scars, small macroaneurysm along IT arcade   Vessels Vascular attenuation, Copper wiring, AV crossing changes, mildly Tortuous, fibrosis along distal ST arcade Vascular attenuation, Copper wiring, AV crossing changes, mildly Tortuous, fibrosis overlying distal ST arcade   Periphery Attached, dense 360 PRP, scattered patches of fibrosis Attached, scattered patteren 360 PRP in place, scattered fibrosis        Refraction    Manifest Refraction      Sphere Cylinder Axis Dist VA   Right -0.50 Sphere  20/40-2   Left [place] +0.50 015 20/20-2          IMAGING AND PROCEDURES  Imaging and Procedures for _0 @  OCT, Retina - OU - Both Eyes       Right Eye Quality was good. Central Foveal Thickness: 311. Progression has no prior data. Findings include abnormal foveal contour, no SRF, retinal drusen , outer retinal atrophy, intraretinal  fluid.   Left Eye Quality was good. Central Foveal Thickness: 218. Progression has no prior data. Findings include normal foveal contour, no SRF, vitreous traction, intraretinal fluid (Trace cystic changes, diffuse atrophy, VMT along distal arcades with IRF).   Notes *Images captured and stored on drive  Diagnosis / Impression:  OD: irregular foveal contour and lamination; +IRF OS: noncentral VMT (distal temporal arcades) with +IRF  Clinical management:  See below  Abbreviations: NFP - Normal foveal profile. CME - cystoid macular edema. PED - pigment epithelial detachment. IRF - intraretinal fluid. SRF - subretinal fluid. EZ - ellipsoid zone. ERM - epiretinal membrane. ORA - outer retinal atrophy. ORT - outer retinal tubulation. SRHM - subretinal hyper-reflective material         Fluorescein Angiography Optos (Transit OD)       Right Eye   Progression has no prior data. Early phase findings include microaneurysm, staining. Mid/Late phase findings include microaneurysm, staining, leakage.   Left Eye   Progression has no prior data. Early phase findings include microaneurysm, staining. Mid/Late phase findings include microaneurysm, staining, leakage.   Notes Images captured and stored on drive;   Impression: Low signal OU OD: late focal leakage SN periphery OS: late focal leakage along IT arcade         Panretinal Photocoagulation - OS - Left Eye       LASER PROCEDURE NOTE  Diagnosis:   Proliferative Diabetic Retinopathy, LEFT EYE  Procedure:  Pan-retinal photocoagulation using slit lamp laser, LEFT EYE, fill-in  Anesthesia:  Topical  Surgeon: Bernarda Caffey, MD, PhD   Informed consent obtained, operative eye marked, and time out performed prior to initiation of laser.   Lumenis [id number] slit lamp laser Pattern: 3x3 square Power: 250 mW Duration: 30 msec  Spot size: 200 microns  # spots: 443 spots mostly inferior and peripheral  fill-in  Complications: None.  RTC: 2 wks  Patient tolerated the procedure well and received written and verbal post-procedure care  information/education.                  ASSESSMENT/PLAN:    ICD-10-CM   1. Proliferative diabetic retinopathy of both eyes with macular edema associated with type 2 diabetes mellitus (HCC) W11.9147 Fluorescein Angiography Optos (Transit OD)    Panretinal Photocoagulation - OS - Left Eye  2. Retinal edema H35.81 OCT, Retina - OU - Both Eyes  3. Essential hypertension I10   4. Hypertensive retinopathy of both eyes H35.033 Fluorescein Angiography Optos (Transit OD)  5. Pseudophakia of both eyes Z96.1   6. Epiretinal membrane (ERM) of left eye H35.372   7. 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 Proliferative diabetic retinopathy w/ mild DME, OU - The incidence, risk factors for progression, natural history and treatment options for diabetic retinopathy were discussed with patient.   - The need for close monitoring of blood glucose, blood pressure, and serum lipids, avoiding cigarette or any type of tobacco, and the need for long term follow up was also discussed with patient. - former pt of Dr. Baird Cancer  History of previous lasers OU and possible injections  Pt to sign records release to obtain records from [facility] - exam shows preretinal fibrosis OU (OS > OD) - FA today (8.13.19) with low signal, but shows late focal peripheral leakage OU - OCT with mild DME, both eyes (OD > OS) - discussed findings and prognosis - recommend PRP fill in OU, OS first today - pt wishes to proceed with laser treatment today 8.13.19 - RBA of procedure discussed, questions answered - informed consent obtained and signed - see procedure note - start PF QID OS x7 days - f/u 2 wks for PRP fill in OD  3,4. Hypertensive retinopathy OU - discussed importance of tight BP control - monitor  5. Pseudophakia OU  - s/p CE/IOL OU -- pt unable to recall  when and with who  - doing well  - monitor  6. Epiretinal membrane, OS The natural history, anatomy, potential for loss of vision, and treatment options including vitrectomy techniques and the complications of endophthalmitis, retinal detachment, vitreous hemorrhage, cataract progression and permanent vision loss discussed with the patient. - mild ERM over macula - monitor  7. Preretinal fibrosis OU - OD: mild patches in periphery - OS: significant focal areas over disc and along distal temporal arcades -- causing traction and IRF - monitor for now -- may require surgical intervention / peeling eventually   Ophthalmic Meds Ordered this visit:  Meds ordered this encounter  Medications  . prednisoLONE acetate (PRED FORTE) 1 % ophthalmic suspension    Sig: Place 1 drop into the left eye 4 (four) times daily for 7 days.    Dispense:  10 mL    Refill:  0       Return in about 2 weeks (around 01/20/2018) for F/U DM ret OU, DFE, OCT.  There are no Patient Instructions on file for this visit.   Explained the diagnoses, plan, and follow up with the patient and they expressed understanding.  Patient expressed understanding of the importance of proper follow up care.   This document serves as a record of services personally performed by Gardiner Sleeper, MD, PhD. It was created on their behalf by Ernest Mallick, OA, an ophthalmic assistant. The creation of this record is the provider's dictation and/or activities during the visit.    Electronically signed by: Ernest Mallick, OA  08.12.2019 5:34 PM   This document serves  as a record of services personally performed by Gardiner Sleeper, MD, PhD. It was created on their behalf by Catha Brow, [place], a certified ophthalmic assistant. The creation of this record is the provider's dictation and/or activities during the visit.  Electronically signed by: Catha Brow, COA  08.13.19 5:34 PM     Gardiner Sleeper, M.D., Ph.D. Diseases & Surgery  of the Retina and Vitreous [facility]   I have reviewed the above documentation for accuracy and completeness, and I agree with the above. Gardiner Sleeper, M.D., Ph.D. 01/06/18 5:34 PM    Abbreviations: M myopia (nearsighted); A astigmatism; H hyperopia (farsighted); P presbyopia; Mrx spectacle prescription;  CTL contact lenses; OD right eye; OS left eye; OU both eyes  XT exotropia; ET esotropia; PEK punctate epithelial keratitis; PEE punctate epithelial erosions; DES dry eye syndrome; MGD meibomian gland dysfunction; ATs artificial tears; PFAT's preservative free artificial tears; [place] nuclear sclerotic cataract; PSC posterior subcapsular cataract; ERM epi-retinal membrane; PVD posterior vitreous detachment; RD retinal detachment; DM diabetes mellitus; DR diabetic retinopathy; NPDR non-proliferative diabetic retinopathy; PDR proliferative diabetic retinopathy; CSME clinically significant macular edema; DME diabetic macular edema; dbh dot blot hemorrhages; CWS cotton wool spot; POAG primary open angle glaucoma; C/D cup-to-disc ratio; HVF humphrey visual field; GVF goldmann visual field; OCT optical coherence tomography; IOP intraocular pressure; BRVO Branch retinal vein occlusion; CRVO central retinal vein occlusion; CRAO central retinal artery occlusion; BRAO branch retinal artery occlusion; RT retinal tear; SB scleral buckle; PPV pars plana vitrectomy; VH Vitreous hemorrhage; PRP panretinal laser photocoagulation; IVK intravitreal kenalog; VMT vitreomacular traction; MH Macular hole;  NVD neovascularization of the disc; NVE neovascularization elsewhere; AREDS age related eye disease study; ARMD age related macular degeneration; POAG primary open angle glaucoma; EBMD epithelial/anterior basement membrane dystrophy; ACIOL anterior chamber intraocular lens; IOL intraocular lens; PCIOL posterior chamber intraocular lens; Phaco/IOL phacoemulsification with intraocular lens placement; Roy  photorefractive keratectomy; LASIK laser assisted in situ keratomileusis; HTN hypertension; DM diabetes mellitus; COPD chronic obstructive pulmonary disease

## 2018-01-06 ENCOUNTER — Encounter (INDEPENDENT_AMBULATORY_CARE_PROVIDER_SITE_OTHER): Payer: Self-pay | Admitting: Ophthalmology

## 2018-01-06 ENCOUNTER — Ambulatory Visit (INDEPENDENT_AMBULATORY_CARE_PROVIDER_SITE_OTHER): Payer: Medicare HMO | Admitting: Ophthalmology

## 2018-01-06 DIAGNOSIS — H35373 Puckering of macula, bilateral: Secondary | ICD-10-CM | POA: Diagnosis not present

## 2018-01-06 DIAGNOSIS — H35033 Hypertensive retinopathy, bilateral: Secondary | ICD-10-CM

## 2018-01-06 DIAGNOSIS — Z961 Presence of intraocular lens: Secondary | ICD-10-CM

## 2018-01-06 DIAGNOSIS — I10 Essential (primary) hypertension: Secondary | ICD-10-CM | POA: Diagnosis not present

## 2018-01-06 DIAGNOSIS — H3581 Retinal edema: Secondary | ICD-10-CM

## 2018-01-06 DIAGNOSIS — H35372 Puckering of macula, left eye: Secondary | ICD-10-CM | POA: Diagnosis not present

## 2018-01-06 DIAGNOSIS — E113513 Type 2 diabetes mellitus with proliferative diabetic retinopathy with macular edema, bilateral: Secondary | ICD-10-CM

## 2018-01-06 MED ORDER — PREDNISOLONE ACETATE 1 % OP SUSP: 1.0000 [drp] | Freq: Four times a day (QID) | OPHTHALMIC | 0 refills | Status: AC

## 2018-01-19 NOTE — Progress Notes (Signed)
[facility] Note  01/20/2018     CHIEF COMPLAINT Patient presents for Retina Follow Up   HISTORY OF PRESENT ILLNESS: Kristy Santos is a 67 y.o. female who presents to the clinic today for:   HPI    Retina Follow Up    Patient presents with  Diabetic Retinopathy.  In both eyes.  This started 2 weeks ago.  Severity is mild.  Since onset it is gradually improving.  I, the attending physician,  performed the HPI with the patient and updated documentation appropriately.          Comments    F/U PDR OU / S/P laser retinopexy Os 01/06/18. Patient states her vision has "slightly improved" Bs 84 this am, denies BS issues. Completed PF as instructed       Last edited by Bernarda Caffey, MD on 01/20/2018  8:45 AM. (History)      Referring physician: No referring provider defined for this encounter.  HISTORICAL INFORMATION:   Selected notes from the MEDICAL RECORD NUMBER Referred by [name] for DM exam LEE:  Ocular Hx-pseudo OU PMH-DM (last A1C 6.4, taking Glyburide), heart disease, HTN    CURRENT MEDICATIONS: No current outpatient medications on file. (Ophthalmic Drugs)   No current facility-administered medications for this visit.  (Ophthalmic Drugs)   Current Outpatient Medications (Other)  Medication Sig  . amLODipine (NORVASC) 5 MG tablet Take 5 mg by mouth 2 (two) times daily.  Marland Kitchen aspirin EC 81 MG tablet Take 81 mg by mouth daily.  . calcium acetate, Phos Binder, (PHOSLYRA) 667 MG/5ML SOLN Take by mouth 3 (three) times daily with meals.  . calcium carbonate (OS-CAL) 1250 (500 Ca) MG chewable tablet Chew 1 tablet by mouth daily.  . cloNIDine (CATAPRES) 0.1 MG tablet Take 0.1 mg by mouth 2 (two) times daily.  . fish oil-omega-3 fatty acids 1000 MG capsule Take 1 g by mouth daily.  . furosemide (LASIX) 20 MG tablet Take 80 mg by mouth daily.   Marland Kitchen glyBURIDE (DIABETA) 5 MG tablet Take 10 mg by mouth 2 (two) times daily.  . metoprolol  succinate (TOPROL-XL) 100 MG 24 hr tablet Take 50 mg by mouth at bedtime. Take with or immediately following a meal.   . Multiple Vitamin (MULTIVITAMIN WITH MINERALS) TABS Take 1 tablet by mouth daily.  Marland Kitchen oxyCODONE-acetaminophen (ROXICET) 5-325 MG tablet Take 1 tablet by mouth every 6 (six) hours as needed.  . sodium bicarbonate 325 MG tablet Take 325 mg by mouth 3 (three) times daily.    No current facility-administered medications for this visit.  (Other)      REVIEW OF SYSTEMS: ROS    Positive for: Endocrine, Eyes   Negative for: Constitutional, Gastrointestinal, Neurological, Skin, Genitourinary, Musculoskeletal, HENT, Cardiovascular, Respiratory, Psychiatric, Allergic/Imm, Heme/Lymph   Last edited by Zenovia Jordan, LPN on 5/36/1443  1:54 AM. (History)       ALLERGIES Allergies  Allergen Reactions  . Bidil [Isosorb Dinitrate-Hydralazine] Shortness Of Breath    Chest pain, SOB    PAST MEDICAL HISTORY Past Medical History:  Diagnosis Date  . Chronic kidney disease   . Diabetes mellitus   . Hypertension    Past Surgical History:  Procedure Laterality Date  . AV FISTULA PLACEMENT Left 09/19/2015   Procedure: Creation of Left  Arm Brachial Cephalic Arteriovenous Fistula;  Surgeon: Elam Dutch, MD;  Location: [place];  Service: Vascular;  Laterality: Left;  . BREAST SURGERY  2009  right breast-benign  . CATARACT EXTRACTION    . COLONOSCOPY W/ BIOPSIES AND POLYPECTOMY    . ENDOMETRIAL BIOPSY  12/23/2011  . EYE SURGERY    . MULTIPLE TOOTH EXTRACTIONS    . REFRACTIVE SURGERY  2011   right eye  . TOTAL ABDOMINAL HYSTERECTOMY W/ BILATERAL SALPINGOOPHORECTOMY  01/21/2012   w/ LND    FAMILY HISTORY Family History  Problem Relation Age of Onset  . Cancer Mother   . Diabetes Father     SOCIAL HISTORY Social History   Tobacco Use  . Smoking status: Never Smoker  . Smokeless tobacco: Never Used  . Tobacco comment: never used  Substance Use Topics  . Alcohol  use: No  . Drug use: No         OPHTHALMIC EXAM:  Base Eye Exam    Visual Acuity (Snellen - Linear)      Right Left   Dist [place] 20/50 20/20 -1   Dist ph [place] 20/40 NI       Tonometry (Tonopen, 8:23 AM)      Right Left   Pressure 14 16       Pupils      Dark Light Shape React APD   Right 4 3 Round Brisk None   Left 4 3 Round Brisk None       Visual Fields (Counting fingers)      Left Right    Full Full       Extraocular Movement      Right Left    Full, Ortho Full, Ortho       Neuro/Psych    Oriented x3:  Yes   Mood/Affect:  Normal       Dilation    Both eyes:  1.0% Mydriacyl, 2.5% Phenylephrine @ 8:23 AM        Slit Lamp and Fundus Exam    Slit Lamp Exam      Right Left   Lids/Lashes Dermatochalasis - upper lid, mild Meibomian gland dysfunction Dermatochalasis - upper lid   Conjunctiva/Sclera Mild Melanosis, Nasal Pinguecula Melanosis, Temporal Pinguecula   Cornea Arcus, Temporal Well healed cataract wounds Arcus, Temporal Well healed cataract wounds   Anterior Chamber Deep and quiet Deep and quiet   Iris Round and dilated, No NVI Round and dilated, No NVI   Lens PC IOL in good position PC IOL in good position   Vitreous fibrosis along distal ST arcade, Vitreous syneresis Vitreous syneresis       Fundus Exam      Right Left   Disc Pink and Sharp fibrosis overlying disc, Temporal Peripapillary atrophy   C/D Ratio 0.2 obscured   Macula Blunted foveal reflex scattered DBH, Superior Exudates, scattered focal laser scars Blunted foveal reflex, Epiretinal membrane, scattered DBH, focal laser scars, small macroaneurysm along IT arcade   Vessels Vascular attenuation, Copper wiring, AV crossing changes, mildly Tortuous, fibrosis along distal ST arcade Vascular attenuation, Copper wiring, AV crossing changes, mildly Tortuous, fibrosis overlying distal ST arcade   Periphery Attached, dense 360 PRP, scattered patches of fibrosis Attached, scattered patteren 360 PRP in  place, scattered fibrosis          IMAGING AND PROCEDURES  Imaging and Procedures for @TODAY @  OCT, Retina - OU - Both Eyes       Right Eye Quality was good. Central Foveal Thickness: 302. Progression has been stable. Findings include abnormal foveal contour, no SRF, retinal drusen , outer retinal atrophy, intraretinal fluid.   Left Eye Quality  was good. Central Foveal Thickness: 218. Progression has been stable. Findings include normal foveal contour, no SRF, vitreous traction, intraretinal fluid (Trace cystic changes, diffuse atrophy, VMT along distal arcades with IRF).   Notes *Images captured and stored on drive  Diagnosis / Impression:  OD: irregular foveal contour and lamination; +IRF OS: noncentral VMT (distal temporal arcades) with +IRF  Clinical management:  See below  Abbreviations: NFP - Normal foveal profile. CME - cystoid macular edema. PED - pigment epithelial detachment. IRF - intraretinal fluid. SRF - subretinal fluid. EZ - ellipsoid zone. ERM - epiretinal membrane. ORA - outer retinal atrophy. ORT - outer retinal tubulation. SRHM - subretinal hyper-reflective material         Panretinal Photocoagulation - OD - Right Eye       LASER PROCEDURE NOTE  Diagnosis:   Proliferative Diabetic Retinopathy, RIGHT EYE  Procedure:  Pan-retinal photocoagulation using slit lamp laser, RIGHT EYE, fill-in  Anesthesia:  Topical  Surgeon: Bernarda Caffey, MD, PhD   Informed consent obtained, operative eye marked, and time out performed prior to initiation of laser.   Lumenis [id number] slit lamp laser Pattern: 2x2 square Power: 250 mW Duration: 40 msec  Spot size: 200 microns  # spots: 268 spots far periphery and fill-in  Complications: None.  RTC: 2 wks  Patient tolerated the procedure well and received written and verbal post-procedure care information/education.                  ASSESSMENT/PLAN:    ICD-10-CM   1. Proliferative diabetic  retinopathy of both eyes with macular edema associated with type 2 diabetes mellitus (HCC) E11.3513 OCT, Retina - OU - Both Eyes    Panretinal Photocoagulation - OD - Right Eye  2. Retinal edema H35.81 OCT, Retina - OU - Both Eyes  3. Essential hypertension I10   4. Hypertensive retinopathy of both eyes H35.033   5. Pseudophakia of both eyes Z96.1   6. Epiretinal membrane (ERM) of left eye H35.372   7. 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 Proliferative diabetic retinopathy w/ mild DME, OU - former pt of Dr. Baird Cancer and Dr. Elonda Husky  Records obtained from [facility]  Dr. Elonda Husky -- [facility] 2008-2009  Dr. Baird Cancer -- PPV, laser for [facility],  8.19.10    Focal laser 2.25.11 - exam shows preretinal fibrosis OU (OS > OD) - FA (8.13.19) with low signal, but shows late focal peripheral leakage OU - OCT with mild DME, both eyes (OD > OS)  - S/P PRP fill in OS (08.13.19) - recommend PRP fill in OD - pt wishes to proceed with laser treatment today (08.27.19) - RBA of procedure discussed, questions answered - informed consent obtained and signed - see procedure note - start PF QID OD x7 days - f/u 2 wks for DFE/OCT/likely IVA OD  3,4. Hypertensive retinopathy OU - discussed importance of tight BP control - monitor  5. Pseudophakia OU  - s/p CE/IOL OU -- pt unable to recall when and with who  - doing well  - monitor  6. Epiretinal membrane, OS The natural history, anatomy, potential for loss of vision, and treatment options including vitrectomy techniques and the complications of endophthalmitis, retinal detachment, vitreous hemorrhage, cataract progression and permanent vision loss discussed with the patient. - mild ERM over macula - monitor  7. Preretinal fibrosis OU - OD: mild patches in periphery - OS: significant focal areas over disc and along distal temporal arcades -- causing traction and IRF -  monitor for now -- may require surgical intervention / peeling  eventually   Ophthalmic Meds Ordered this visit:  No orders of the defined types were placed in this encounter.      Return in about 2 weeks (around 02/03/2018) for F/U PDR OU, DFE, OCT, Possible Injxn.  There are no Patient Instructions on file for this visit.   Explained the diagnoses, plan, and follow up with the patient and they expressed understanding.  Patient expressed understanding of the importance of proper follow up care.   This document serves as a record of services personally performed by Gardiner Sleeper, MD, PhD. It was created on their behalf by Catha Brow, [place], a certified ophthalmic assistant. The creation of this record is the provider's dictation and/or activities during the visit.  Electronically signed by: Catha Brow, COA  08.26.19 9:36 AM  9:36 AM    Gardiner Sleeper, M.D., Ph.D. Diseases & Surgery of the Retina and Vitreous [facility]   I have reviewed the above documentation for accuracy and completeness, and I agree with the above. Gardiner Sleeper, M.D., Ph.D. 01/20/18 9:36 AM   Abbreviations: M myopia (nearsighted); A astigmatism; H hyperopia (farsighted); P presbyopia; Mrx spectacle prescription;  CTL contact lenses; OD right eye; OS left eye; OU both eyes  XT exotropia; ET esotropia; PEK punctate epithelial keratitis; PEE punctate epithelial erosions; DES dry eye syndrome; MGD meibomian gland dysfunction; ATs artificial tears; PFAT's preservative free artificial tears; [place] nuclear sclerotic cataract; PSC posterior subcapsular cataract; ERM epi-retinal membrane; PVD posterior vitreous detachment; RD retinal detachment; DM diabetes mellitus; DR diabetic retinopathy; NPDR non-proliferative diabetic retinopathy; PDR proliferative diabetic retinopathy; CSME clinically significant macular edema; DME diabetic macular edema; dbh dot blot hemorrhages; CWS cotton wool spot; POAG primary open angle glaucoma; C/D cup-to-disc ratio; HVF  humphrey visual field; GVF goldmann visual field; OCT optical coherence tomography; IOP intraocular pressure; BRVO Branch retinal vein occlusion; CRVO central retinal vein occlusion; CRAO central retinal artery occlusion; BRAO branch retinal artery occlusion; RT retinal tear; SB scleral buckle; PPV pars plana vitrectomy; VH Vitreous hemorrhage; PRP panretinal laser photocoagulation; IVK intravitreal kenalog; VMT vitreomacular traction; MH Macular hole;  NVD neovascularization of the disc; NVE neovascularization elsewhere; AREDS age related eye disease study; ARMD age related macular degeneration; POAG primary open angle glaucoma; EBMD epithelial/anterior basement membrane dystrophy; ACIOL anterior chamber intraocular lens; IOL intraocular lens; PCIOL posterior chamber intraocular lens; Phaco/IOL phacoemulsification with intraocular lens placement; [place] photorefractive keratectomy; LASIK laser assisted in situ keratomileusis; HTN hypertension; DM diabetes mellitus; COPD chronic obstructive pulmonary disease

## 2018-01-20 ENCOUNTER — Encounter (INDEPENDENT_AMBULATORY_CARE_PROVIDER_SITE_OTHER): Payer: Self-pay | Admitting: Ophthalmology

## 2018-01-20 ENCOUNTER — Ambulatory Visit (INDEPENDENT_AMBULATORY_CARE_PROVIDER_SITE_OTHER): Payer: Medicare HMO | Admitting: Ophthalmology

## 2018-01-20 DIAGNOSIS — H35372 Puckering of macula, left eye: Secondary | ICD-10-CM

## 2018-01-20 DIAGNOSIS — E113513 Type 2 diabetes mellitus with proliferative diabetic retinopathy with macular edema, bilateral: Secondary | ICD-10-CM

## 2018-01-20 DIAGNOSIS — H3581 Retinal edema: Secondary | ICD-10-CM

## 2018-01-20 DIAGNOSIS — H35033 Hypertensive retinopathy, bilateral: Secondary | ICD-10-CM

## 2018-01-20 DIAGNOSIS — Z961 Presence of intraocular lens: Secondary | ICD-10-CM

## 2018-01-20 DIAGNOSIS — I10 Essential (primary) hypertension: Secondary | ICD-10-CM

## 2018-01-20 DIAGNOSIS — H35373 Puckering of macula, bilateral: Secondary | ICD-10-CM

## 2018-01-24 DIAGNOSIS — E1129 Type 2 diabetes mellitus with other diabetic kidney complication: Secondary | ICD-10-CM | POA: Diagnosis not present

## 2018-01-24 DIAGNOSIS — N186 End stage renal disease: Secondary | ICD-10-CM | POA: Diagnosis not present

## 2018-01-24 DIAGNOSIS — Z992 Dependence on renal dialysis: Secondary | ICD-10-CM | POA: Diagnosis not present

## 2018-01-26 DIAGNOSIS — D689 Coagulation defect, unspecified: Secondary | ICD-10-CM | POA: Diagnosis not present

## 2018-01-26 DIAGNOSIS — E1129 Type 2 diabetes mellitus with other diabetic kidney complication: Secondary | ICD-10-CM | POA: Diagnosis not present

## 2018-01-26 DIAGNOSIS — D631 Anemia in chronic kidney disease: Secondary | ICD-10-CM | POA: Diagnosis not present

## 2018-01-26 DIAGNOSIS — N186 End stage renal disease: Secondary | ICD-10-CM | POA: Diagnosis not present

## 2018-01-26 DIAGNOSIS — N2581 Secondary hyperparathyroidism of renal origin: Secondary | ICD-10-CM | POA: Diagnosis not present

## 2018-01-26 DIAGNOSIS — Z23 Encounter for immunization: Secondary | ICD-10-CM | POA: Diagnosis not present

## 2018-01-26 DIAGNOSIS — Z992 Dependence on renal dialysis: Secondary | ICD-10-CM | POA: Diagnosis not present

## 2018-02-02 NOTE — Progress Notes (Signed)
[facility] Note  02/03/2018     CHIEF COMPLAINT Patient presents for Retina Follow Up   HISTORY OF PRESENT ILLNESS: Kristy Santos is a 67 y.o. female who presents to the clinic today for:   HPI    Retina Follow Up    Patient presents with  Diabetic Retinopathy.  In both eyes.  This started 1 month ago.  Severity is mild.  Since onset it is gradually improving.  I, the attending physician,  performed the HPI with the patient and updated documentation appropriately.          Comments    F/U PDR OU. Patient states she occasionally has floaters OD, denies flashes and ocular pain.Pt states vision has slightly improved. Bs 171 this am, A1C 7.4(12/2017), BS have been WINL per patient.       Last edited by Bernarda Caffey, MD on 02/03/2018  9:12 AM. (History)    Pt states she tolerated PRP fill-in well; Pt reports having occasional floaters OU after having PRP lasers;   Referring physician: No referring provider defined for this encounter.  HISTORICAL INFORMATION:   Selected notes from the MEDICAL RECORD NUMBER Referred by [name] for DM exam LEE:  Ocular Hx-pseudo OU PMH-DM (last A1C 6.4, taking Glyburide), heart disease, HTN    CURRENT MEDICATIONS: No current outpatient medications on file. (Ophthalmic Drugs)   No current facility-administered medications for this visit.  (Ophthalmic Drugs)   Current Outpatient Medications (Other)  Medication Sig  . amLODipine (NORVASC) 5 MG tablet Take 5 mg by mouth 2 (two) times daily.  Marland Kitchen aspirin EC 81 MG tablet Take 81 mg by mouth daily.  . calcium acetate, Phos Binder, (PHOSLYRA) 667 MG/5ML SOLN Take by mouth 3 (three) times daily with meals.  . calcium carbonate (OS-CAL) 1250 (500 Ca) MG chewable tablet Chew 1 tablet by mouth daily.  . cloNIDine (CATAPRES) 0.1 MG tablet Take 0.1 mg by mouth 2 (two) times daily.  . fish oil-omega-3 fatty acids 1000 MG capsule Take 1 g by mouth daily.  .  furosemide (LASIX) 20 MG tablet Take 80 mg by mouth daily.   Marland Kitchen glyBURIDE (DIABETA) 5 MG tablet Take 10 mg by mouth 2 (two) times daily.  . metoprolol succinate (TOPROL-XL) 100 MG 24 hr tablet Take 50 mg by mouth at bedtime. Take with or immediately following a meal.   . Multiple Vitamin (MULTIVITAMIN WITH MINERALS) TABS Take 1 tablet by mouth daily.  Marland Kitchen oxyCODONE-acetaminophen (ROXICET) 5-325 MG tablet Take 1 tablet by mouth every 6 (six) hours as needed.  . sodium bicarbonate 325 MG tablet Take 325 mg by mouth 3 (three) times daily.    Current Facility-Administered Medications (Other)  Medication Route  . Bevacizumab (AVASTIN) SOLN 1.25 mg Intravitreal      REVIEW OF SYSTEMS: ROS    Positive for: Endocrine, Eyes   Negative for: Constitutional, Gastrointestinal, Neurological, Skin, Genitourinary, Musculoskeletal, HENT, Cardiovascular, Respiratory, Psychiatric, Allergic/Imm, Heme/Lymph   Last edited by Zenovia Jordan, LPN on 9/81/1914  7:82 AM. (History)       ALLERGIES Allergies  Allergen Reactions  . Bidil [Isosorb Dinitrate-Hydralazine] Shortness Of Breath    Chest pain, SOB    PAST MEDICAL HISTORY Past Medical History:  Diagnosis Date  . Chronic kidney disease   . Diabetes mellitus   . Hypertension    Past Surgical History:  Procedure Laterality Date  . AV FISTULA PLACEMENT Left 09/19/2015   Procedure: Creation of Left  Arm Brachial Cephalic Arteriovenous Fistula;  Surgeon: Elam Dutch, MD;  Location: [place];  Service: Vascular;  Laterality: Left;  . BREAST SURGERY  2009   right breast-benign  . CATARACT EXTRACTION    . COLONOSCOPY W/ BIOPSIES AND POLYPECTOMY    . ENDOMETRIAL BIOPSY  12/23/2011  . EYE SURGERY    . MULTIPLE TOOTH EXTRACTIONS    . REFRACTIVE SURGERY  2011   right eye  . TOTAL ABDOMINAL HYSTERECTOMY W/ BILATERAL SALPINGOOPHORECTOMY  01/21/2012   w/ LND    FAMILY HISTORY Family History  Problem Relation Age of Onset  . Cancer Mother   .  Diabetes Father     SOCIAL HISTORY Social History   Tobacco Use  . Smoking status: Never Smoker  . Smokeless tobacco: Never Used  . Tobacco comment: never used  Substance Use Topics  . Alcohol use: No  . Drug use: No         OPHTHALMIC EXAM:  Base Eye Exam    Visual Acuity (Snellen - Linear)      Right Left   Dist [place] 20/40 -2 20/20   Dist ph [place] 20/40 -1 NI       Tonometry (Tonopen, 8:19 AM)      Right Left   Pressure 16 14       Pupils      Dark Light Shape React APD   Right 4 3 Round Brisk None   Left 4 3 Round Brisk None       Visual Fields (Counting fingers)      Left Right    Full Full       Extraocular Movement      Right Left    Full, Ortho Full, Ortho       Neuro/Psych    Oriented x3:  Yes   Mood/Affect:  Normal       Dilation    Both eyes:  1.0% Mydriacyl, 2.5% Phenylephrine @ 8:18 AM        Slit Lamp and Fundus Exam    Slit Lamp Exam      Right Left   Lids/Lashes Dermatochalasis - upper lid, mild Meibomian gland dysfunction Dermatochalasis - upper lid   Conjunctiva/Sclera Mild Melanosis, Nasal Pinguecula Melanosis, Temporal Pinguecula   Cornea Arcus, Temporal Well healed cataract wounds Arcus, Temporal Well healed cataract wounds   Anterior Chamber Deep and quiet Deep and quiet   Iris Round and dilated, No NVI Round and dilated, No NVI   Lens PC IOL in good position PC IOL in good position   Vitreous fibrosis along distal ST arcade, Vitreous syneresis Vitreous syneresis       Fundus Exam      Right Left   Disc Pink and Sharp fibrosis overlying disc, Temporal Peripapillary atrophy   C/D Ratio 0.2 obscured   Macula Blunted foveal reflex scattered DBH, Superior Exudates, scattered focal laser scars Blunted foveal reflex, Epiretinal membrane, scattered DBH, focal laser scars, small macroaneurysm along IT arcade   Vessels Vascular attenuation, Copper wiring, AV crossing changes, mildly Tortuous, fibrosis along distal ST arcade Vascular  attenuation, Copper wiring, AV crossing changes, mildly Tortuous, fibrosis overlying distal ST arcade   Periphery Attached, dense 360 PRP and good fill-in, scattered patches of fibrosis Attached, scattered pattern 360 PRP in place with good fill-in, scattered fibrosis          IMAGING AND PROCEDURES  Imaging and Procedures for @TODAY @  OCT, Retina - OU - Both Eyes  Right Eye Quality was good. Central Foveal Thickness: 306. Progression has been stable. Findings include abnormal foveal contour, no SRF, retinal drusen , outer retinal atrophy, intraretinal fluid (Persistent IRF).   Left Eye Quality was good. Central Foveal Thickness: 217. Progression has been stable. Findings include normal foveal contour, no SRF, vitreous traction, intraretinal fluid (Trace cystic changes, diffuse atrophy, VMT along distal arcades with IRF, ? Thickening of PRF).   Notes *Images captured and stored on drive  Diagnosis / Impression:  OD: irregular foveal contour and lamination; +IRF, +DME OS: noncentral VMT (distal temporal arcades) with +IRF  Clinical management:  See below  Abbreviations: NFP - Normal foveal profile. CME - cystoid macular edema. PED - pigment epithelial detachment. IRF - intraretinal fluid. SRF - subretinal fluid. EZ - ellipsoid zone. ERM - epiretinal membrane. ORA - outer retinal atrophy. ORT - outer retinal tubulation. SRHM - subretinal hyper-reflective material         Intravitreal Injection, Pharmacologic Agent - OD - Right Eye       Time Out 02/03/2018. 9:26 AM. Confirmed correct patient, procedure, site, and patient consented.   Anesthesia Anesthetic medications included Tetracaine 0.5%, Lidocaine 2%.   Procedure Preparation included 5% betadine to ocular surface, eyelid speculum. A supplied needle was used.   Injection:  1.25 mg Bevacizumab 1.25mg /0.82ml   NDC: 23300-762-26, Lot: [id number] , Expiration date: 03/18/2018   Route: Intravitreal, Site: Right  Eye, Waste: 0 mg  Post-op Post injection exam found visual acuity of at least counting fingers. The patient tolerated the procedure well. There were no complications. The patient received written and verbal post procedure care education.                 ASSESSMENT/PLAN:    ICD-10-CM   1. Proliferative diabetic retinopathy of both eyes with macular edema associated with type 2 diabetes mellitus (HCC) E11.3513 OCT, Retina - OU - Both Eyes    Intravitreal Injection, Pharmacologic Agent - OD - Right Eye    Bevacizumab (AVASTIN) SOLN 1.25 mg  2. Retinal edema H35.81 OCT, Retina - OU - Both Eyes  3. Essential hypertension I10   4. Hypertensive retinopathy of both eyes H35.033   5. Pseudophakia of both eyes Z96.1   6. Epiretinal membrane (ERM) of left eye H35.372   7. 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 Proliferative diabetic retinopathy w/ mild DME, OU - former pt of Dr. 03/31/2018 and Dr. Baird Cancer  Records obtained from [facility]  Dr. [facility] 2008-2009  Dr. 09-23-1998 -- PPV, laser for [facility],  8.19.10    Focal laser 2.25.11 - exam shows preretinal fibrosis OU (OS > OD) - FA (8.13.19) with low signal, but shows late focal peripheral leakage OU - OCT with mild DME, both eyes (OD > OS)  - S/P PRP fill in OS (08.13.19)  - S/P PRP fill in OD (08.27.19) - recommend IVA OD #1 - pt wishes to proceed with IVA OD today (09.10.19) - RBA of procedure discussed, questions answered - informed consent obtained and signed - see procedure note - f/u 4 wks for DFE/OCT/likely IVA OD  3,4. Hypertensive retinopathy OU - discussed importance of tight BP control - monitor  5. Pseudophakia OU  - s/p CE/IOL OU -- pt unable to recall when and with who  - doing well  - monitor  6. Epiretinal membrane, OS The natural history, anatomy, potential for loss of vision, and treatment options including vitrectomy techniques and the complications of endophthalmitis, retinal  detachment,  vitreous hemorrhage, cataract progression and permanent vision loss discussed with the patient. - mild ERM over macula - monitor  7. Preretinal fibrosis OU - OD: mild patches in periphery - OS: significant focal areas over disc and along distal temporal arcades -- causing traction and IRF - monitor for now -- may require surgical intervention / peeling eventually   Ophthalmic Meds Ordered this visit:  Meds ordered this encounter  Medications  . Bevacizumab (AVASTIN) SOLN 1.25 mg       Return in about 4 weeks (around 03/03/2018) for F/U PDR OU, DFE, OCT.  There are no Patient Instructions on file for this visit.   Explained the diagnoses, plan, and follow up with the patient and they expressed understanding.  Patient expressed understanding of the importance of proper follow up care.   This document serves as a record of services personally performed by Gardiner Sleeper, MD, PhD. It was created on their behalf by Ernest Mallick, OA, an ophthalmic assistant. The creation of this record is the provider's dictation and/or activities during the visit.    Electronically signed by: Ernest Mallick, OA  09.09.19 12:04 PM   This document serves as a record of services personally performed by Gardiner Sleeper, MD, PhD. It was created on their behalf by Catha Brow, [place], a certified ophthalmic assistant. The creation of this record is the provider's dictation and/or activities during the visit.  Electronically signed by: Catha Brow, [place]  09.10.19 12:04 PM   Gardiner Sleeper, M.D., Ph.D. Diseases & Surgery of the Retina and Vitreous [facility]   I have reviewed the above documentation for accuracy and completeness, and I agree with the above. Gardiner Sleeper, M.D., Ph.D. 02/03/18 12:04 PM    Abbreviations: M myopia (nearsighted); A astigmatism; H hyperopia (farsighted); P presbyopia; Mrx spectacle prescription;  CTL contact lenses; OD right eye; OS left eye; OU  both eyes  XT exotropia; ET esotropia; PEK punctate epithelial keratitis; PEE punctate epithelial erosions; DES dry eye syndrome; MGD meibomian gland dysfunction; ATs artificial tears; PFAT's preservative free artificial tears; [place] nuclear sclerotic cataract; PSC posterior subcapsular cataract; ERM epi-retinal membrane; PVD posterior vitreous detachment; RD retinal detachment; DM diabetes mellitus; DR diabetic retinopathy; NPDR non-proliferative diabetic retinopathy; PDR proliferative diabetic retinopathy; CSME clinically significant macular edema; DME diabetic macular edema; dbh dot blot hemorrhages; CWS cotton wool spot; POAG primary open angle glaucoma; C/D cup-to-disc ratio; HVF humphrey visual field; GVF goldmann visual field; OCT optical coherence tomography; IOP intraocular pressure; BRVO Branch retinal vein occlusion; CRVO central retinal vein occlusion; CRAO central retinal artery occlusion; BRAO branch retinal artery occlusion; RT retinal tear; SB scleral buckle; PPV pars plana vitrectomy; VH Vitreous hemorrhage; PRP panretinal laser photocoagulation; IVK intravitreal kenalog; VMT vitreomacular traction; MH Macular hole;  NVD neovascularization of the disc; NVE neovascularization elsewhere; AREDS age related eye disease study; ARMD age related macular degeneration; POAG primary open angle glaucoma; EBMD epithelial/anterior basement membrane dystrophy; ACIOL anterior chamber intraocular lens; IOL intraocular lens; PCIOL posterior chamber intraocular lens; Phaco/IOL phacoemulsification with intraocular lens placement; [place] photorefractive keratectomy; LASIK laser assisted in situ keratomileusis; HTN hypertension; DM diabetes mellitus; COPD chronic obstructive pulmonary disease

## 2018-02-03 ENCOUNTER — Ambulatory Visit (INDEPENDENT_AMBULATORY_CARE_PROVIDER_SITE_OTHER): Payer: Medicare HMO | Admitting: Ophthalmology

## 2018-02-03 ENCOUNTER — Encounter (INDEPENDENT_AMBULATORY_CARE_PROVIDER_SITE_OTHER): Payer: Self-pay | Admitting: Ophthalmology

## 2018-02-03 DIAGNOSIS — H3581 Retinal edema: Secondary | ICD-10-CM

## 2018-02-03 DIAGNOSIS — H35373 Puckering of macula, bilateral: Secondary | ICD-10-CM

## 2018-02-03 DIAGNOSIS — I10 Essential (primary) hypertension: Secondary | ICD-10-CM

## 2018-02-03 DIAGNOSIS — E113513 Type 2 diabetes mellitus with proliferative diabetic retinopathy with macular edema, bilateral: Secondary | ICD-10-CM | POA: Diagnosis not present

## 2018-02-03 DIAGNOSIS — Z961 Presence of intraocular lens: Secondary | ICD-10-CM

## 2018-02-03 DIAGNOSIS — H35372 Puckering of macula, left eye: Secondary | ICD-10-CM

## 2018-02-03 DIAGNOSIS — H35033 Hypertensive retinopathy, bilateral: Secondary | ICD-10-CM

## 2018-02-03 MED ORDER — BEVACIZUMAB CHEMO INJECTION 1.25MG/0.05ML SYRINGE FOR KALEIDOSCOPE
1.2500 mg | INTRAVITREAL | Status: AC
  Administered 2018-02-03: 1.25 mg via INTRAVITREAL

## 2018-02-23 DIAGNOSIS — E1129 Type 2 diabetes mellitus with other diabetic kidney complication: Secondary | ICD-10-CM | POA: Diagnosis not present

## 2018-02-23 DIAGNOSIS — Z992 Dependence on renal dialysis: Secondary | ICD-10-CM | POA: Diagnosis not present

## 2018-02-23 DIAGNOSIS — N186 End stage renal disease: Secondary | ICD-10-CM | POA: Diagnosis not present

## 2018-02-25 DIAGNOSIS — D509 Iron deficiency anemia, unspecified: Secondary | ICD-10-CM | POA: Diagnosis not present

## 2018-02-25 DIAGNOSIS — N2581 Secondary hyperparathyroidism of renal origin: Secondary | ICD-10-CM | POA: Diagnosis not present

## 2018-02-25 DIAGNOSIS — D631 Anemia in chronic kidney disease: Secondary | ICD-10-CM | POA: Diagnosis not present

## 2018-02-25 DIAGNOSIS — Z992 Dependence on renal dialysis: Secondary | ICD-10-CM | POA: Diagnosis not present

## 2018-02-25 DIAGNOSIS — D689 Coagulation defect, unspecified: Secondary | ICD-10-CM | POA: Diagnosis not present

## 2018-02-25 DIAGNOSIS — N186 End stage renal disease: Secondary | ICD-10-CM | POA: Diagnosis not present

## 2018-02-25 DIAGNOSIS — E1129 Type 2 diabetes mellitus with other diabetic kidney complication: Secondary | ICD-10-CM | POA: Diagnosis not present

## 2018-02-26 DIAGNOSIS — R001 Bradycardia, unspecified: Secondary | ICD-10-CM | POA: Diagnosis not present

## 2018-02-26 DIAGNOSIS — E119 Type 2 diabetes mellitus without complications: Secondary | ICD-10-CM | POA: Diagnosis not present

## 2018-02-26 DIAGNOSIS — N261 Atrophy of kidney (terminal): Secondary | ICD-10-CM | POA: Diagnosis not present

## 2018-02-26 DIAGNOSIS — I10 Essential (primary) hypertension: Secondary | ICD-10-CM | POA: Diagnosis not present

## 2018-02-26 DIAGNOSIS — D631 Anemia in chronic kidney disease: Secondary | ICD-10-CM | POA: Diagnosis not present

## 2018-02-26 DIAGNOSIS — Z01818 Encounter for other preprocedural examination: Secondary | ICD-10-CM | POA: Diagnosis not present

## 2018-02-26 DIAGNOSIS — Z8542 Personal history of malignant neoplasm of other parts of uterus: Secondary | ICD-10-CM | POA: Diagnosis not present

## 2018-02-26 DIAGNOSIS — E1122 Type 2 diabetes mellitus with diabetic chronic kidney disease: Secondary | ICD-10-CM | POA: Diagnosis not present

## 2018-02-26 DIAGNOSIS — Z79899 Other long term (current) drug therapy: Secondary | ICD-10-CM | POA: Diagnosis not present

## 2018-02-26 DIAGNOSIS — Z992 Dependence on renal dialysis: Secondary | ICD-10-CM | POA: Diagnosis not present

## 2018-02-26 DIAGNOSIS — I120 Hypertensive chronic kidney disease with stage 5 chronic kidney disease or end stage renal disease: Secondary | ICD-10-CM | POA: Diagnosis not present

## 2018-02-26 DIAGNOSIS — Z01812 Encounter for preprocedural laboratory examination: Secondary | ICD-10-CM | POA: Diagnosis not present

## 2018-02-26 DIAGNOSIS — I2510 Atherosclerotic heart disease of native coronary artery without angina pectoris: Secondary | ICD-10-CM | POA: Diagnosis not present

## 2018-02-26 DIAGNOSIS — N186 End stage renal disease: Secondary | ICD-10-CM | POA: Diagnosis not present

## 2018-02-26 DIAGNOSIS — Z955 Presence of coronary angioplasty implant and graft: Secondary | ICD-10-CM | POA: Diagnosis not present

## 2018-02-26 DIAGNOSIS — Z6831 Body mass index (BMI) 31.0-31.9, adult: Secondary | ICD-10-CM | POA: Diagnosis not present

## 2018-02-26 DIAGNOSIS — E669 Obesity, unspecified: Secondary | ICD-10-CM | POA: Diagnosis not present

## 2018-03-02 NOTE — Progress Notes (Signed)
[facility] Note  03/03/2018     CHIEF COMPLAINT Patient presents for Retina Follow Up   HISTORY OF PRESENT ILLNESS: Kristy Santos is a 67 y.o. female who presents to the clinic today for:   HPI    Retina Follow Up    Patient presents with  Diabetic Retinopathy.  In both eyes.  This started 1 month ago.  Severity is mild.  Since onset it is gradually improving.  I, the attending physician,  performed the HPI with the patient and updated documentation appropriately.          Comments    F/U PDR OU. Patient states she feels her vision is "gradually getting better". Denies new visual onsets. Bs 200 this am, pt is taking cold medications that may alter BS values per patient.Pt is ready for tx today if indicted.       Last edited by Bernarda Caffey, MD on 03/03/2018 12:07 PM. (History)      Referring physician: No referring provider defined for this encounter.  HISTORICAL INFORMATION:   Selected notes from the MEDICAL RECORD NUMBER Referred by [name] for DM exam LEE:  Ocular Hx-pseudo OU PMH-DM (last A1C 6.4, taking Glyburide), heart disease, HTN    CURRENT MEDICATIONS: No current outpatient medications on file. (Ophthalmic Drugs)   No current facility-administered medications for this visit.  (Ophthalmic Drugs)   Current Outpatient Medications (Other)  Medication Sig  . amLODipine (NORVASC) 5 MG tablet Take 5 mg by mouth 2 (two) times daily.  Marland Kitchen aspirin EC 81 MG tablet Take 81 mg by mouth daily.  . calcium acetate, Phos Binder, (PHOSLYRA) 667 MG/5ML SOLN Take by mouth 3 (three) times daily with meals.  . calcium carbonate (OS-CAL) 1250 (500 Ca) MG chewable tablet Chew 1 tablet by mouth daily.  . cloNIDine (CATAPRES) 0.1 MG tablet Take 0.1 mg by mouth 2 (two) times daily.  . fish oil-omega-3 fatty acids 1000 MG capsule Take 1 g by mouth daily.  . furosemide (LASIX) 20 MG tablet Take 80 mg by mouth daily.   Marland Kitchen glyBURIDE (DIABETA)  5 MG tablet Take 10 mg by mouth 2 (two) times daily.  . metoprolol succinate (TOPROL-XL) 100 MG 24 hr tablet Take 50 mg by mouth at bedtime. Take with or immediately following a meal.   . Multiple Vitamin (MULTIVITAMIN WITH MINERALS) TABS Take 1 tablet by mouth daily.  Marland Kitchen oxyCODONE-acetaminophen (ROXICET) 5-325 MG tablet Take 1 tablet by mouth every 6 (six) hours as needed.  . sodium bicarbonate 325 MG tablet Take 325 mg by mouth 3 (three) times daily.    Current Facility-Administered Medications (Other)  Medication Route  . Bevacizumab (AVASTIN) SOLN 1.25 mg Intravitreal  . Bevacizumab (AVASTIN) SOLN 1.25 mg Intravitreal      REVIEW OF SYSTEMS: ROS    Positive for: Endocrine, Eyes   Negative for: Constitutional, Gastrointestinal, Neurological, Skin, Genitourinary, Musculoskeletal, HENT, Cardiovascular, Respiratory, Psychiatric, Allergic/Imm, Heme/Lymph   Last edited by Zenovia Jordan, LPN on 17/09/1023  8:52 AM. (History)       ALLERGIES Allergies  Allergen Reactions  . Bidil [Isosorb Dinitrate-Hydralazine] Shortness Of Breath    Chest pain, SOB    PAST MEDICAL HISTORY Past Medical History:  Diagnosis Date  . Chronic kidney disease   . Diabetes mellitus   . Hypertension    Past Surgical History:  Procedure Laterality Date  . AV FISTULA PLACEMENT Left 09/19/2015   Procedure: Creation of Left  Arm  Brachial Cephalic Arteriovenous Fistula;  Surgeon: Elam Dutch, MD;  Location: [facility];  Service: Vascular;  Laterality: Left;  . BREAST SURGERY  2009   right breast-benign  . CATARACT EXTRACTION    . COLONOSCOPY W/ BIOPSIES AND POLYPECTOMY    . ENDOMETRIAL BIOPSY  12/23/2011  . EYE SURGERY    . MULTIPLE TOOTH EXTRACTIONS    . REFRACTIVE SURGERY  2011   right eye  . TOTAL ABDOMINAL HYSTERECTOMY W/ BILATERAL SALPINGOOPHORECTOMY  01/21/2012   w/ LND    FAMILY HISTORY Family History  Problem Relation Age of Onset  . Cancer Mother   . Diabetes Father     SOCIAL  HISTORY Social History   Tobacco Use  . Smoking status: Never Smoker  . Smokeless tobacco: Never Used  . Tobacco comment: never used  Substance Use Topics  . Alcohol use: No  . Drug use: No         OPHTHALMIC EXAM:  Base Eye Exam    Visual Acuity (Snellen - Linear)      Right Left   Dist [place] 20/50 20/20 -2   Dist ph [place] 20/40 20/20 -1       Tonometry (Tonopen, 8:54 AM)      Right Left   Pressure 16 18       Pupils      Dark Light Shape React APD   Right 4 3 Round Brisk None   Left 4 3 Round Brisk None       Visual Fields (Counting fingers)      Left Right    Full Full       Extraocular Movement      Right Left    Full, Ortho Full, Ortho       Neuro/Psych    Oriented x3:  Yes   Mood/Affect:  Normal       Dilation    Both eyes:  1.0% Mydriacyl, 2.5% Phenylephrine @ 8:54 AM        Slit Lamp and Fundus Exam    Slit Lamp Exam      Right Left   Lids/Lashes Dermatochalasis - upper lid, mild Meibomian gland dysfunction Dermatochalasis - upper lid   Conjunctiva/Sclera Mild Melanosis, Nasal Pinguecula Melanosis, Temporal Pinguecula   Cornea Arcus, Temporal Well healed cataract wounds Arcus, Temporal Well healed cataract wounds   Anterior Chamber Deep and quiet Deep and quiet   Iris Round and dilated, No NVI Round and dilated, No NVI   Lens PC IOL in good position, trace PCO PC IOL in good position   Vitreous fibrosis along distal ST arcade, Vitreous syneresis Vitreous syneresis       Fundus Exam      Right Left   Disc Pink and Sharp, trace Pallor fibrosis overlying disc, Temporal Peripapillary atrophy   C/D Ratio 0.2 obscured   Macula Blunted foveal reflex scattered DBH, Superior Exudates, scattered focal laser scars, Epiretinal membrane, cluster of IRH and exudates temporal to fovea Blunted foveal reflex, Epiretinal membrane, scattered DBH, focal laser scars, small macroaneurysm along IT arcade   Vessels Vascular attenuation, Copper wiring, AV crossing  changes, mildly Tortuous, fibrosis along distal ST arcade Vascular attenuation, Copper wiring, AV crossing changes, mildly Tortuous, fibrosis overlying distal ST arcade   Periphery Attached, dense 360 PRP and good fill-in, scattered patches of fibrosis Attached, scattered pattern 360 PRP in place with good fill-in, scattered fibrosis          IMAGING AND PROCEDURES  Imaging and Procedures  for @TODAY @  OCT, Retina - OU - Both Eyes       Right Eye Quality was good. Central Foveal Thickness: 291. Progression has improved. Findings include abnormal foveal contour, no SRF, retinal drusen , outer retinal atrophy, intraretinal fluid (Mild interval improvement in IRF).   Left Eye Quality was good. Central Foveal Thickness: 218. Progression has been stable. Findings include normal foveal contour, no SRF, vitreous traction, intraretinal fluid (Trace cystic changes, diffuse atrophy, VMT along distal arcades with IRF).   Notes *Images captured and stored on drive  Diagnosis / Impression:  OD: irregular foveal contour and lamination; +IRF -- interval improvement, +DME OS: noncentral VMT (distal temporal arcades) with +IRF  Clinical management:  See below  Abbreviations: NFP - Normal foveal profile. CME - cystoid macular edema. PED - pigment epithelial detachment. IRF - intraretinal fluid. SRF - subretinal fluid. EZ - ellipsoid zone. ERM - epiretinal membrane. ORA - outer retinal atrophy. ORT - outer retinal tubulation. SRHM - subretinal hyper-reflective material         Intravitreal Injection, Pharmacologic Agent - OD - Right Eye       Time Out 03/03/2018. 10:28 AM. Confirmed correct patient, procedure, site, and patient consented.   Anesthesia Topical anesthesia was used. Anesthetic medications included Lidocaine 2%, Proparacaine 0.5%.   Procedure Preparation included 5% betadine to ocular surface, eyelid speculum. A supplied needle was used.   Injection:  1.25 mg Bevacizumab  1.25mg /0.78ml   NDC: 75643-329-51, Lot: [id number], Expiration date: 05/07/2018   Route: Intravitreal, Site: Right Eye, Waste: 0 mL  Post-op Post injection exam found visual acuity of at least counting fingers. The patient tolerated the procedure well. There were no complications. The patient received written and verbal post procedure care education.                 ASSESSMENT/PLAN:    ICD-10-CM   1. Proliferative diabetic retinopathy of both eyes with macular edema associated with type 2 diabetes mellitus (HCC) E11.3513 OCT, Retina - OU - Both Eyes    Intravitreal Injection, Pharmacologic Agent - OD - Right Eye    Bevacizumab (AVASTIN) SOLN 1.25 mg  2. Retinal edema H35.81 OCT, Retina - OU - Both Eyes  3. Essential hypertension I10   4. Hypertensive retinopathy of both eyes H35.033   5. Pseudophakia of both eyes Z96.1   6. Epiretinal membrane (ERM) of left eye H35.372   7. 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 Proliferative diabetic retinopathy w/ mild DME, OU - former pt of Dr. Baird Cancer and Dr. Elonda Husky  Records obtained from [facility]  Dr. Elonda Husky -- [facility] 2008-2009  Dr. Baird Cancer -- PPV, laser for [facility],  8.19.10    Focal laser 2.25.11 - exam shows preretinal fibrosis OU (OS > OD) - FA (8.13.19) with low signal, but shows late focal peripheral leakage OU - OCT with mild DME, both eyes (OD > OS)  - S/P IVA OD (09.10.19) - S/P PRP fill in OS (08.13.19)  - S/P PRP fill in OD (08.27.19) - mild interval improvement in DME OD - BCVA stable at 20/40 OD - recommend IVA OD #2 - pt wishes to proceed with IVA OD today (10.08.19) - RBA of procedure discussed, questions answered - informed consent obtained and signed - see procedure note - f/u 4 weeks for DFE/OCT/likely IVA OD  3,4. Hypertensive retinopathy OU - discussed importance of tight BP control - monitor  5. Pseudophakia OU  - s/p CE/IOL OU -- pt unable  to recall when and with who  - doing well  - monitor  6.  Epiretinal membrane, OS The natural history, anatomy, potential for loss of vision, and treatment options including vitrectomy techniques and the complications of endophthalmitis, retinal detachment, vitreous hemorrhage, cataract progression and permanent vision loss discussed with the patient. - mild ERM over macula - monitor  7. Preretinal fibrosis OU - OD: mild patches in periphery - OS: significant focal areas over disc and along distal temporal arcades -- causing traction and IRF - monitor for now -- may require surgical intervention / peeling eventually   Ophthalmic Meds Ordered this visit:  Meds ordered this encounter  Medications  . Bevacizumab (AVASTIN) SOLN 1.25 mg       Return in about 4 weeks (around 03/31/2018) for F/u PDR OU, DFE, OCT.  There are no Patient Instructions on file for this visit.   Explained the diagnoses, plan, and follow up with the patient and they expressed understanding.  Patient expressed understanding of the importance of proper follow up care.   This document serves as a record of services personally performed by Gardiner Sleeper, MD, PhD. It was created on their behalf by Ernest Mallick, OA, an ophthalmic assistant. The creation of this record is the provider's dictation and/or activities during the visit.    Electronically signed by: Ernest Mallick, OA  10.07.19 12:07 PM   This document serves as a record of services personally performed by Gardiner Sleeper, MD, PhD. It was created on their behalf by Catha Brow, [place], a certified ophthalmic assistant. The creation of this record is the provider's dictation and/or activities during the visit.  Electronically signed by: Catha Brow, [place]  10.08.19 12:07 PM   Gardiner Sleeper, M.D., Ph.D. Diseases & Surgery of the Retina and Vitreous [facility]   I have reviewed the above documentation for accuracy and completeness, and I agree with the above. Gardiner Sleeper, M.D.,  Ph.D. 03/03/18 12:10 PM    Abbreviations: M myopia (nearsighted); A astigmatism; H hyperopia (farsighted); P presbyopia; Mrx spectacle prescription;  CTL contact lenses; OD right eye; OS left eye; OU both eyes  XT exotropia; ET esotropia; PEK punctate epithelial keratitis; PEE punctate epithelial erosions; DES dry eye syndrome; MGD meibomian gland dysfunction; ATs artificial tears; PFAT's preservative free artificial tears; [place] nuclear sclerotic cataract; PSC posterior subcapsular cataract; ERM epi-retinal membrane; PVD posterior vitreous detachment; RD retinal detachment; DM diabetes mellitus; DR diabetic retinopathy; NPDR non-proliferative diabetic retinopathy; PDR proliferative diabetic retinopathy; CSME clinically significant macular edema; DME diabetic macular edema; dbh dot blot hemorrhages; CWS cotton wool spot; POAG primary open angle glaucoma; C/D cup-to-disc ratio; HVF humphrey visual field; GVF goldmann visual field; OCT optical coherence tomography; IOP intraocular pressure; BRVO Branch retinal vein occlusion; CRVO central retinal vein occlusion; CRAO central retinal artery occlusion; BRAO branch retinal artery occlusion; RT retinal tear; SB scleral buckle; PPV pars plana vitrectomy; VH Vitreous hemorrhage; PRP panretinal laser photocoagulation; IVK intravitreal kenalog; VMT vitreomacular traction; MH Macular hole;  NVD neovascularization of the disc; NVE neovascularization elsewhere; AREDS age related eye disease study; ARMD age related macular degeneration; POAG primary open angle glaucoma; EBMD epithelial/anterior basement membrane dystrophy; ACIOL anterior chamber intraocular lens; IOL intraocular lens; PCIOL posterior chamber intraocular lens; Phaco/IOL phacoemulsification with intraocular lens placement; [place] photorefractive keratectomy; LASIK laser assisted in situ keratomileusis; HTN hypertension; DM diabetes mellitus; COPD chronic obstructive pulmonary disease

## 2018-03-03 ENCOUNTER — Encounter (INDEPENDENT_AMBULATORY_CARE_PROVIDER_SITE_OTHER): Payer: Self-pay | Admitting: Ophthalmology

## 2018-03-03 ENCOUNTER — Ambulatory Visit (INDEPENDENT_AMBULATORY_CARE_PROVIDER_SITE_OTHER): Payer: Medicare HMO | Admitting: Ophthalmology

## 2018-03-03 DIAGNOSIS — E113513 Type 2 diabetes mellitus with proliferative diabetic retinopathy with macular edema, bilateral: Secondary | ICD-10-CM

## 2018-03-03 DIAGNOSIS — H3581 Retinal edema: Secondary | ICD-10-CM

## 2018-03-03 DIAGNOSIS — H35372 Puckering of macula, left eye: Secondary | ICD-10-CM

## 2018-03-03 DIAGNOSIS — Z961 Presence of intraocular lens: Secondary | ICD-10-CM

## 2018-03-03 DIAGNOSIS — I10 Essential (primary) hypertension: Secondary | ICD-10-CM

## 2018-03-03 DIAGNOSIS — H35373 Puckering of macula, bilateral: Secondary | ICD-10-CM

## 2018-03-03 DIAGNOSIS — H35033 Hypertensive retinopathy, bilateral: Secondary | ICD-10-CM | POA: Diagnosis not present

## 2018-03-03 MED ORDER — BEVACIZUMAB CHEMO INJECTION 1.25MG/0.05ML SYRINGE FOR KALEIDOSCOPE
1.2500 mg | INTRAVITREAL | Status: AC
  Administered 2018-03-03: 1.25 mg via INTRAVITREAL

## 2018-03-26 DIAGNOSIS — N186 End stage renal disease: Secondary | ICD-10-CM | POA: Diagnosis not present

## 2018-03-26 DIAGNOSIS — Z992 Dependence on renal dialysis: Secondary | ICD-10-CM | POA: Diagnosis not present

## 2018-03-26 DIAGNOSIS — E1129 Type 2 diabetes mellitus with other diabetic kidney complication: Secondary | ICD-10-CM | POA: Diagnosis not present

## 2018-03-27 DIAGNOSIS — D631 Anemia in chronic kidney disease: Secondary | ICD-10-CM | POA: Diagnosis not present

## 2018-03-27 DIAGNOSIS — D689 Coagulation defect, unspecified: Secondary | ICD-10-CM | POA: Diagnosis not present

## 2018-03-27 DIAGNOSIS — Z992 Dependence on renal dialysis: Secondary | ICD-10-CM | POA: Diagnosis not present

## 2018-03-27 DIAGNOSIS — N2581 Secondary hyperparathyroidism of renal origin: Secondary | ICD-10-CM | POA: Diagnosis not present

## 2018-03-27 DIAGNOSIS — D509 Iron deficiency anemia, unspecified: Secondary | ICD-10-CM | POA: Diagnosis not present

## 2018-03-27 DIAGNOSIS — N186 End stage renal disease: Secondary | ICD-10-CM | POA: Diagnosis not present

## 2018-03-27 DIAGNOSIS — E1129 Type 2 diabetes mellitus with other diabetic kidney complication: Secondary | ICD-10-CM | POA: Diagnosis not present

## 2018-03-30 NOTE — Progress Notes (Signed)
[facility] Note  03/31/2018     CHIEF COMPLAINT Patient presents for Retina Follow Up   HISTORY OF PRESENT ILLNESS: Kristy Santos is a 67 y.o. female who presents to the clinic today for:   HPI    Retina Follow Up    Patient presents with  Diabetic Retinopathy.  In both eyes.  This started 3 months ago.  Severity is mild.  Since onset it is gradually improving.  I, the attending physician,  performed the HPI with the patient and updated documentation appropriately.          Comments    F/U PDR AMD OU. Patient states her vision "is getting better", denies new visual onsets. Pt is ready for tx if indicted. Pt reports she has appt with Viona Gilmore Forrest  Thursday (04/02/18) for consultation of Kidney transplant.       Last edited by Bernarda Caffey, MD on 03/31/2018  8:59 AM. (History)    pt states she thinks her vision is about the same, she states she has a lot of tearing early in the morning, she denies any scratchiness or irritation  Referring physician: No referring provider defined for this encounter.  HISTORICAL INFORMATION:   Selected notes from the MEDICAL RECORD NUMBER Referred by [name] for DM exam LEE:  Ocular Hx-pseudo OU PMH-DM (last A1C 6.4, taking Glyburide), heart disease, HTN    CURRENT MEDICATIONS: No current outpatient medications on file. (Ophthalmic Drugs)   No current facility-administered medications for this visit.  (Ophthalmic Drugs)   Current Outpatient Medications (Other)  Medication Sig  . amLODipine (NORVASC) 5 MG tablet Take 5 mg by mouth 2 (two) times daily.  Marland Kitchen aspirin EC 81 MG tablet Take 81 mg by mouth daily.  . calcium acetate, Phos Binder, (PHOSLYRA) 667 MG/5ML SOLN Take 800 mg by mouth 3 (three) times daily with meals.   . calcium carbonate (OS-CAL) 1250 (500 Ca) MG chewable tablet Chew 1 tablet by mouth daily.  . cloNIDine (CATAPRES) 0.1 MG tablet Take 0.1 mg by mouth 2 (two) times daily.  .  fish oil-omega-3 fatty acids 1000 MG capsule Take 1 g by mouth daily.  . furosemide (LASIX) 20 MG tablet Take 80 mg by mouth daily.   Marland Kitchen glyBURIDE (DIABETA) 5 MG tablet Take 10 mg by mouth 2 (two) times daily.  . metoprolol succinate (TOPROL-XL) 100 MG 24 hr tablet Take 50 mg by mouth at bedtime. Take with or immediately following a meal.   . Multiple Vitamin (MULTIVITAMIN WITH MINERALS) TABS Take 1 tablet by mouth daily.  Marland Kitchen oxyCODONE-acetaminophen (ROXICET) 5-325 MG tablet Take 1 tablet by mouth every 6 (six) hours as needed.  . sodium bicarbonate 325 MG tablet Take 325 mg by mouth 3 (three) times daily.    Current Facility-Administered Medications (Other)  Medication Route  . Bevacizumab (AVASTIN) SOLN 1.25 mg Intravitreal  . Bevacizumab (AVASTIN) SOLN 1.25 mg Intravitreal  . Bevacizumab (AVASTIN) SOLN 1.25 mg Intravitreal      REVIEW OF SYSTEMS: ROS    Positive for: Endocrine, Eyes   Negative for: Constitutional, Gastrointestinal, Neurological, Skin, Genitourinary, Musculoskeletal, HENT, Cardiovascular, Respiratory, Psychiatric, Allergic/Imm, Heme/Lymph   Last edited by Zenovia Jordan, LPN on 30/12/6576  4:69 AM. (History)       ALLERGIES Allergies  Allergen Reactions  . Bidil [Isosorb Dinitrate-Hydralazine] Shortness Of Breath    Chest pain, SOB    PAST MEDICAL HISTORY Past Medical History:  Diagnosis Date  .  Chronic kidney disease   . Diabetes mellitus   . Hypertension    Past Surgical History:  Procedure Laterality Date  . AV FISTULA PLACEMENT Left 09/19/2015   Procedure: Creation of Left  Arm Brachial Cephalic Arteriovenous Fistula;  Surgeon: Elam Dutch, MD;  Location: [facility];  Service: Vascular;  Laterality: Left;  . BREAST SURGERY  2009   right breast-benign  . CATARACT EXTRACTION    . COLONOSCOPY W/ BIOPSIES AND POLYPECTOMY    . ENDOMETRIAL BIOPSY  12/23/2011  . EYE SURGERY    . MULTIPLE TOOTH EXTRACTIONS    . REFRACTIVE SURGERY  2011   right eye  .  TOTAL ABDOMINAL HYSTERECTOMY W/ BILATERAL SALPINGOOPHORECTOMY  01/21/2012   w/ LND    FAMILY HISTORY Family History  Problem Relation Age of Onset  . Cancer Mother   . Diabetes Father     SOCIAL HISTORY Social History   Tobacco Use  . Smoking status: Never Smoker  . Smokeless tobacco: Never Used  . Tobacco comment: never used  Substance Use Topics  . Alcohol use: No  . Drug use: No         OPHTHALMIC EXAM:  Base Eye Exam    Visual Acuity (Snellen - Linear)      Right Left   Dist [place] 20/50 -2 20/25 +2   Dist ph  20/40 -1 20/20 -2       Tonometry (Tonopen, 8:41 AM)      Right Left   Pressure 15 17       Pupils      Dark Light Shape React APD   Right 4 3 Round Brisk None   Left 4 3 Round Brisk None       Visual Fields (Counting fingers)      Left Right    Full Full       Extraocular Movement      Right Left    Full, Ortho Full, Ortho       Neuro/Psych    Oriented x3:  Yes   Mood/Affect:  Normal       Dilation    Both eyes:  1.0% Mydriacyl, 2.5% Phenylephrine @ 8:30 AM        Slit Lamp and Fundus Exam    Slit Lamp Exam      Right Left   Lids/Lashes Dermatochalasis - upper lid, mild Meibomian gland dysfunction Dermatochalasis - upper lid   Conjunctiva/Sclera Mild Melanosis, Nasal Pinguecula Melanosis, Temporal Pinguecula   Cornea Arcus, Temporal Well healed cataract wounds Arcus, Temporal Well healed cataract wounds   Anterior Chamber Deep and quiet Deep and quiet   Iris Round and dilated, No NVI Round and dilated, No NVI   Lens PC IOL in good position, trace PCO PC IOL in good position   Vitreous fibrosis along distal ST arcade, Vitreous syneresis Vitreous syneresis       Fundus Exam      Right Left   Disc Sharp, trace Pallor fibrosis overlying disc, Temporal Peripapillary atrophy   C/D Ratio 0.2 obscured   Macula Blunted foveal reflex scattered DBH, Superior Exudates - improved, scattered focal laser scars, Epiretinal membrane, cluster of  IRH and exudates temporal to fovea Blunted foveal reflex, Epiretinal membrane, scattered DBH, focal laser scars, small macroaneurysm along IT arcade   Vessels Vascular attenuation, Copper wiring, AV crossing changes, mildly Tortuous, fibrosis along distal ST arcade Vascular attenuation, Copper wiring, AV crossing changes, mildly Tortuous, fibrosis overlying distal ST arcade   Periphery  Attached, dense 360 PRP and good fill-in, scattered patches of fibrosis Attached, scattered pattern 360 PRP in place with good fill-in, scattered fibrosis - appears to be regressing          IMAGING AND PROCEDURES  Imaging and Procedures for @TODAY @  OCT, Retina - OU - Both Eyes       Right Eye Quality was good. Central Foveal Thickness: 295. Progression has been stable. Findings include abnormal foveal contour, no SRF, retinal drusen , outer retinal atrophy, intraretinal fluid (Trace interval improvement in IRF).   Left Eye Quality was good. Central Foveal Thickness: 218. Progression has been stable. Findings include normal foveal contour, no SRF, vitreous traction, intraretinal fluid, outer retinal atrophy (Trace cystic changes, diffuse atrophy, VMT along distal arcades with IRF, ?release of distal ST/vitreous traction).   Notes *Images captured and stored on drive  Diagnosis / Impression:  OD: irregular foveal contour and lamination; +IRF -- trace improvement, +DME OS: noncentral VMT (distal temporal arcades) with +IRF; ?release of distal ST vitreous traction  Clinical management:  See below  Abbreviations: NFP - Normal foveal profile. CME - cystoid macular edema. PED - pigment epithelial detachment. IRF - intraretinal fluid. SRF - subretinal fluid. EZ - ellipsoid zone. ERM - epiretinal membrane. ORA - outer retinal atrophy. ORT - outer retinal tubulation. SRHM - subretinal hyper-reflective material         Intravitreal Injection, Pharmacologic Agent - OD - Right Eye       Time  Out 03/31/2018. 9:00 AM. Confirmed correct patient, procedure, site, and patient consented.   Anesthesia Topical anesthesia was used. Anesthetic medications included Lidocaine 2%, Proparacaine 0.5%.   Procedure Preparation included 5% betadine to ocular surface, eyelid speculum. A 30 gauge needle was used.   Injection:  1.25 mg Bevacizumab 1.25mg /0.47ml   NDC: 50242-060-01, Lot: [id number] , Expiration date: 05/07/2018   Route: Intravitreal, Site: Right Eye, Waste: 0 mL  Post-op Post injection exam found visual acuity of at least counting fingers. The patient tolerated the procedure well. There were no complications. The patient received written and verbal post procedure care education.                 ASSESSMENT/PLAN:    ICD-10-CM   1. Proliferative diabetic retinopathy of both eyes with macular edema associated with type 2 diabetes mellitus (HCC) 25/04/2018 Intravitreal Injection, Pharmacologic Agent - OD - Right Eye    Bevacizumab (AVASTIN) SOLN 1.25 mg  2. Retinal edema H35.81 OCT, Retina - OU - Both Eyes  3. Essential hypertension I10   4. Hypertensive retinopathy of both eyes H35.033   5. Pseudophakia of both eyes Z96.1   6. Epiretinal membrane (ERM) of left eye H35.372   7. 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 Proliferative diabetic retinopathy w/ mild DME, OU - former pt of Dr. [id number] and Dr. Baird Cancer  Records obtained from [facility]  Dr. [facility] -- [facility] 2008-2009  Dr. 09-23-1998 -- PPV, laser for [facility],  8.19.10    Focal laser 2.25.11 - exam shows preretinal fibrosis OU (OS > OD) - FA (8.13.19) with low signal, but shows late focal peripheral leakage OU - OCT with mild DME, both eyes (OD > OS)  - S/P IVA OD (09.10.19), #2 (10.08.19) - S/P PRP fill in OS (08.13.19)  - S/P PRP fill in OD (08.27.19) - mild interval improvement in DME OD - BCVA stable at 20/40 OD - recommend IVA OD #3, 11.05.19 - pt wishes to proceed with IVA OD  today - RBA of procedure  discussed, questions answered - informed consent obtained and signed - see procedure note - consider switching medications if no improvement at next visit - f/u 4 weeks for DFE/OCT/possible injection  3,4. Hypertensive retinopathy OU - discussed importance of tight BP control - monitor  5. Pseudophakia OU  - s/p CE/IOL OU -- pt unable to recall when and with who  - doing well  - monitor  6. Epiretinal membrane, OS The natural history, anatomy, potential for loss of vision, and treatment options including vitrectomy techniques and the complications of endophthalmitis, retinal detachment, vitreous hemorrhage, cataract progression and permanent vision loss discussed with the patient. - mild ERM over macula - monitor  7. Preretinal fibrosis OU - OD: mild patches in periphery - OS: significant focal areas over disc and along distal temporal arcades -- causing traction and IRF - ?release of distal ST vitreous traction - monitor for now -- may require surgical intervention / peeling eventually   Ophthalmic Meds Ordered this visit:  Meds ordered this encounter  Medications  . Bevacizumab (AVASTIN) SOLN 1.25 mg       Return in about 4 weeks (around 04/28/2018) for F/U, PDR OU, DFE, OCT.  There are no Patient Instructions on file for this visit.   Explained the diagnoses, plan, and follow up with the patient and they expressed understanding.  Patient expressed understanding of the importance of proper follow up care.   This document serves as a record of services personally performed by Gardiner Sleeper, MD, PhD. It was created on their behalf by Ernest Mallick, OA, an ophthalmic assistant. The creation of this record is the provider's dictation and/or activities during the visit.    Electronically signed by: Ernest Mallick, OA  03/30/2018 11:18 AM    Gardiner Sleeper, M.D., Ph.D. Diseases & Surgery of the Retina and Vitreous [facility]   I have reviewed the  above documentation for accuracy and completeness, and I agree with the above. Gardiner Sleeper, M.D., Ph.D. 03/03/18 11:18 AM    Abbreviations: M myopia (nearsighted); A astigmatism; H hyperopia (farsighted); P presbyopia; Mrx spectacle prescription;  CTL contact lenses; OD right eye; OS left eye; OU both eyes  XT exotropia; ET esotropia; PEK punctate epithelial keratitis; PEE punctate epithelial erosions; DES dry eye syndrome; MGD meibomian gland dysfunction; ATs artificial tears; PFAT's preservative free artificial tears; [place] nuclear sclerotic cataract; PSC posterior subcapsular cataract; ERM epi-retinal membrane; PVD posterior vitreous detachment; RD retinal detachment; DM diabetes mellitus; DR diabetic retinopathy; NPDR non-proliferative diabetic retinopathy; PDR proliferative diabetic retinopathy; CSME clinically significant macular edema; DME diabetic macular edema; dbh dot blot hemorrhages; CWS cotton wool spot; POAG primary open angle glaucoma; C/D cup-to-disc ratio; HVF humphrey visual field; GVF goldmann visual field; OCT optical coherence tomography; IOP intraocular pressure; BRVO Branch retinal vein occlusion; CRVO central retinal vein occlusion; CRAO central retinal artery occlusion; BRAO branch retinal artery occlusion; RT retinal tear; SB scleral buckle; PPV pars plana vitrectomy; VH Vitreous hemorrhage; PRP panretinal laser photocoagulation; IVK intravitreal kenalog; VMT vitreomacular traction; MH Macular hole;  NVD neovascularization of the disc; NVE neovascularization elsewhere; AREDS age related eye disease study; ARMD age related macular degeneration; POAG primary open angle glaucoma; EBMD epithelial/anterior basement membrane dystrophy; ACIOL anterior chamber intraocular lens; IOL intraocular lens; PCIOL posterior chamber intraocular lens; Phaco/IOL phacoemulsification with intraocular lens placement; [place] photorefractive keratectomy; LASIK laser assisted in situ keratomileusis; HTN  hypertension; DM diabetes mellitus; COPD chronic obstructive pulmonary disease

## 2018-03-31 ENCOUNTER — Encounter (INDEPENDENT_AMBULATORY_CARE_PROVIDER_SITE_OTHER): Payer: Self-pay | Admitting: Ophthalmology

## 2018-03-31 ENCOUNTER — Ambulatory Visit (INDEPENDENT_AMBULATORY_CARE_PROVIDER_SITE_OTHER): Payer: Private Health Insurance - Indemnity | Admitting: Ophthalmology

## 2018-03-31 DIAGNOSIS — Z961 Presence of intraocular lens: Secondary | ICD-10-CM | POA: Diagnosis not present

## 2018-03-31 DIAGNOSIS — H3581 Retinal edema: Secondary | ICD-10-CM

## 2018-03-31 DIAGNOSIS — H35373 Puckering of macula, bilateral: Secondary | ICD-10-CM | POA: Diagnosis not present

## 2018-03-31 DIAGNOSIS — I10 Essential (primary) hypertension: Secondary | ICD-10-CM | POA: Diagnosis not present

## 2018-03-31 DIAGNOSIS — E113513 Type 2 diabetes mellitus with proliferative diabetic retinopathy with macular edema, bilateral: Secondary | ICD-10-CM

## 2018-03-31 DIAGNOSIS — H35372 Puckering of macula, left eye: Secondary | ICD-10-CM | POA: Diagnosis not present

## 2018-03-31 DIAGNOSIS — H35033 Hypertensive retinopathy, bilateral: Secondary | ICD-10-CM | POA: Diagnosis not present

## 2018-03-31 MED ORDER — BEVACIZUMAB CHEMO INJECTION 1.25MG/0.05ML SYRINGE FOR KALEIDOSCOPE
1.2500 mg | INTRAVITREAL | Status: AC
Start: 2018-03-31 — End: ?
  Administered 2018-03-31: 1.25 mg via INTRAVITREAL

## 2018-04-02 DIAGNOSIS — Z01818 Encounter for other preprocedural examination: Secondary | ICD-10-CM | POA: Diagnosis not present

## 2018-04-02 DIAGNOSIS — E0822 Diabetes mellitus due to underlying condition with diabetic chronic kidney disease: Secondary | ICD-10-CM | POA: Diagnosis not present

## 2018-04-02 DIAGNOSIS — I361 Nonrheumatic tricuspid (valve) insufficiency: Secondary | ICD-10-CM | POA: Diagnosis not present

## 2018-04-02 DIAGNOSIS — Z794 Long term (current) use of insulin: Secondary | ICD-10-CM | POA: Diagnosis not present

## 2018-04-02 DIAGNOSIS — E1140 Type 2 diabetes mellitus with diabetic neuropathy, unspecified: Secondary | ICD-10-CM | POA: Diagnosis not present

## 2018-04-02 DIAGNOSIS — E0840 Diabetes mellitus due to underlying condition with diabetic neuropathy, unspecified: Secondary | ICD-10-CM | POA: Diagnosis not present

## 2018-04-02 DIAGNOSIS — E1122 Type 2 diabetes mellitus with diabetic chronic kidney disease: Secondary | ICD-10-CM | POA: Diagnosis not present

## 2018-04-02 DIAGNOSIS — H4311 Vitreous hemorrhage, right eye: Secondary | ICD-10-CM | POA: Diagnosis not present

## 2018-04-02 DIAGNOSIS — Z992 Dependence on renal dialysis: Secondary | ICD-10-CM | POA: Diagnosis not present

## 2018-04-02 DIAGNOSIS — I340 Nonrheumatic mitral (valve) insufficiency: Secondary | ICD-10-CM | POA: Diagnosis not present

## 2018-04-02 DIAGNOSIS — N186 End stage renal disease: Secondary | ICD-10-CM | POA: Diagnosis not present

## 2018-04-02 DIAGNOSIS — I120 Hypertensive chronic kidney disease with stage 5 chronic kidney disease or end stage renal disease: Secondary | ICD-10-CM | POA: Diagnosis not present

## 2018-04-02 DIAGNOSIS — Z01810 Encounter for preprocedural cardiovascular examination: Secondary | ICD-10-CM | POA: Diagnosis not present

## 2018-04-02 DIAGNOSIS — E669 Obesity, unspecified: Secondary | ICD-10-CM | POA: Diagnosis not present

## 2018-04-02 DIAGNOSIS — Z7682 Awaiting organ transplant status: Secondary | ICD-10-CM | POA: Diagnosis not present

## 2018-04-02 DIAGNOSIS — Z6835 Body mass index (BMI) 35.0-35.9, adult: Secondary | ICD-10-CM | POA: Diagnosis not present

## 2018-04-02 DIAGNOSIS — I081 Rheumatic disorders of both mitral and tricuspid valves: Secondary | ICD-10-CM | POA: Diagnosis not present

## 2018-04-25 DIAGNOSIS — N186 End stage renal disease: Secondary | ICD-10-CM | POA: Diagnosis not present

## 2018-04-25 DIAGNOSIS — E1129 Type 2 diabetes mellitus with other diabetic kidney complication: Secondary | ICD-10-CM | POA: Diagnosis not present

## 2018-04-25 DIAGNOSIS — Z992 Dependence on renal dialysis: Secondary | ICD-10-CM | POA: Diagnosis not present

## 2018-04-27 DIAGNOSIS — E1129 Type 2 diabetes mellitus with other diabetic kidney complication: Secondary | ICD-10-CM | POA: Diagnosis not present

## 2018-04-27 DIAGNOSIS — N2581 Secondary hyperparathyroidism of renal origin: Secondary | ICD-10-CM | POA: Diagnosis not present

## 2018-04-27 DIAGNOSIS — N186 End stage renal disease: Secondary | ICD-10-CM | POA: Diagnosis not present

## 2018-04-27 DIAGNOSIS — D631 Anemia in chronic kidney disease: Secondary | ICD-10-CM | POA: Diagnosis not present

## 2018-04-27 DIAGNOSIS — D689 Coagulation defect, unspecified: Secondary | ICD-10-CM | POA: Diagnosis not present

## 2018-04-27 DIAGNOSIS — D509 Iron deficiency anemia, unspecified: Secondary | ICD-10-CM | POA: Diagnosis not present

## 2018-04-27 DIAGNOSIS — Z992 Dependence on renal dialysis: Secondary | ICD-10-CM | POA: Diagnosis not present

## 2018-04-27 NOTE — Progress Notes (Signed)
[facility] Note  04/28/2018     CHIEF COMPLAINT Patient presents for Retina Follow Up   HISTORY OF PRESENT ILLNESS: Kristy Santos is a 67 y.o. female who presents to the clinic today for:   HPI    Retina Follow Up    Patient presents with  Diabetic Retinopathy.  In both eyes.  This started years ago.  Severity is moderate.  Duration of 4 weeks.  Since onset it is stable.  I, the attending physician,  performed the HPI with the patient and updated documentation appropriately.          Comments    67 y/o female pt here for 4 wk f/u for PDR OU.  No change in [place] OU.  Denies pain, flashes, flloaters.  No gtts.  BS 179 this a.m.  Last A1C was 7 or 8, about 1 month ago.       Last edited by Bernarda Caffey, MD on 04/28/2018  8:55 AM. (History)      Referring physician: No referring provider defined for this encounter.  HISTORICAL INFORMATION:   Selected notes from the MEDICAL RECORD NUMBER Referred by [name] for DM exam LEE:  Ocular Hx-pseudo OU PMH-DM (last A1C 6.4, taking Glyburide), heart disease, HTN    CURRENT MEDICATIONS: No current outpatient medications on file. (Ophthalmic Drugs)   No current facility-administered medications for this visit.  (Ophthalmic Drugs)   Current Outpatient Medications (Other)  Medication Sig  . amLODipine (NORVASC) 5 MG tablet Take 5 mg by mouth 2 (two) times daily.  Marland Kitchen aspirin EC 81 MG tablet Take 81 mg by mouth daily.  . calcium acetate, Phos Binder, (PHOSLYRA) 667 MG/5ML SOLN Take 800 mg by mouth 3 (three) times daily with meals.   . calcium carbonate (OS-CAL) 1250 (500 Ca) MG chewable tablet Chew 1 tablet by mouth daily.  . cloNIDine (CATAPRES) 0.1 MG tablet Take 0.1 mg by mouth 2 (two) times daily.  . fish oil-omega-3 fatty acids 1000 MG capsule Take 1 g by mouth daily.  . furosemide (LASIX) 20 MG tablet Take 80 mg by mouth daily.   Marland Kitchen glyBURIDE (DIABETA) 5 MG tablet Take 10 mg by mouth 2  (two) times daily.  . metoprolol succinate (TOPROL-XL) 100 MG 24 hr tablet Take 50 mg by mouth at bedtime. Take with or immediately following a meal.   . Multiple Vitamin (MULTIVITAMIN WITH MINERALS) TABS Take 1 tablet by mouth daily.  Marland Kitchen oxyCODONE-acetaminophen (ROXICET) 5-325 MG tablet Take 1 tablet by mouth every 6 (six) hours as needed.  . sodium bicarbonate 325 MG tablet Take 325 mg by mouth 3 (three) times daily.    Current Facility-Administered Medications (Other)  Medication Route  . Bevacizumab (AVASTIN) SOLN 1.25 mg Intravitreal  . Bevacizumab (AVASTIN) SOLN 1.25 mg Intravitreal  . Bevacizumab (AVASTIN) SOLN 1.25 mg Intravitreal  . Bevacizumab (AVASTIN) SOLN 1.25 mg Intravitreal      REVIEW OF SYSTEMS: ROS    Positive for: Endocrine, Eyes   Negative for: Constitutional, Gastrointestinal, Neurological, Skin, Genitourinary, Musculoskeletal, HENT, Cardiovascular, Respiratory, Psychiatric, Allergic/Imm, Heme/Lymph   Last edited by Matthew Folks, COA on 04/28/2018  7:58 AM. (History)       ALLERGIES Allergies  Allergen Reactions  . Bidil [Isosorb Dinitrate-Hydralazine] Shortness Of Breath    Chest pain, SOB    PAST MEDICAL HISTORY Past Medical History:  Diagnosis Date  . Chronic kidney disease   . Diabetes mellitus   .  Hypertension    Past Surgical History:  Procedure Laterality Date  . AV FISTULA PLACEMENT Left 09/19/2015   Procedure: Creation of Left  Arm Brachial Cephalic Arteriovenous Fistula;  Surgeon: Elam Dutch, MD;  Location: [facility];  Service: Vascular;  Laterality: Left;  . BREAST SURGERY  2009   right breast-benign  . CATARACT EXTRACTION    . COLONOSCOPY W/ BIOPSIES AND POLYPECTOMY    . ENDOMETRIAL BIOPSY  12/23/2011  . EYE SURGERY    . MULTIPLE TOOTH EXTRACTIONS    . REFRACTIVE SURGERY  2011   right eye  . TOTAL ABDOMINAL HYSTERECTOMY W/ BILATERAL SALPINGOOPHORECTOMY  01/21/2012   w/ LND    FAMILY HISTORY Family History  Problem Relation  Age of Onset  . Cancer Mother   . Diabetes Father     SOCIAL HISTORY Social History   Tobacco Use  . Smoking status: Never Smoker  . Smokeless tobacco: Never Used  . Tobacco comment: never used  Substance Use Topics  . Alcohol use: No  . Drug use: No         OPHTHALMIC EXAM:  Base Eye Exam    Visual Acuity (Snellen - Linear)      Right Left   Dist [place] 20/40 - 20/20 -   Dist ph [place] 20/30 +        Tonometry (Tonopen, 8:05 AM)      Right Left   Pressure 18 16       Pupils      Dark Light Shape React APD   Right 4 3 Round Brisk None   Left 4 3 Round Brisk None       Visual Fields (Counting fingers)      Left Right    Full Full       Extraocular Movement      Right Left    Full, Ortho Full, Ortho       Neuro/Psych    Oriented x3:  Yes   Mood/Affect:  Normal       Dilation    Both eyes:  1.0% Mydriacyl, 2.5% Phenylephrine @ 8:10 AM        Slit Lamp and Fundus Exam    Slit Lamp Exam      Right Left   Lids/Lashes Dermatochalasis - upper lid, mild Meibomian gland dysfunction Dermatochalasis - upper lid   Conjunctiva/Sclera Mild Melanosis, Nasal Pinguecula Melanosis, Temporal Pinguecula   Cornea Arcus, Temporal Well healed cataract wounds Arcus, Temporal Well healed cataract wounds   Anterior Chamber Deep and quiet Deep and quiet   Iris Round and dilated, No NVI Round and dilated, No NVI   Lens PC IOL in good position, trace PCO PC IOL in good position   Vitreous fibrosis along distal ST arcade, Vitreous syneresis Vitreous syneresis       Fundus Exam      Right Left   Disc Sharp, trace Pallor fibrosis overlying disc, Temporal Peripapillary atrophy   C/D Ratio 0.2 obscured   Macula Blunted foveal reflex, +residual edema, scattered DBH, Superior Exudates - improved, scattered focal laser scars, Epiretinal membrane, cluster of IRH and exudates temporal to fovea, Retinal pigment epithelial mottling Blunted foveal reflex, Epiretinal membrane, scattered DBH,  focal laser scars, small macroaneurysm along IT arcade   Vessels Vascular attenuation, Copper wiring, AV crossing changes, mildly Tortuous, fibrosis along distal ST arcade -- stable, ?regressing Vascular attenuation, Copper wiring, AV crossing changes, mildly Tortuous, fibrosis overlying distal ST arcade   Periphery Attached, dense 360  PRP and good fill-in, scattered patches of fibrosis Attached, scattered pattern 360 PRP in place with good fill-in, scattered fibrosis - appears to be regressing          IMAGING AND PROCEDURES  Imaging and Procedures for @TODAY @  OCT, Retina - OU - Both Eyes       Right Eye Quality was good. Central Foveal Thickness: 290. Progression has improved. Findings include abnormal foveal contour, no SRF, retinal drusen , outer retinal atrophy, intraretinal fluid (Trace interval improvement in IRF).   Left Eye Quality was good. Central Foveal Thickness: 215. Progression has improved. Findings include normal foveal contour, no SRF, vitreous traction, outer retinal atrophy, no IRF (Interval improvement in Trace cystic changes).   Notes *Images captured and stored on drive  Diagnosis / Impression:  OD: irregular foveal contour and lamination; +IRF -- trace improvement, +DME OS: mild atrophy; interval improvement in tr cystic changes  Clinical management:  See below  Abbreviations: NFP - Normal foveal profile. CME - cystoid macular edema. PED - pigment epithelial detachment. IRF - intraretinal fluid. SRF - subretinal fluid. EZ - ellipsoid zone. ERM - epiretinal membrane. ORA - outer retinal atrophy. ORT - outer retinal tubulation. SRHM - subretinal hyper-reflective material         Intravitreal Injection, Pharmacologic Agent - OD - Right Eye       Time Out 04/28/2018. 8:47 AM. Confirmed correct patient, procedure, site, and patient consented.   Anesthesia Topical anesthesia was used. Anesthetic medications included Lidocaine 2%, Proparacaine 0.5%.    Procedure Preparation included 5% betadine to ocular surface, eyelid speculum. A 30 gauge needle was used.   Injection:  1.25 mg Bevacizumab 1.25mg /0.48ml   NDC: 63893-734-28, Lot: [id number] , Expiration date: 06/11/2018   Route: Intravitreal, Site: Right Eye, Waste: 0 mL  Post-op Post injection exam found visual acuity of at least counting fingers. The patient tolerated the procedure well. There were no complications. The patient received written and verbal post procedure care education.                 ASSESSMENT/PLAN:    ICD-10-CM   1. Proliferative diabetic retinopathy of both eyes with macular edema associated with type 2 diabetes mellitus (HCC) 06/24/2018 Intravitreal Injection, Pharmacologic Agent - OD - Right Eye    Bevacizumab (AVASTIN) SOLN 1.25 mg  2. Retinal edema H35.81 OCT, Retina - OU - Both Eyes  3. Essential hypertension I10   4. Hypertensive retinopathy of both eyes H35.033   5. Pseudophakia of both eyes Z96.1   6. Epiretinal membrane (ERM) of left eye H35.372   7. 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 Proliferative diabetic retinopathy w/ mild DME, OU - former pt of Dr. [id number] and Dr. Baird Cancer  Records obtained from [facility]  Dr. [facility] -- [facility] 2008-2009  Dr. 09-23-1998 -- PPV, laser for [facility],  8.19.10    Focal laser 2.25.11 - exam shows preretinal fibrosis OU (OS > OD) - FA (8.13.19) with low signal, but shows late focal peripheral leakage OU - OCT with DME OU - S/P IVA OD (09.10.19), #2 (10.08.19), #3 (11.05.19) - S/P PRP fill in OS (08.13.19)  - S/P PRP fill in OD (08.27.19) - mild interval improvement in DME OD; cystic changes in OS resolved today - BCVA improved to 20/30+ from 20/40 OD - recommend IVA OD #4, 12.03.19 - pt wishes to proceed with IVA OD today - RBA of procedure discussed, questions answered - informed consent obtained and signed - see  procedure note - consider switching medications if no improvement at next visit -  f/u 4 weeks for DFE/OCT/possible injection  3,4. Hypertensive retinopathy OU - discussed importance of tight BP control - monitor  5. Pseudophakia OU  - s/p CE/IOL OU -- pt unable to recall when and with who  - doing well  - monitor  6. Epiretinal membrane, OS The natural history, anatomy, potential for loss of vision, and treatment options including vitrectomy techniques and the complications of endophthalmitis, retinal detachment, vitreous hemorrhage, cataract progression and permanent vision loss discussed with the patient. - mild ERM over macula - monitor  7. Preretinal fibrosis OU - OD: mild patches in periphery - OS: significant focal areas over disc and along distal temporal arcades -- causing traction and IRF - ?release of distal ST vitreous traction - monitor for now -- may require surgical intervention / peeling eventually   Ophthalmic Meds Ordered this visit:  Meds ordered this encounter  Medications  . Bevacizumab (AVASTIN) SOLN 1.25 mg       Return in about 4 weeks (around 05/26/2018) for F/U Jan 2, PDR, DFE, OCT.  There are no Patient Instructions on file for this visit.   Explained the diagnoses, plan, and follow up with the patient and they expressed understanding.  Patient expressed understanding of the importance of proper follow up care.   This document serves as a record of services personally performed by Gardiner Sleeper, MD, PhD. It was created on their behalf by Ernest Mallick, OA, an ophthalmic assistant. The creation of this record is the provider's dictation and/or activities during the visit.    Electronically signed by: Ernest Mallick, OA 12.02.19 8:55 AM     Gardiner Sleeper, M.D., Ph.D. Diseases & Surgery of the Retina and Vitreous [facility]   I have reviewed the above documentation for accuracy and completeness, and I agree with the above. Gardiner Sleeper, M.D., Ph.D. 04/28/18 8:57 AM     Abbreviations: M myopia  (nearsighted); A astigmatism; H hyperopia (farsighted); P presbyopia; Mrx spectacle prescription;  CTL contact lenses; OD right eye; OS left eye; OU both eyes  XT exotropia; ET esotropia; PEK punctate epithelial keratitis; PEE punctate epithelial erosions; DES dry eye syndrome; MGD meibomian gland dysfunction; ATs artificial tears; PFAT's preservative free artificial tears; [place] nuclear sclerotic cataract; PSC posterior subcapsular cataract; ERM epi-retinal membrane; PVD posterior vitreous detachment; RD retinal detachment; DM diabetes mellitus; DR diabetic retinopathy; NPDR non-proliferative diabetic retinopathy; PDR proliferative diabetic retinopathy; CSME clinically significant macular edema; DME diabetic macular edema; dbh dot blot hemorrhages; CWS cotton wool spot; POAG primary open angle glaucoma; C/D cup-to-disc ratio; HVF humphrey visual field; GVF goldmann visual field; OCT optical coherence tomography; IOP intraocular pressure; BRVO Branch retinal vein occlusion; CRVO central retinal vein occlusion; CRAO central retinal artery occlusion; BRAO branch retinal artery occlusion; RT retinal tear; SB scleral buckle; PPV pars plana vitrectomy; VH Vitreous hemorrhage; PRP panretinal laser photocoagulation; IVK intravitreal kenalog; VMT vitreomacular traction; MH Macular hole;  NVD neovascularization of the disc; NVE neovascularization elsewhere; AREDS age related eye disease study; ARMD age related macular degeneration; POAG primary open angle glaucoma; EBMD epithelial/anterior basement membrane dystrophy; ACIOL anterior chamber intraocular lens; IOL intraocular lens; PCIOL posterior chamber intraocular lens; Phaco/IOL phacoemulsification with intraocular lens placement; [place] photorefractive keratectomy; LASIK laser assisted in situ keratomileusis; HTN hypertension; DM diabetes mellitus; COPD chronic obstructive pulmonary disease

## 2018-04-28 ENCOUNTER — Ambulatory Visit (INDEPENDENT_AMBULATORY_CARE_PROVIDER_SITE_OTHER): Payer: Medicare HMO | Admitting: Ophthalmology

## 2018-04-28 ENCOUNTER — Encounter (INDEPENDENT_AMBULATORY_CARE_PROVIDER_SITE_OTHER): Payer: Self-pay | Admitting: Ophthalmology

## 2018-04-28 DIAGNOSIS — H3581 Retinal edema: Secondary | ICD-10-CM

## 2018-04-28 DIAGNOSIS — I10 Essential (primary) hypertension: Secondary | ICD-10-CM

## 2018-04-28 DIAGNOSIS — Z961 Presence of intraocular lens: Secondary | ICD-10-CM

## 2018-04-28 DIAGNOSIS — E113513 Type 2 diabetes mellitus with proliferative diabetic retinopathy with macular edema, bilateral: Secondary | ICD-10-CM

## 2018-04-28 DIAGNOSIS — H35372 Puckering of macula, left eye: Secondary | ICD-10-CM | POA: Diagnosis not present

## 2018-04-28 DIAGNOSIS — H35033 Hypertensive retinopathy, bilateral: Secondary | ICD-10-CM | POA: Diagnosis not present

## 2018-04-28 DIAGNOSIS — H35373 Puckering of macula, bilateral: Secondary | ICD-10-CM

## 2018-04-28 MED ORDER — BEVACIZUMAB CHEMO INJECTION 1.25MG/0.05ML SYRINGE FOR KALEIDOSCOPE
1.2500 mg | INTRAVITREAL | Status: AC
Start: 2018-04-28 — End: ?
  Administered 2018-04-28: 1.25 mg via INTRAVITREAL

## 2018-05-26 DIAGNOSIS — Z992 Dependence on renal dialysis: Secondary | ICD-10-CM | POA: Diagnosis not present

## 2018-05-26 DIAGNOSIS — E1129 Type 2 diabetes mellitus with other diabetic kidney complication: Secondary | ICD-10-CM | POA: Diagnosis not present

## 2018-05-26 DIAGNOSIS — N186 End stage renal disease: Secondary | ICD-10-CM | POA: Diagnosis not present

## 2018-05-29 DIAGNOSIS — N2581 Secondary hyperparathyroidism of renal origin: Secondary | ICD-10-CM | POA: Diagnosis not present

## 2018-05-29 DIAGNOSIS — Z992 Dependence on renal dialysis: Secondary | ICD-10-CM | POA: Diagnosis not present

## 2018-05-29 DIAGNOSIS — N186 End stage renal disease: Secondary | ICD-10-CM | POA: Diagnosis not present

## 2018-05-29 DIAGNOSIS — D689 Coagulation defect, unspecified: Secondary | ICD-10-CM | POA: Diagnosis not present

## 2018-05-29 DIAGNOSIS — D631 Anemia in chronic kidney disease: Secondary | ICD-10-CM | POA: Diagnosis not present

## 2018-05-29 DIAGNOSIS — E1129 Type 2 diabetes mellitus with other diabetic kidney complication: Secondary | ICD-10-CM | POA: Diagnosis not present

## 2018-06-02 NOTE — Progress Notes (Signed)
[facility] Note  06/04/2018     CHIEF COMPLAINT Patient presents for Retina Follow Up   HISTORY OF PRESENT ILLNESS: Kristy Santos is a 68 y.o. female who presents to the clinic today for:   HPI    Retina Follow Up    Patient presents with  Diabetic Retinopathy.  In both eyes.  Severity is moderate.  Duration of 4 weeks.  Since onset it is stable.  I, the attending physician,  performed the HPI with the patient and updated documentation appropriately.          Comments    4 week retina follow up for PDR, Pt states vision is stable. Blood Sugar today 126       Last edited by Bernarda Caffey, MD on 06/04/2018  8:17 AM. (History)    pt states her vision is stable, she states she has seen some black floaters in her right eye  Referring physician: [name], MD [place], [place] [id number]  HISTORICAL INFORMATION:   Selected notes from the MEDICAL RECORD NUMBER Referred by [name] for DM exam LEE:  Ocular Hx-pseudo OU PMH-DM (last A1C 6.4, taking Glyburide), heart disease, HTN    CURRENT MEDICATIONS: No current outpatient medications on file. (Ophthalmic Drugs)   No current facility-administered medications for this visit.  (Ophthalmic Drugs)   Current Outpatient Medications (Other)  Medication Sig  . amLODipine (NORVASC) 5 MG tablet Take 5 mg by mouth 2 (two) times daily.  Marland Kitchen aspirin EC 81 MG tablet Take 81 mg by mouth daily.  . calcium acetate, Phos Binder, (PHOSLYRA) 667 MG/5ML SOLN Take 800 mg by mouth 3 (three) times daily with meals.   . calcium carbonate (OS-CAL) 1250 (500 Ca) MG chewable tablet Chew 1 tablet by mouth daily.  . cloNIDine (CATAPRES) 0.1 MG tablet Take 0.1 mg by mouth 2 (two) times daily.  . fish oil-omega-3 fatty acids 1000 MG capsule Take 1 g by mouth daily.  . furosemide (LASIX) 20 MG tablet Take 80 mg by mouth daily.   Marland Kitchen glyBURIDE (DIABETA) 5 MG tablet Take 10 mg by mouth 2 (two) times  daily.  . metoprolol succinate (TOPROL-XL) 100 MG 24 hr tablet Take 50 mg by mouth at bedtime. Take with or immediately following a meal.   . Multiple Vitamin (MULTIVITAMIN WITH MINERALS) TABS Take 1 tablet by mouth daily.  Marland Kitchen oxyCODONE-acetaminophen (ROXICET) 5-325 MG tablet Take 1 tablet by mouth every 6 (six) hours as needed.  . sodium bicarbonate 325 MG tablet Take 325 mg by mouth 3 (three) times daily.    Current Facility-Administered Medications (Other)  Medication Route  . Bevacizumab (AVASTIN) SOLN 1.25 mg Intravitreal  . Bevacizumab (AVASTIN) SOLN 1.25 mg Intravitreal  . Bevacizumab (AVASTIN) SOLN 1.25 mg Intravitreal  . Bevacizumab (AVASTIN) SOLN 1.25 mg Intravitreal  . Bevacizumab (AVASTIN) SOLN 1.25 mg Intravitreal      REVIEW OF SYSTEMS: ROS    Positive for: Endocrine, Eyes   Negative for: Constitutional, Gastrointestinal, Neurological, Skin, Genitourinary, Musculoskeletal, HENT, Cardiovascular, Respiratory, Psychiatric, Allergic/Imm, Heme/Lymph   Last edited by Elmore Guise on 06/04/2018  7:53 AM. (History)       ALLERGIES Allergies  Allergen Reactions  . Bidil [Isosorb Dinitrate-Hydralazine] Shortness Of Breath    Chest pain, SOB    PAST MEDICAL HISTORY Past Medical History:  Diagnosis Date  . Chronic kidney disease   . Diabetes mellitus   . Hypertension    Past  Surgical History:  Procedure Laterality Date  . AV FISTULA PLACEMENT Left 09/19/2015   Procedure: Creation of Left  Arm Brachial Cephalic Arteriovenous Fistula;  Surgeon: Elam Dutch, MD;  Location: [facility];  Service: Vascular;  Laterality: Left;  . BREAST SURGERY  2009   right breast-benign  . CATARACT EXTRACTION    . COLONOSCOPY W/ BIOPSIES AND POLYPECTOMY    . ENDOMETRIAL BIOPSY  12/23/2011  . EYE SURGERY    . MULTIPLE TOOTH EXTRACTIONS    . REFRACTIVE SURGERY  2011   right eye  . TOTAL ABDOMINAL HYSTERECTOMY W/ BILATERAL SALPINGOOPHORECTOMY  01/21/2012   w/ LND    FAMILY  HISTORY Family History  Problem Relation Age of Onset  . Cancer Mother   . Diabetes Father     SOCIAL HISTORY Social History   Tobacco Use  . Smoking status: Never Smoker  . Smokeless tobacco: Never Used  . Tobacco comment: never used  Substance Use Topics  . Alcohol use: No  . Drug use: No         OPHTHALMIC EXAM:  Base Eye Exam    Visual Acuity (Snellen - Linear)      Right Left   Dist [place] 20/30-2 20/20-2   Dist ph [place] 20/NI 20/20-1       Tonometry (Tonopen, 7:59 AM)      Right Left   Pressure 18 22       Pupils      Dark Light Shape React APD   Right 4 3 Round Brisk None   Left 4 3 Round Brisk None       Visual Fields (Counting fingers)      Left Right    Full Full       Extraocular Movement      Right Left    Full, Ortho Full, Ortho       Neuro/Psych    Oriented x3:  Yes   Mood/Affect:  Normal       Dilation    Both eyes:  1.0% Mydriacyl, 2.5% Phenylephrine @ 7:59 AM        Slit Lamp and Fundus Exam    Slit Lamp Exam      Right Left   Lids/Lashes Dermatochalasis - upper lid, mild Meibomian gland dysfunction Dermatochalasis - upper lid   Conjunctiva/Sclera Mild Melanosis, Nasal Pinguecula Melanosis, Temporal Pinguecula   Cornea Arcus, Temporal Well healed cataract wounds Arcus, Temporal Well healed cataract wounds   Anterior Chamber Deep and quiet Deep and quiet   Iris Round and dilated, No NVI Round and dilated, No NVI   Lens PC IOL in good position, trace PCO PC IOL in good position   Vitreous fibrosis along distal ST arcade, Vitreous syneresis Vitreous syneresis       Fundus Exam      Right Left   Disc Sharp, trace Pallor fibrosis overlying disc, Temporal Peripapillary atrophy   C/D Ratio 0.2 obscured   Macula Blunted foveal reflex, +residual edema, scattered DBH, Superior Exudates - improved, scattered focal laser scars, Epiretinal membrane, cluster of IRH and exudates temporal to fovea -- improved, Retinal pigment epithelial mottling  Blunted foveal reflex, Epiretinal membrane, scattered DBH, focal laser scars, small macroaneurysm along IT arcade   Vessels Vascular attenuation, Copper wiring, AV crossing changes, mildly Tortuous, fibrosis along distal ST arcade -- stable, ?regressing Vascular attenuation, Copper wiring, AV crossing changes, mildly Tortuous, fibrosis overlying distal ST arcade, ?release IT arcades   Periphery Attached, dense 360 PRP and good  fill-in, scattered patches of fibrosis Attached, scattered pattern 360 PRP in place with good fill-in, scattered fibrosis - appears to be regressing          IMAGING AND PROCEDURES  Imaging and Procedures for @TODAY @  OCT, Retina - OU - Both Eyes       Right Eye Quality was good. Central Foveal Thickness: 294. Progression has been stable. Findings include abnormal foveal contour, no SRF, retinal drusen , outer retinal atrophy, intraretinal fluid (Mild interval improvement in IRF).   Left Eye Quality was good. Central Foveal Thickness: 216. Progression has improved. Findings include normal foveal contour, no SRF, outer retinal atrophy, no IRF (No significant change from prior, ?inteval release in vitreous traction).   Notes *Images captured and stored on drive  Diagnosis / Impression:  OD: irregular foveal contour and lamination; +IRF -- trace improvement, +DME OS: mild atrophy; interval release of vitreous traction along IT arcades  Clinical management:  See below  Abbreviations: NFP - Normal foveal profile. CME - cystoid macular edema. PED - pigment epithelial detachment. IRF - intraretinal fluid. SRF - subretinal fluid. EZ - ellipsoid zone. ERM - epiretinal membrane. ORA - outer retinal atrophy. ORT - outer retinal tubulation. SRHM - subretinal hyper-reflective material         Intravitreal Injection, Pharmacologic Agent - OD - Right Eye       Time Out 06/04/2018. 8:48 AM. Confirmed correct patient, procedure, site, and patient consented.    Anesthesia Topical anesthesia was used. Anesthetic medications included Lidocaine 2%, Proparacaine 0.5%.   Procedure Preparation included 5% betadine to ocular surface, eyelid speculum. A supplied needle was used.   Injection:  1.25 mg Bevacizumab (AVASTIN) SOLN   NDC: 24268-341-96, Lot: [id number] , Expiration date: 07/20/2018   Route: Intravitreal, Site: Right Eye, Waste: 0 mL  Post-op Post injection exam found visual acuity of at least counting fingers. The patient tolerated the procedure well. There were no complications. The patient received written and verbal post procedure care education.                 ASSESSMENT/PLAN:    ICD-10-CM   1. Proliferative diabetic retinopathy of both eyes with macular edema associated with type 2 diabetes mellitus (HCC) 08/02/2018 Intravitreal Injection, Pharmacologic Agent - OD - Right Eye    Bevacizumab (AVASTIN) SOLN 1.25 mg  2. Retinal edema H35.81 OCT, Retina - OU - Both Eyes  3. Essential hypertension I10   4. Hypertensive retinopathy of both eyes H35.033   5. Pseudophakia of both eyes Z96.1   6. Epiretinal membrane (ERM) of left eye H35.372   7. 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 Proliferative diabetic retinopathy w/ mild DME, OU - former pt of Dr. [phone] and Dr. Baird Cancer  Records obtained from [facility]  Dr. [facility] -- [facility] 2008-2009  Dr. 09-23-1998 -- PPV, laser for [facility],  8.19.10    Focal laser 2.25.11 - exam shows preretinal fibrosis OU (OS > OD) - FA (8.13.19) with low signal, but shows late focal peripheral leakage OU - OCT with DME OU - S/P IVA OD (09.10.19), #2 (10.08.19), #3 (11.05.19), #4 (12.03.19) - S/P PRP fill in OS (08.13.19)  - S/P PRP fill in OD (08.27.19) - mild interval improvement in DME OD; OS w/ interval release of vitreous traction IT arcades - BCVA stable at 20/30 - recommend IVA OD #5, 01.09.20 - pt wishes to proceed with IVA OD today - RBA of procedure discussed, questions answered -  informed consent  obtained and signed - see procedure note - benefits investigation started for Eylea, 01.09.20 - f/u 4 weeks for DFE/OCT/possible injection  3,4. Hypertensive retinopathy OU - discussed importance of tight BP control - monitor  5. Pseudophakia OU  - s/p CE/IOL OU -- pt unable to recall when and with who  - doing well  - monitor  6. Epiretinal membrane, OS The natural history, anatomy, potential for loss of vision, and treatment options including vitrectomy techniques and the complications of endophthalmitis, retinal detachment, vitreous hemorrhage, cataract progression and permanent vision loss discussed with the patient. - mild ERM over macula - monitor  7. Preretinal fibrosis OU - OD: mild patches in periphery - OS: significant focal areas over disc and along distal temporal arcades -- causing traction and IRF - ?release of distal ST vitreous traction - monitor for now -- may require surgical intervention / peeling eventually   Ophthalmic Meds Ordered this visit:  Meds ordered this encounter  Medications  . Bevacizumab (AVASTIN) SOLN 1.25 mg       Return in about 4 weeks (around 07/02/2018).  There are no Patient Instructions on file for this visit.   This document serves as a record of services personally performed by Gardiner Sleeper, MD, PhD. It was created on their behalf by Ernest Mallick, OA, an ophthalmic assistant. The creation of this record is the provider's dictation and/or activities during the visit.    Electronically signed by: Ernest Mallick, OA  01.07.2020 8:52 AM   Gardiner Sleeper, M.D., Ph.D. Diseases & Surgery of the Retina and Vitreous [facility]  I have reviewed the above documentation for accuracy and completeness, and I agree with the above. Gardiner Sleeper, M.D., Ph.D. 06/04/18 8:55 AM    Abbreviations: M myopia (nearsighted); A astigmatism; H hyperopia (farsighted); P presbyopia; Mrx spectacle  prescription;  CTL contact lenses; OD right eye; OS left eye; OU both eyes  XT exotropia; ET esotropia; PEK punctate epithelial keratitis; PEE punctate epithelial erosions; DES dry eye syndrome; MGD meibomian gland dysfunction; ATs artificial tears; PFAT's preservative free artificial tears; [place] nuclear sclerotic cataract; PSC posterior subcapsular cataract; ERM epi-retinal membrane; PVD posterior vitreous detachment; RD retinal detachment; DM diabetes mellitus; DR diabetic retinopathy; NPDR non-proliferative diabetic retinopathy; PDR proliferative diabetic retinopathy; CSME clinically significant macular edema; DME diabetic macular edema; dbh dot blot hemorrhages; CWS cotton wool spot; POAG primary open angle glaucoma; C/D cup-to-disc ratio; HVF humphrey visual field; GVF goldmann visual field; OCT optical coherence tomography; IOP intraocular pressure; BRVO Branch retinal vein occlusion; CRVO central retinal vein occlusion; CRAO central retinal artery occlusion; BRAO branch retinal artery occlusion; RT retinal tear; SB scleral buckle; PPV pars plana vitrectomy; VH Vitreous hemorrhage; PRP panretinal laser photocoagulation; IVK intravitreal kenalog; VMT vitreomacular traction; MH Macular hole;  NVD neovascularization of the disc; NVE neovascularization elsewhere; AREDS age related eye disease study; ARMD age related macular degeneration; POAG primary open angle glaucoma; EBMD epithelial/anterior basement membrane dystrophy; ACIOL anterior chamber intraocular lens; IOL intraocular lens; PCIOL posterior chamber intraocular lens; Phaco/IOL phacoemulsification with intraocular lens placement; [place] photorefractive keratectomy; LASIK laser assisted in situ keratomileusis; HTN hypertension; DM diabetes mellitus; COPD chronic obstructive pulmonary disease

## 2018-06-04 ENCOUNTER — Ambulatory Visit (INDEPENDENT_AMBULATORY_CARE_PROVIDER_SITE_OTHER): Payer: Medicare HMO | Admitting: Ophthalmology

## 2018-06-04 ENCOUNTER — Encounter (INDEPENDENT_AMBULATORY_CARE_PROVIDER_SITE_OTHER): Payer: Self-pay | Admitting: Ophthalmology

## 2018-06-04 DIAGNOSIS — H35373 Puckering of macula, bilateral: Secondary | ICD-10-CM

## 2018-06-04 DIAGNOSIS — H3581 Retinal edema: Secondary | ICD-10-CM | POA: Diagnosis not present

## 2018-06-04 DIAGNOSIS — Z961 Presence of intraocular lens: Secondary | ICD-10-CM | POA: Diagnosis not present

## 2018-06-04 DIAGNOSIS — I10 Essential (primary) hypertension: Secondary | ICD-10-CM

## 2018-06-04 DIAGNOSIS — H35033 Hypertensive retinopathy, bilateral: Secondary | ICD-10-CM

## 2018-06-04 DIAGNOSIS — H35372 Puckering of macula, left eye: Secondary | ICD-10-CM | POA: Diagnosis not present

## 2018-06-04 DIAGNOSIS — E113513 Type 2 diabetes mellitus with proliferative diabetic retinopathy with macular edema, bilateral: Secondary | ICD-10-CM

## 2018-06-04 MED ORDER — BEVACIZUMAB CHEMO INJECTION 1.25MG/0.05ML SYRINGE FOR KALEIDOSCOPE
1.2500 mg | INTRAVITREAL | Status: AC
  Administered 2018-06-04: 1.25 mg via INTRAVITREAL

## 2018-06-18 DIAGNOSIS — T82858A Stenosis of vascular prosthetic devices, implants and grafts, initial encounter: Secondary | ICD-10-CM | POA: Diagnosis not present

## 2018-06-18 DIAGNOSIS — N186 End stage renal disease: Secondary | ICD-10-CM | POA: Diagnosis not present

## 2018-06-18 DIAGNOSIS — Z992 Dependence on renal dialysis: Secondary | ICD-10-CM | POA: Diagnosis not present

## 2018-06-18 DIAGNOSIS — I871 Compression of vein: Secondary | ICD-10-CM | POA: Diagnosis not present

## 2018-06-26 DIAGNOSIS — N186 End stage renal disease: Secondary | ICD-10-CM | POA: Diagnosis not present

## 2018-06-26 DIAGNOSIS — E1129 Type 2 diabetes mellitus with other diabetic kidney complication: Secondary | ICD-10-CM | POA: Diagnosis not present

## 2018-06-26 DIAGNOSIS — Z992 Dependence on renal dialysis: Secondary | ICD-10-CM | POA: Diagnosis not present

## 2018-06-29 DIAGNOSIS — D631 Anemia in chronic kidney disease: Secondary | ICD-10-CM | POA: Diagnosis not present

## 2018-06-29 DIAGNOSIS — N186 End stage renal disease: Secondary | ICD-10-CM | POA: Diagnosis not present

## 2018-06-29 DIAGNOSIS — D689 Coagulation defect, unspecified: Secondary | ICD-10-CM | POA: Diagnosis not present

## 2018-06-29 DIAGNOSIS — N2581 Secondary hyperparathyroidism of renal origin: Secondary | ICD-10-CM | POA: Diagnosis not present

## 2018-06-29 DIAGNOSIS — E1129 Type 2 diabetes mellitus with other diabetic kidney complication: Secondary | ICD-10-CM | POA: Diagnosis not present

## 2018-06-29 DIAGNOSIS — Z992 Dependence on renal dialysis: Secondary | ICD-10-CM | POA: Diagnosis not present

## 2018-06-29 NOTE — Progress Notes (Addendum)
[facility]  07/02/2018     CHIEF COMPLAINT Patient presents for Retina Follow Up   HISTORY OF PRESENT ILLNESS: Kristy Santos is a 68 y.o. female who presents to the clinic today for:   HPI    Retina Follow Up    Patient presents with  Diabetic Retinopathy.  In both eyes.  Severity is moderate.  Duration of 4 weeks.  Since onset it is stable.  I, the attending physician,  performed the HPI with the patient and updated documentation appropriately.          Comments    Patient states vision the same OU. BS was 122 this am. Last a1c was less than 8. Has checked weekly when goes to dialysis.        Last edited by Bernarda Caffey, MD on 07/02/2018  8:36 AM. (History)    pt states her vision is stable, she states she has seen some black floaters in her right eye  Referring physician: [name], MD [place], [place] [address]  HISTORICAL INFORMATION:   Selected notes from the MEDICAL RECORD NUMBER Referred by [name] for DM exam LEE:  Ocular Hx-pseudo OU PMH-DM (last A1C 6.4, taking Glyburide), heart disease, HTN    CURRENT MEDICATIONS: No current outpatient medications on file. (Ophthalmic Drugs)   Current Facility-Administered Medications (Ophthalmic Drugs)  Medication Route  . aflibercept (EYLEA) SOLN 2 mg Intravitreal   Current Outpatient Medications (Other)  Medication Sig  . amLODipine (NORVASC) 5 MG tablet Take 5 mg by mouth 2 (two) times daily.  Marland Kitchen aspirin EC 81 MG tablet Take 81 mg by mouth daily.  . calcium acetate, Phos Binder, (PHOSLYRA) 667 MG/5ML SOLN Take 800 mg by mouth 3 (three) times daily with meals.   . calcium carbonate (OS-CAL) 1250 (500 Ca) MG chewable tablet Chew 1 tablet by mouth daily.  . cloNIDine (CATAPRES) 0.1 MG tablet Take 0.1 mg by mouth 2 (two) times daily.  . fish oil-omega-3 fatty acids 1000 MG capsule Take 1 g by mouth daily.  . furosemide (LASIX) 20 MG tablet Take 80 mg by  mouth daily.   Marland Kitchen glyBURIDE (DIABETA) 5 MG tablet Take 10 mg by mouth 2 (two) times daily.  . metoprolol succinate (TOPROL-XL) 100 MG 24 hr tablet Take 50 mg by mouth at bedtime. Take with or immediately following a meal.   . Multiple Vitamin (MULTIVITAMIN WITH MINERALS) TABS Take 1 tablet by mouth daily.  Marland Kitchen oxyCODONE-acetaminophen (ROXICET) 5-325 MG tablet Take 1 tablet by mouth every 6 (six) hours as needed.  . sodium bicarbonate 325 MG tablet Take 325 mg by mouth 3 (three) times daily.    Current Facility-Administered Medications (Other)  Medication Route  . Bevacizumab (AVASTIN) SOLN 1.25 mg Intravitreal  . Bevacizumab (AVASTIN) SOLN 1.25 mg Intravitreal  . Bevacizumab (AVASTIN) SOLN 1.25 mg Intravitreal  . Bevacizumab (AVASTIN) SOLN 1.25 mg Intravitreal  . Bevacizumab (AVASTIN) SOLN 1.25 mg Intravitreal      REVIEW OF SYSTEMS: ROS    Positive for: Endocrine, Eyes   Negative for: Constitutional, Gastrointestinal, Neurological, Skin, Genitourinary, Musculoskeletal, HENT, Cardiovascular, Respiratory, Psychiatric, Allergic/Imm, Heme/Lymph   Last edited by [name] on 07/02/2018  7:50 AM. (History)       ALLERGIES Allergies  Allergen Reactions  . Bidil [Isosorb Dinitrate-Hydralazine] Shortness Of Breath    Chest pain, SOB    PAST MEDICAL HISTORY Past Medical History:  Diagnosis Date  . Chronic  kidney disease   . Diabetes mellitus   . Hypertension    Past Surgical History:  Procedure Laterality Date  . AV FISTULA PLACEMENT Left 09/19/2015   Procedure: Creation of Left  Arm Brachial Cephalic Arteriovenous Fistula;  Surgeon: Elam Dutch, MD;  Location: [facility];  Service: Vascular;  Laterality: Left;  . BREAST SURGERY  2009   right breast-benign  . CATARACT EXTRACTION    . COLONOSCOPY W/ BIOPSIES AND POLYPECTOMY    . ENDOMETRIAL BIOPSY  12/23/2011  . EYE SURGERY    . MULTIPLE TOOTH EXTRACTIONS    . REFRACTIVE SURGERY  2011   right eye  . TOTAL ABDOMINAL  HYSTERECTOMY W/ BILATERAL SALPINGOOPHORECTOMY  01/21/2012   w/ LND    FAMILY HISTORY Family History  Problem Relation Age of Onset  . Cancer Mother   . Diabetes Father     SOCIAL HISTORY Social History   Tobacco Use  . Smoking status: Never Smoker  . Smokeless tobacco: Never Used  . Tobacco comment: never used  Substance Use Topics  . Alcohol use: No  . Drug use: No         OPHTHALMIC EXAM:  Base Eye Exam    Visual Acuity (Snellen - Linear)      Right Left   Dist [place] 20/30 -1 20/20   Dist ph [place] 20/30 +2        Tonometry (Tonopen, 7:59 AM)      Right Left   Pressure 15 18       Pupils      Dark Light Shape React APD   Right 4 3 Round Brisk None   Left 4 3 Round Brisk None       Visual Fields (Counting fingers)      Left Right    Full Full       Extraocular Movement      Right Left    Full, Ortho Full, Ortho       Neuro/Psych    Oriented x3:  Yes   Mood/Affect:  Normal       Dilation    Both eyes:  1.0% Mydriacyl, 2.5% Phenylephrine @ 7:59 AM        Slit Lamp and Fundus Exam    Slit Lamp Exam      Right Left   Lids/Lashes Dermatochalasis - upper lid, mild Meibomian gland dysfunction Dermatochalasis - upper lid   Conjunctiva/Sclera Mild Melanosis, Nasal Pinguecula Melanosis, Temporal Pinguecula   Cornea Arcus, Temporal Well healed cataract wounds Arcus, Temporal Well healed cataract wounds   Anterior Chamber Deep and quiet Deep and quiet   Iris Round and dilated, No NVI Round and dilated, No NVI   Lens PC IOL in good position, trace PCO PC IOL in good position   Vitreous fibrosis along distal ST arcade, Vitreous syneresis Vitreous syneresis       Fundus Exam      Right Left   Disc Sharp, trace Pallor fibrosis overlying disc, Temporal Peripapillary atrophy   C/D Ratio 0.2 obscured   Macula Blunted foveal reflex, +residual edema, scattered DBH, Superior Exudates - improved, scattered focal laser scars, Epiretinal membrane, cluster of IRH and  exudates temporal to fovea -- improved, Retinal pigment epithelial mottling Blunted foveal reflex, Epiretinal membrane, scattered DBH, focal laser scars, small macroaneurysm along IT arcade   Vessels Vascular attenuation, Copper wiring, AV crossing changes, mildly Tortuous, fibrosis along distal ST arcade -- stable, ?regressing Vascular attenuation, Copper wiring, AV crossing changes, mildly Tortuous,  fibrosis overlying distal ST arcade, ?release IT arcades   Periphery Attached, dense 360 PRP and good fill-in, scattered patches of fibrosis Attached, scattered pattern 360 PRP in place with good fill-in, scattered fibrosis - appears to be regressing          IMAGING AND PROCEDURES  Imaging and Procedures for @TODAY @  OCT, Retina - OU - Both Eyes       Right Eye Quality was good. Central Foveal Thickness: 284. Progression has been stable. Findings include abnormal foveal contour, no SRF, retinal drusen , outer retinal atrophy, intraretinal fluid (Persistent IRF).   Left Eye Quality was good. Central Foveal Thickness: 218. Progression has been stable. Findings include normal foveal contour, no SRF, outer retinal atrophy, no IRF (Vitreous traction at disc and distal arcades).   Notes *Images captured and stored on drive  Diagnosis / Impression:  OD: irregular foveal contour and lamination; +IRF -- persistent, +DME OS: mild atrophy; Vitreous traction at disc and distal arcades   Clinical management:  See below  Abbreviations: NFP - Normal foveal profile. CME - cystoid macular edema. PED - pigment epithelial detachment. IRF - intraretinal fluid. SRF - subretinal fluid. EZ - ellipsoid zone. ERM - epiretinal membrane. ORA - outer retinal atrophy. ORT - outer retinal tubulation. SRHM - subretinal hyper-reflective material         Intravitreal Injection, Pharmacologic Agent - OD - Right Eye       Time Out 07/02/2018. 10:00 AM. Confirmed correct patient, procedure, site, and patient  consented.   Anesthesia Topical anesthesia was used. Anesthetic medications included Lidocaine 2%, Proparacaine 0.5%.   Procedure Preparation included 5% betadine to ocular surface, eyelid speculum. A 30 gauge needle was used.   Injection:  2 mg aflibercept Alfonse Flavors) SOLN   NDC: M7179715, Lot: [id number], Expiration date: 05/26/2019   Route: Intravitreal, Site: Right Eye, Waste: 0.05 mL  Post-op Post injection exam found visual acuity of at least counting fingers. The patient tolerated the procedure well. There were no complications. The patient received written and verbal post procedure care education.                 ASSESSMENT/PLAN:    ICD-10-CM   1. Proliferative diabetic retinopathy of both eyes with macular edema associated with type 2 diabetes mellitus (HCC) O16.0737 Intravitreal Injection, Pharmacologic Agent - OD - Right Eye    aflibercept (EYLEA) SOLN 2 mg  2. Retinal edema H35.81 OCT, Retina - OU - Both Eyes  3. Essential hypertension I10   4. Hypertensive retinopathy of both eyes H35.033   5. Pseudophakia of both eyes Z96.1   6. Epiretinal membrane (ERM) of left eye H35.372   7. 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 Proliferative diabetic retinopathy w/ mild DME, OU - former pt of Dr. Baird Cancer and Dr. Elonda Husky  Records obtained from [facility]  Dr. Elonda Husky -- [facility] 2008-2009  Dr. Baird Cancer -- [facility], laser for [facility],  8.19.10    Focal laser 2.25.11 - exam shows preretinal fibrosis OU (OS > OD) - FA (8.13.19) with low signal, but shows late focal peripheral leakage OU - OCT with DME OU - S/P PRP fill in OS (08.13.19)  - S/P PRP fill in OD (08.27.19) - S/P IVA OD (09.10.19), #2 (10.08.19), #3 (11.05.19), #4 (12.03.19), #5 (01.09.20) - mild interval improvement in DME OD; OS stable - BCVA stable at 20/30 OD, 20/20 OS - discussed possibility of switching medications due to very mild improvement in DME OD -- ?IVA  resistance - recommend IVE OD #1,  02.06.2020 - pt wishes to proceed with IVE OD today - RBA of procedure discussed, questions answered - informed consent obtained and signed - see procedure note - benefits investigation started for Eylea, 01.09.20 -- pt approved for Good Days - f/u 4 weeks for DFE/OCT/possible injection  3,4. Hypertensive retinopathy OU - discussed importance of tight BP control - monitor  5. Pseudophakia OU  - s/p CE/IOL OU -- pt unable to recall when and with who  - doing well  - monitor  6. Epiretinal membrane, OS The natural history, anatomy, potential for loss of vision, and treatment options including vitrectomy techniques and the complications of endophthalmitis, retinal detachment, vitreous hemorrhage, cataract progression and permanent vision loss discussed with the patient. - mild ERM over macula - monitor  7. Preretinal fibrosis OU - OD: mild patches in periphery - OS: significant focal areas over disc and along distal temporal arcades -- causing traction and IRF - ?release of distal ST vitreous traction - monitor for now -- may require surgical intervention / peeling eventually, but BCVA 20/30 OD and 20/20 OS   Ophthalmic Meds Ordered this visit:  Meds ordered this encounter  Medications  . aflibercept (EYLEA) SOLN 2 mg       Return in about 4 weeks (around 07/30/2018) for f/u PDR OU, DFE, OCT.  There are no Patient Instructions on file for this visit.  This document serves as a record of services personally performed by Gardiner Sleeper, MD, PhD. It was created on their behalf by Ernest Mallick, OA, an ophthalmic assistant. The creation of this record is the provider's dictation and/or activities during the visit.    Electronically signed by: Ernest Mallick, OA  02.03.2020 12:43 AM    Gardiner Sleeper, M.D., Ph.D. Diseases & Surgery of the Retina and Vitreous [facility]  I have reviewed the above documentation for accuracy and completeness, and I agree  with the above. Gardiner Sleeper, M.D., Ph.D. 07/03/18 12:43 AM   Abbreviations: M myopia (nearsighted); A astigmatism; H hyperopia (farsighted); P presbyopia; Mrx spectacle prescription;  CTL contact lenses; OD right eye; OS left eye; OU both eyes  XT exotropia; ET esotropia; PEK punctate epithelial keratitis; PEE punctate epithelial erosions; DES dry eye syndrome; MGD meibomian gland dysfunction; ATs artificial tears; PFAT's preservative free artificial tears; [place] nuclear sclerotic cataract; PSC posterior subcapsular cataract; ERM epi-retinal membrane; PVD posterior vitreous detachment; RD retinal detachment; DM diabetes mellitus; DR diabetic retinopathy; NPDR non-proliferative diabetic retinopathy; PDR proliferative diabetic retinopathy; CSME clinically significant macular edema; DME diabetic macular edema; dbh dot blot hemorrhages; CWS cotton wool spot; POAG primary open angle glaucoma; C/D cup-to-disc ratio; HVF humphrey visual field; GVF goldmann visual field; OCT optical coherence tomography; IOP intraocular pressure; BRVO Branch retinal vein occlusion; CRVO central retinal vein occlusion; CRAO central retinal artery occlusion; BRAO branch retinal artery occlusion; RT retinal tear; SB scleral buckle; PPV pars plana vitrectomy; VH Vitreous hemorrhage; PRP panretinal laser photocoagulation; IVK intravitreal kenalog; VMT vitreomacular traction; MH Macular hole;  NVD neovascularization of the disc; NVE neovascularization elsewhere; AREDS age related eye disease study; ARMD age related macular degeneration; POAG primary open angle glaucoma; EBMD epithelial/anterior basement membrane dystrophy; ACIOL anterior chamber intraocular lens; IOL intraocular lens; PCIOL posterior chamber intraocular lens; Phaco/IOL phacoemulsification with intraocular lens placement; [place] photorefractive keratectomy; LASIK laser assisted in situ keratomileusis; HTN hypertension; DM diabetes mellitus; COPD chronic obstructive pulmonary  disease

## 2018-07-02 ENCOUNTER — Encounter (INDEPENDENT_AMBULATORY_CARE_PROVIDER_SITE_OTHER): Payer: Self-pay | Admitting: Ophthalmology

## 2018-07-02 ENCOUNTER — Ambulatory Visit (INDEPENDENT_AMBULATORY_CARE_PROVIDER_SITE_OTHER): Payer: Medicare HMO | Admitting: Ophthalmology

## 2018-07-02 DIAGNOSIS — I10 Essential (primary) hypertension: Secondary | ICD-10-CM

## 2018-07-02 DIAGNOSIS — H35373 Puckering of macula, bilateral: Secondary | ICD-10-CM

## 2018-07-02 DIAGNOSIS — H3581 Retinal edema: Secondary | ICD-10-CM

## 2018-07-02 DIAGNOSIS — H35033 Hypertensive retinopathy, bilateral: Secondary | ICD-10-CM

## 2018-07-02 DIAGNOSIS — E113513 Type 2 diabetes mellitus with proliferative diabetic retinopathy with macular edema, bilateral: Secondary | ICD-10-CM | POA: Diagnosis not present

## 2018-07-02 DIAGNOSIS — Z961 Presence of intraocular lens: Secondary | ICD-10-CM

## 2018-07-02 DIAGNOSIS — H35372 Puckering of macula, left eye: Secondary | ICD-10-CM | POA: Diagnosis not present

## 2018-07-03 ENCOUNTER — Encounter (INDEPENDENT_AMBULATORY_CARE_PROVIDER_SITE_OTHER): Payer: Self-pay | Admitting: Ophthalmology

## 2018-07-03 MED ORDER — AFLIBERCEPT 2MG/0.05ML IZ SOLN FOR KALEIDOSCOPE
2.0000 mg | INTRAVITREAL | Status: AC
  Administered 2018-07-03: 2 mg via INTRAVITREAL

## 2018-07-25 DIAGNOSIS — E1129 Type 2 diabetes mellitus with other diabetic kidney complication: Secondary | ICD-10-CM | POA: Diagnosis not present

## 2018-07-25 DIAGNOSIS — N186 End stage renal disease: Secondary | ICD-10-CM | POA: Diagnosis not present

## 2018-07-25 DIAGNOSIS — Z992 Dependence on renal dialysis: Secondary | ICD-10-CM | POA: Diagnosis not present

## 2018-07-27 DIAGNOSIS — D689 Coagulation defect, unspecified: Secondary | ICD-10-CM | POA: Diagnosis not present

## 2018-07-27 DIAGNOSIS — N2581 Secondary hyperparathyroidism of renal origin: Secondary | ICD-10-CM | POA: Diagnosis not present

## 2018-07-27 DIAGNOSIS — N186 End stage renal disease: Secondary | ICD-10-CM | POA: Diagnosis not present

## 2018-07-30 ENCOUNTER — Encounter (INDEPENDENT_AMBULATORY_CARE_PROVIDER_SITE_OTHER): Payer: Medicare HMO | Admitting: Ophthalmology

## 2018-07-30 ENCOUNTER — Ambulatory Visit (INDEPENDENT_AMBULATORY_CARE_PROVIDER_SITE_OTHER): Payer: Medicare HMO | Admitting: Ophthalmology

## 2018-07-30 ENCOUNTER — Encounter (INDEPENDENT_AMBULATORY_CARE_PROVIDER_SITE_OTHER): Payer: Self-pay | Admitting: Ophthalmology

## 2018-07-30 DIAGNOSIS — H35372 Puckering of macula, left eye: Secondary | ICD-10-CM | POA: Diagnosis not present

## 2018-07-30 DIAGNOSIS — Z961 Presence of intraocular lens: Secondary | ICD-10-CM | POA: Diagnosis not present

## 2018-07-30 DIAGNOSIS — I10 Essential (primary) hypertension: Secondary | ICD-10-CM

## 2018-07-30 DIAGNOSIS — H35373 Puckering of macula, bilateral: Secondary | ICD-10-CM

## 2018-07-30 DIAGNOSIS — H35033 Hypertensive retinopathy, bilateral: Secondary | ICD-10-CM | POA: Diagnosis not present

## 2018-07-30 DIAGNOSIS — E113513 Type 2 diabetes mellitus with proliferative diabetic retinopathy with macular edema, bilateral: Secondary | ICD-10-CM

## 2018-07-30 DIAGNOSIS — H3581 Retinal edema: Secondary | ICD-10-CM

## 2018-07-30 MED ORDER — AFLIBERCEPT 2MG/0.05ML IZ SOLN FOR KALEIDOSCOPE
2.0000 mg | INTRAVITREAL | Status: AC
Start: 2018-07-30 — End: ?
  Administered 2018-07-30: 2 mg via INTRAVITREAL

## 2018-07-30 NOTE — Progress Notes (Signed)
[facility] Note  07/30/2018     CHIEF COMPLAINT Patient presents for Retina Follow Up   HISTORY OF PRESENT ILLNESS: Kristy Santos is a 68 y.o. female who presents to the clinic today for:   HPI    Retina Follow Up    Patient presents with  Diabetic Retinopathy.  In both eyes.  Severity is moderate.  Duration of 4 weeks.  Since onset it is stable.  I, the attending physician,  performed the HPI with the patient and updated documentation appropriately.          Comments    Patient states vision the same OU.        Last edited by Bernarda Caffey, MD on 07/30/2018  9:33 AM. (History)    pt states her vision is the same as last time  Referring physician: [name], MD [place], [place] [address]  HISTORICAL INFORMATION:   Selected notes from the MEDICAL RECORD NUMBER Referred by [name] for DM exam LEE:  Ocular Hx-pseudo OU PMH-DM (last A1C 6.4, taking Glyburide), heart disease, HTN    CURRENT MEDICATIONS: No current outpatient medications on file. (Ophthalmic Drugs)   Current Facility-Administered Medications (Ophthalmic Drugs)  Medication Route  . aflibercept (EYLEA) SOLN 2 mg Intravitreal  . aflibercept (EYLEA) SOLN 2 mg Intravitreal   Current Outpatient Medications (Other)  Medication Sig  . amLODipine (NORVASC) 5 MG tablet Take 5 mg by mouth 2 (two) times daily.  Marland Kitchen aspirin EC 81 MG tablet Take 81 mg by mouth daily.  . calcium acetate, Phos Binder, (PHOSLYRA) 667 MG/5ML SOLN Take 800 mg by mouth 3 (three) times daily with meals.   . calcium carbonate (OS-CAL) 1250 (500 Ca) MG chewable tablet Chew 1 tablet by mouth daily.  . cloNIDine (CATAPRES) 0.1 MG tablet Take 0.1 mg by mouth 2 (two) times daily.  . fish oil-omega-3 fatty acids 1000 MG capsule Take 1 g by mouth daily.  . furosemide (LASIX) 20 MG tablet Take 80 mg by mouth daily.   Marland Kitchen glyBURIDE (DIABETA) 5 MG tablet Take 10 mg by mouth 2 (two) times  daily.  . metoprolol succinate (TOPROL-XL) 100 MG 24 hr tablet Take 50 mg by mouth at bedtime. Take with or immediately following a meal.   . Multiple Vitamin (MULTIVITAMIN WITH MINERALS) TABS Take 1 tablet by mouth daily.  Marland Kitchen oxyCODONE-acetaminophen (ROXICET) 5-325 MG tablet Take 1 tablet by mouth every 6 (six) hours as needed.  . sodium bicarbonate 325 MG tablet Take 325 mg by mouth 3 (three) times daily.    Current Facility-Administered Medications (Other)  Medication Route  . Bevacizumab (AVASTIN) SOLN 1.25 mg Intravitreal  . Bevacizumab (AVASTIN) SOLN 1.25 mg Intravitreal  . Bevacizumab (AVASTIN) SOLN 1.25 mg Intravitreal  . Bevacizumab (AVASTIN) SOLN 1.25 mg Intravitreal  . Bevacizumab (AVASTIN) SOLN 1.25 mg Intravitreal      REVIEW OF SYSTEMS: ROS    Positive for: Endocrine, Eyes   Negative for: Constitutional, Gastrointestinal, Neurological, Skin, Genitourinary, Musculoskeletal, HENT, Cardiovascular, Respiratory, Psychiatric, Allergic/Imm, Heme/Lymph   Last edited by [name] on 07/30/2018  9:15 AM. (History)       ALLERGIES Allergies  Allergen Reactions  . Bidil [Isosorb Dinitrate-Hydralazine] Shortness Of Breath    Chest pain, SOB    PAST MEDICAL HISTORY Past Medical History:  Diagnosis Date  . Chronic kidney disease   . Diabetes mellitus   . Hypertension    Past Surgical History:  Procedure Laterality Date  . AV FISTULA PLACEMENT Left 09/19/2015   Procedure: Creation of Left  Arm Brachial Cephalic Arteriovenous Fistula;  Surgeon: Elam Dutch, MD;  Location: [facility];  Service: Vascular;  Laterality: Left;  . BREAST SURGERY  2009   right breast-benign  . CATARACT EXTRACTION    . COLONOSCOPY W/ BIOPSIES AND POLYPECTOMY    . ENDOMETRIAL BIOPSY  12/23/2011  . EYE SURGERY    . MULTIPLE TOOTH EXTRACTIONS    . REFRACTIVE SURGERY  2011   right eye  . TOTAL ABDOMINAL HYSTERECTOMY W/ BILATERAL SALPINGOOPHORECTOMY  01/21/2012   w/ LND    FAMILY  HISTORY Family History  Problem Relation Age of Onset  . Cancer Mother   . Diabetes Father     SOCIAL HISTORY Social History   Tobacco Use  . Smoking status: Never Smoker  . Smokeless tobacco: Never Used  . Tobacco comment: never used  Substance Use Topics  . Alcohol use: No  . Drug use: No         OPHTHALMIC EXAM:  Base Eye Exam    Visual Acuity (Snellen - Linear)      Right Left   Dist [place] 20/40 20/20   Dist ph [place] 20/30 -2        Tonometry (Tonopen, 9:23 AM)      Right Left   Pressure 15 17       Pupils      Dark Light Shape React APD   Right 4 3 Round Brisk None   Left 4 3 Round Brisk None       Visual Fields (Counting fingers)      Left Right    Full Full       Extraocular Movement      Right Left    Full, Ortho Full, Ortho       Neuro/Psych    Oriented x3:  Yes   Mood/Affect:  Normal       Dilation    Both eyes:  1.0% Mydriacyl, 2.5% Phenylephrine @ 9:23 AM        Slit Lamp and Fundus Exam    Slit Lamp Exam      Right Left   Lids/Lashes Dermatochalasis - upper lid, mild Meibomian gland dysfunction Dermatochalasis - upper lid   Conjunctiva/Sclera Mild Melanosis, Nasal Pinguecula Melanosis, Temporal Pinguecula   Cornea Arcus, Temporal Well healed cataract wounds Arcus, Temporal Well healed cataract wounds   Anterior Chamber Deep and quiet Deep and quiet   Iris Round and dilated, No NVI Round and dilated, No NVI   Lens PC IOL in good position, trace PCO PC IOL in good position   Vitreous fibrosis along distal ST arcade, Vitreous syneresis Vitreous syneresis       Fundus Exam      Right Left   Disc Sharp, trace Pallor fibrosis overlying disc, Temporal Peripapillary atrophy   C/D Ratio 0.2 obscured   Macula Blunted foveal reflex, +cystic changes slightly improved, scattered DBH,scattered focal laser scars, Epiretinal membrane, cluster of IRH temporal to fovea, Retinal pigment epithelial mottling Blunted foveal reflex, Epiretinal membrane,  scattered DBH, focal laser scars, small macroaneurysm along IT arcade   Vessels Vascular attenuation, Copper wiring, AV crossing changes, mildly Tortuous, fibrosis along distal ST arcade -- stable, ?regressing Vascular attenuation, Copper wiring, AV crossing changes, mildly Tortuous, fibrosis overlying distal ST arcade, ?release IT arcades   Periphery Attached, dense 360 PRP and good fill-in, scattered patches of fibrosis Attached, scattered pattern 360  PRP in place with good fill-in, scattered fibrosis - appears to be regressing          IMAGING AND PROCEDURES  Imaging and Procedures for @TODAY @  OCT, Retina - OU - Both Eyes       Right Eye Quality was good. Central Foveal Thickness: 276. Progression has been stable. Findings include abnormal foveal contour, no SRF, retinal drusen , outer retinal atrophy, intraretinal fluid (Persistent IRF -- mild interval improvment).   Left Eye Quality was good. Central Foveal Thickness: 211. Progression has been stable. Findings include normal foveal contour, no SRF, outer retinal atrophy, no IRF (Vitreous traction at disc and distal arcades).   Notes *Images captured and stored on drive  Diagnosis / Impression:  OD: irregular foveal contour and lamination; +IRF/DME -- persistent, mild improvement from prior OS: mild atrophy; Vitreous traction at disc and distal arcades -- stable from prior  Clinical management:  See below  Abbreviations: NFP - Normal foveal profile. CME - cystoid macular edema. PED - pigment epithelial detachment. IRF - intraretinal fluid. SRF - subretinal fluid. EZ - ellipsoid zone. ERM - epiretinal membrane. ORA - outer retinal atrophy. ORT - outer retinal tubulation. SRHM - subretinal hyper-reflective material         Intravitreal Injection, Pharmacologic Agent - OD - Right Eye       Time Out 07/30/2018. 10:37 AM. Confirmed correct patient, procedure, site, and patient consented.   Anesthesia Topical anesthesia was  used. Anesthetic medications included Lidocaine 2%, Proparacaine 0.5%.   Procedure Preparation included 5% betadine to ocular surface, eyelid speculum. A 30 gauge needle was used.   Injection:  2 mg aflibercept Alfonse Flavors) SOLN   NDC: A3590391, Lot: [id number], Expiration date: 01/24/2019   Route: Intravitreal, Site: Right Eye, Waste: 0.05 mL  Post-op Post injection exam found visual acuity of at least counting fingers. The patient tolerated the procedure well. There were no complications. The patient received written and verbal post procedure care education.                 ASSESSMENT/PLAN:    ICD-10-CM   1. Proliferative diabetic retinopathy of both eyes with macular edema associated with type 2 diabetes mellitus (HCC) B44.9675 Intravitreal Injection, Pharmacologic Agent - OD - Right Eye    aflibercept (EYLEA) SOLN 2 mg  2. Retinal edema H35.81 OCT, Retina - OU - Both Eyes  3. Essential hypertension I10   4. Hypertensive retinopathy of both eyes H35.033   5. Pseudophakia of both eyes Z96.1   6. Epiretinal membrane (ERM) of left eye H35.372   7. 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 Proliferative diabetic retinopathy w/ mild DME, OU - former pt of Dr. Baird Cancer and Dr. Elonda Husky  Records obtained from [facility]  Dr. Elonda Husky -- [facility] 2008-2009  Dr. Baird Cancer -- PPV, laser for [facility],  8.19.10    Focal laser 2.25.11 - exam shows preretinal fibrosis OU (OS > OD) - FA (8.13.19) with low signal, but shows late focal peripheral leakage OU - OCT with DME OU - S/P PRP fill in OS (08.13.19)  - S/P PRP fill in OD (08.27.19) - S/P IVA OD (09.10.19), #2 (10.08.19), #3 (11.05.19), #4 (12.03.19), #5 (01.09.20) - discussed possibility of switching medications due to very mild improvement in DME OD -- ?IVA resistance - s/p IVE OD #1 (02.06.20) - mild interval improvement in DME OD; OS stable - BCVA stable at 20/30 OD, 20/20 OS - recommend IVE OD #2 today, 03.05.2020 - pt  wishes to  proceed with IVE OD today - RBA of procedure discussed, questions answered - informed consent obtained and signed - see procedure note - benefits investigation started for Eylea, 01.09.20 -- pt approved for Good Days - f/u 4 weeks for DFE/OCT/possible injection  3,4. Hypertensive retinopathy OU - discussed importance of tight BP control - monitor  5. Pseudophakia OU  - s/p CE/IOL OU -- pt unable to recall when and with who  - doing well  - monitor  6. Epiretinal membrane, OS The natural history, anatomy, potential for loss of vision, and treatment options including vitrectomy techniques and the complications of endophthalmitis, retinal detachment, vitreous hemorrhage, cataract progression and permanent vision loss discussed with the patient. - mild ERM over macula - monitor  7. Preretinal fibrosis OU - OD: mild patches in periphery - OS: significant focal areas over disc and along distal temporal arcades -- causing traction and IRF - stable OU - monitor for now -- may require surgical intervention / peeling eventually, but BCVA 20/30 OD and 20/20 OS   Ophthalmic Meds Ordered this visit:  Meds ordered this encounter  Medications  . aflibercept (EYLEA) SOLN 2 mg       Return in about 4 weeks (around 08/27/2018) for f/u PDR OU, DFE, OCT.  There are no Patient Instructions on file for this visit.  This document serves as a record of services personally performed by Gardiner Sleeper, MD, PhD. It was created on their behalf by Ernest Mallick, OA, an ophthalmic assistant. The creation of this record is the provider's dictation and/or activities during the visit.    Electronically signed by: Ernest Mallick, OA 03.05.2020 1:57 PM    Gardiner Sleeper, M.D., Ph.D. Diseases & Surgery of the Retina and Vitreous [facility]   I have reviewed the above documentation for accuracy and completeness, and I agree with the above. Gardiner Sleeper, M.D., Ph.D. 07/30/18 1:57  PM    Abbreviations: M myopia (nearsighted); A astigmatism; H hyperopia (farsighted); P presbyopia; Mrx spectacle prescription;  CTL contact lenses; OD right eye; OS left eye; OU both eyes  XT exotropia; ET esotropia; PEK punctate epithelial keratitis; PEE punctate epithelial erosions; DES dry eye syndrome; MGD meibomian gland dysfunction; ATs artificial tears; PFAT's preservative free artificial tears; [place] nuclear sclerotic cataract; PSC posterior subcapsular cataract; ERM epi-retinal membrane; PVD posterior vitreous detachment; RD retinal detachment; DM diabetes mellitus; DR diabetic retinopathy; NPDR non-proliferative diabetic retinopathy; PDR proliferative diabetic retinopathy; CSME clinically significant macular edema; DME diabetic macular edema; dbh dot blot hemorrhages; CWS cotton wool spot; POAG primary open angle glaucoma; C/D cup-to-disc ratio; HVF humphrey visual field; GVF goldmann visual field; OCT optical coherence tomography; IOP intraocular pressure; BRVO Branch retinal vein occlusion; CRVO central retinal vein occlusion; CRAO central retinal artery occlusion; BRAO branch retinal artery occlusion; RT retinal tear; SB scleral buckle; PPV pars plana vitrectomy; VH Vitreous hemorrhage; PRP panretinal laser photocoagulation; IVK intravitreal kenalog; VMT vitreomacular traction; MH Macular hole;  NVD neovascularization of the disc; NVE neovascularization elsewhere; AREDS age related eye disease study; ARMD age related macular degeneration; POAG primary open angle glaucoma; EBMD epithelial/anterior basement membrane dystrophy; ACIOL anterior chamber intraocular lens; IOL intraocular lens; PCIOL posterior chamber intraocular lens; Phaco/IOL phacoemulsification with intraocular lens placement; [place] photorefractive keratectomy; LASIK laser assisted in situ keratomileusis; HTN hypertension; DM diabetes mellitus; COPD chronic obstructive pulmonary disease

## 2018-08-02 DIAGNOSIS — K660 Peritoneal adhesions (postprocedural) (postinfection): Secondary | ICD-10-CM | POA: Diagnosis not present

## 2018-08-02 DIAGNOSIS — E872 Acidosis: Secondary | ICD-10-CM | POA: Diagnosis not present

## 2018-08-02 DIAGNOSIS — E1122 Type 2 diabetes mellitus with diabetic chronic kidney disease: Secondary | ICD-10-CM | POA: Diagnosis not present

## 2018-08-02 DIAGNOSIS — E11319 Type 2 diabetes mellitus with unspecified diabetic retinopathy without macular edema: Secondary | ICD-10-CM | POA: Diagnosis not present

## 2018-08-02 DIAGNOSIS — Z940 Kidney transplant status: Secondary | ICD-10-CM | POA: Diagnosis not present

## 2018-08-02 DIAGNOSIS — I120 Hypertensive chronic kidney disease with stage 5 chronic kidney disease or end stage renal disease: Secondary | ICD-10-CM | POA: Diagnosis not present

## 2018-08-02 DIAGNOSIS — I252 Old myocardial infarction: Secondary | ICD-10-CM | POA: Diagnosis not present

## 2018-08-02 DIAGNOSIS — T8611 Kidney transplant rejection: Secondary | ICD-10-CM | POA: Diagnosis not present

## 2018-08-02 DIAGNOSIS — T8619 Other complication of kidney transplant: Secondary | ICD-10-CM | POA: Diagnosis not present

## 2018-08-02 DIAGNOSIS — I10 Essential (primary) hypertension: Secondary | ICD-10-CM | POA: Diagnosis not present

## 2018-08-02 DIAGNOSIS — Z4822 Encounter for aftercare following kidney transplant: Secondary | ICD-10-CM | POA: Diagnosis not present

## 2018-08-02 DIAGNOSIS — I517 Cardiomegaly: Secondary | ICD-10-CM | POA: Diagnosis not present

## 2018-08-02 DIAGNOSIS — D631 Anemia in chronic kidney disease: Secondary | ICD-10-CM | POA: Diagnosis not present

## 2018-08-02 DIAGNOSIS — Z01818 Encounter for other preprocedural examination: Secondary | ICD-10-CM | POA: Diagnosis not present

## 2018-08-02 DIAGNOSIS — N261 Atrophy of kidney (terminal): Secondary | ICD-10-CM | POA: Diagnosis not present

## 2018-08-02 DIAGNOSIS — E669 Obesity, unspecified: Secondary | ICD-10-CM | POA: Diagnosis not present

## 2018-08-02 DIAGNOSIS — Z79899 Other long term (current) drug therapy: Secondary | ICD-10-CM | POA: Diagnosis not present

## 2018-08-02 DIAGNOSIS — Z4682 Encounter for fitting and adjustment of non-vascular catheter: Secondary | ICD-10-CM | POA: Diagnosis not present

## 2018-08-02 DIAGNOSIS — I151 Hypertension secondary to other renal disorders: Secondary | ICD-10-CM | POA: Diagnosis not present

## 2018-08-02 DIAGNOSIS — I2510 Atherosclerotic heart disease of native coronary artery without angina pectoris: Secondary | ICD-10-CM | POA: Diagnosis not present

## 2018-08-02 DIAGNOSIS — E8770 Fluid overload, unspecified: Secondary | ICD-10-CM | POA: Diagnosis not present

## 2018-08-02 DIAGNOSIS — R790 Abnormal level of blood mineral: Secondary | ICD-10-CM | POA: Diagnosis not present

## 2018-08-02 DIAGNOSIS — J9811 Atelectasis: Secondary | ICD-10-CM | POA: Diagnosis not present

## 2018-08-02 DIAGNOSIS — N186 End stage renal disease: Secondary | ICD-10-CM | POA: Diagnosis not present

## 2018-08-02 DIAGNOSIS — Z5181 Encounter for therapeutic drug level monitoring: Secondary | ICD-10-CM | POA: Diagnosis not present

## 2018-08-02 DIAGNOSIS — Z7952 Long term (current) use of systemic steroids: Secondary | ICD-10-CM | POA: Diagnosis not present

## 2018-08-02 DIAGNOSIS — I493 Ventricular premature depolarization: Secondary | ICD-10-CM | POA: Diagnosis not present

## 2018-08-02 DIAGNOSIS — E1140 Type 2 diabetes mellitus with diabetic neuropathy, unspecified: Secondary | ICD-10-CM | POA: Diagnosis not present

## 2018-08-02 DIAGNOSIS — Y830 Surgical operation with transplant of whole organ as the cause of abnormal reaction of the patient, or of later complication, without mention of misadventure at the time of the procedure: Secondary | ICD-10-CM | POA: Diagnosis not present

## 2018-08-02 DIAGNOSIS — Z992 Dependence on renal dialysis: Secondary | ICD-10-CM | POA: Diagnosis not present

## 2018-08-02 DIAGNOSIS — N189 Chronic kidney disease, unspecified: Secondary | ICD-10-CM | POA: Diagnosis not present

## 2018-08-02 DIAGNOSIS — N185 Chronic kidney disease, stage 5: Secondary | ICD-10-CM | POA: Diagnosis not present

## 2018-08-02 DIAGNOSIS — N269 Renal sclerosis, unspecified: Secondary | ICD-10-CM | POA: Diagnosis not present

## 2018-08-03 DIAGNOSIS — E8770 Fluid overload, unspecified: Secondary | ICD-10-CM | POA: Diagnosis not present

## 2018-08-03 DIAGNOSIS — Z940 Kidney transplant status: Secondary | ICD-10-CM | POA: Diagnosis not present

## 2018-08-03 DIAGNOSIS — E872 Acidosis: Secondary | ICD-10-CM | POA: Diagnosis not present

## 2018-08-03 DIAGNOSIS — E1140 Type 2 diabetes mellitus with diabetic neuropathy, unspecified: Secondary | ICD-10-CM | POA: Diagnosis not present

## 2018-08-03 DIAGNOSIS — I10 Essential (primary) hypertension: Secondary | ICD-10-CM | POA: Diagnosis not present

## 2018-08-03 DIAGNOSIS — Z79899 Other long term (current) drug therapy: Secondary | ICD-10-CM | POA: Diagnosis not present

## 2018-08-03 DIAGNOSIS — N185 Chronic kidney disease, stage 5: Secondary | ICD-10-CM | POA: Diagnosis not present

## 2018-08-03 DIAGNOSIS — T8611 Kidney transplant rejection: Secondary | ICD-10-CM | POA: Diagnosis not present

## 2018-08-03 DIAGNOSIS — D631 Anemia in chronic kidney disease: Secondary | ICD-10-CM | POA: Diagnosis not present

## 2018-08-03 DIAGNOSIS — I120 Hypertensive chronic kidney disease with stage 5 chronic kidney disease or end stage renal disease: Secondary | ICD-10-CM | POA: Diagnosis not present

## 2018-08-03 DIAGNOSIS — Z4822 Encounter for aftercare following kidney transplant: Secondary | ICD-10-CM | POA: Diagnosis not present

## 2018-08-03 DIAGNOSIS — Z5181 Encounter for therapeutic drug level monitoring: Secondary | ICD-10-CM | POA: Diagnosis not present

## 2018-08-04 DIAGNOSIS — N185 Chronic kidney disease, stage 5: Secondary | ICD-10-CM | POA: Diagnosis not present

## 2018-08-04 DIAGNOSIS — E872 Acidosis: Secondary | ICD-10-CM | POA: Diagnosis not present

## 2018-08-04 DIAGNOSIS — Z5181 Encounter for therapeutic drug level monitoring: Secondary | ICD-10-CM | POA: Diagnosis not present

## 2018-08-04 DIAGNOSIS — D631 Anemia in chronic kidney disease: Secondary | ICD-10-CM | POA: Diagnosis not present

## 2018-08-04 DIAGNOSIS — Z4822 Encounter for aftercare following kidney transplant: Secondary | ICD-10-CM | POA: Diagnosis not present

## 2018-08-04 DIAGNOSIS — E8770 Fluid overload, unspecified: Secondary | ICD-10-CM | POA: Diagnosis not present

## 2018-08-04 DIAGNOSIS — Z79899 Other long term (current) drug therapy: Secondary | ICD-10-CM | POA: Diagnosis not present

## 2018-08-04 DIAGNOSIS — E1140 Type 2 diabetes mellitus with diabetic neuropathy, unspecified: Secondary | ICD-10-CM | POA: Diagnosis not present

## 2018-08-04 DIAGNOSIS — Z7952 Long term (current) use of systemic steroids: Secondary | ICD-10-CM | POA: Diagnosis not present

## 2018-08-04 DIAGNOSIS — I120 Hypertensive chronic kidney disease with stage 5 chronic kidney disease or end stage renal disease: Secondary | ICD-10-CM | POA: Diagnosis not present

## 2018-08-04 DIAGNOSIS — Z940 Kidney transplant status: Secondary | ICD-10-CM | POA: Diagnosis not present

## 2018-08-04 DIAGNOSIS — Z992 Dependence on renal dialysis: Secondary | ICD-10-CM | POA: Diagnosis not present

## 2018-08-04 DIAGNOSIS — N186 End stage renal disease: Secondary | ICD-10-CM | POA: Diagnosis not present

## 2018-08-04 DIAGNOSIS — T8619 Other complication of kidney transplant: Secondary | ICD-10-CM | POA: Diagnosis not present

## 2018-08-04 DIAGNOSIS — I151 Hypertension secondary to other renal disorders: Secondary | ICD-10-CM | POA: Diagnosis not present

## 2018-08-04 DIAGNOSIS — E1122 Type 2 diabetes mellitus with diabetic chronic kidney disease: Secondary | ICD-10-CM | POA: Diagnosis not present

## 2018-08-05 DIAGNOSIS — Z79899 Other long term (current) drug therapy: Secondary | ICD-10-CM | POA: Diagnosis not present

## 2018-08-05 DIAGNOSIS — E1140 Type 2 diabetes mellitus with diabetic neuropathy, unspecified: Secondary | ICD-10-CM | POA: Diagnosis not present

## 2018-08-05 DIAGNOSIS — Z5181 Encounter for therapeutic drug level monitoring: Secondary | ICD-10-CM | POA: Diagnosis not present

## 2018-08-05 DIAGNOSIS — Z4822 Encounter for aftercare following kidney transplant: Secondary | ICD-10-CM | POA: Diagnosis not present

## 2018-08-05 DIAGNOSIS — I10 Essential (primary) hypertension: Secondary | ICD-10-CM | POA: Diagnosis not present

## 2018-08-05 DIAGNOSIS — Z4682 Encounter for fitting and adjustment of non-vascular catheter: Secondary | ICD-10-CM | POA: Diagnosis not present

## 2018-08-05 DIAGNOSIS — Z940 Kidney transplant status: Secondary | ICD-10-CM | POA: Diagnosis not present

## 2018-08-05 DIAGNOSIS — I120 Hypertensive chronic kidney disease with stage 5 chronic kidney disease or end stage renal disease: Secondary | ICD-10-CM | POA: Diagnosis not present

## 2018-08-05 DIAGNOSIS — N186 End stage renal disease: Secondary | ICD-10-CM | POA: Diagnosis not present

## 2018-08-05 DIAGNOSIS — D631 Anemia in chronic kidney disease: Secondary | ICD-10-CM | POA: Diagnosis not present

## 2018-08-05 DIAGNOSIS — E8770 Fluid overload, unspecified: Secondary | ICD-10-CM | POA: Diagnosis not present

## 2018-08-05 DIAGNOSIS — N185 Chronic kidney disease, stage 5: Secondary | ICD-10-CM | POA: Diagnosis not present

## 2018-08-05 DIAGNOSIS — E872 Acidosis: Secondary | ICD-10-CM | POA: Diagnosis not present

## 2018-08-06 DIAGNOSIS — E872 Acidosis: Secondary | ICD-10-CM | POA: Diagnosis not present

## 2018-08-06 DIAGNOSIS — N185 Chronic kidney disease, stage 5: Secondary | ICD-10-CM | POA: Diagnosis not present

## 2018-08-06 DIAGNOSIS — I120 Hypertensive chronic kidney disease with stage 5 chronic kidney disease or end stage renal disease: Secondary | ICD-10-CM | POA: Diagnosis not present

## 2018-08-06 DIAGNOSIS — I10 Essential (primary) hypertension: Secondary | ICD-10-CM | POA: Diagnosis not present

## 2018-08-06 DIAGNOSIS — D631 Anemia in chronic kidney disease: Secondary | ICD-10-CM | POA: Diagnosis not present

## 2018-08-06 DIAGNOSIS — Z4822 Encounter for aftercare following kidney transplant: Secondary | ICD-10-CM | POA: Diagnosis not present

## 2018-08-06 DIAGNOSIS — R790 Abnormal level of blood mineral: Secondary | ICD-10-CM | POA: Diagnosis not present

## 2018-08-06 DIAGNOSIS — E1140 Type 2 diabetes mellitus with diabetic neuropathy, unspecified: Secondary | ICD-10-CM | POA: Diagnosis not present

## 2018-08-06 DIAGNOSIS — Z79899 Other long term (current) drug therapy: Secondary | ICD-10-CM | POA: Diagnosis not present

## 2018-08-06 DIAGNOSIS — E8770 Fluid overload, unspecified: Secondary | ICD-10-CM | POA: Diagnosis not present

## 2018-08-06 DIAGNOSIS — Z940 Kidney transplant status: Secondary | ICD-10-CM | POA: Diagnosis not present

## 2018-08-06 DIAGNOSIS — Z5181 Encounter for therapeutic drug level monitoring: Secondary | ICD-10-CM | POA: Diagnosis not present

## 2018-08-07 DIAGNOSIS — N186 End stage renal disease: Secondary | ICD-10-CM | POA: Diagnosis not present

## 2018-08-07 DIAGNOSIS — I120 Hypertensive chronic kidney disease with stage 5 chronic kidney disease or end stage renal disease: Secondary | ICD-10-CM | POA: Diagnosis not present

## 2018-08-07 DIAGNOSIS — N185 Chronic kidney disease, stage 5: Secondary | ICD-10-CM | POA: Diagnosis not present

## 2018-08-07 DIAGNOSIS — D631 Anemia in chronic kidney disease: Secondary | ICD-10-CM | POA: Diagnosis not present

## 2018-08-07 DIAGNOSIS — Z4822 Encounter for aftercare following kidney transplant: Secondary | ICD-10-CM | POA: Diagnosis not present

## 2018-08-07 DIAGNOSIS — T8611 Kidney transplant rejection: Secondary | ICD-10-CM | POA: Diagnosis not present

## 2018-08-07 DIAGNOSIS — Z940 Kidney transplant status: Secondary | ICD-10-CM | POA: Diagnosis not present

## 2018-08-07 DIAGNOSIS — E1122 Type 2 diabetes mellitus with diabetic chronic kidney disease: Secondary | ICD-10-CM | POA: Diagnosis not present

## 2018-08-07 DIAGNOSIS — Z79899 Other long term (current) drug therapy: Secondary | ICD-10-CM | POA: Diagnosis not present

## 2018-08-07 DIAGNOSIS — Z5181 Encounter for therapeutic drug level monitoring: Secondary | ICD-10-CM | POA: Diagnosis not present

## 2018-08-10 DIAGNOSIS — E785 Hyperlipidemia, unspecified: Secondary | ICD-10-CM | POA: Diagnosis not present

## 2018-08-10 DIAGNOSIS — Z7952 Long term (current) use of systemic steroids: Secondary | ICD-10-CM | POA: Diagnosis not present

## 2018-08-10 DIAGNOSIS — Z940 Kidney transplant status: Secondary | ICD-10-CM | POA: Diagnosis not present

## 2018-08-10 DIAGNOSIS — E872 Acidosis: Secondary | ICD-10-CM | POA: Diagnosis not present

## 2018-08-10 DIAGNOSIS — Z79899 Other long term (current) drug therapy: Secondary | ICD-10-CM | POA: Diagnosis not present

## 2018-08-10 DIAGNOSIS — Z4822 Encounter for aftercare following kidney transplant: Secondary | ICD-10-CM | POA: Diagnosis not present

## 2018-08-10 DIAGNOSIS — E119 Type 2 diabetes mellitus without complications: Secondary | ICD-10-CM | POA: Diagnosis not present

## 2018-08-10 DIAGNOSIS — D649 Anemia, unspecified: Secondary | ICD-10-CM | POA: Diagnosis not present

## 2018-08-10 DIAGNOSIS — I10 Essential (primary) hypertension: Secondary | ICD-10-CM | POA: Diagnosis not present

## 2018-08-10 DIAGNOSIS — T8619 Other complication of kidney transplant: Secondary | ICD-10-CM | POA: Diagnosis not present

## 2018-08-10 DIAGNOSIS — Z794 Long term (current) use of insulin: Secondary | ICD-10-CM | POA: Diagnosis not present

## 2018-08-12 DIAGNOSIS — T8611 Kidney transplant rejection: Secondary | ICD-10-CM | POA: Diagnosis not present

## 2018-08-12 DIAGNOSIS — D649 Anemia, unspecified: Secondary | ICD-10-CM | POA: Diagnosis not present

## 2018-08-12 DIAGNOSIS — X58XXXD Exposure to other specified factors, subsequent encounter: Secondary | ICD-10-CM | POA: Diagnosis not present

## 2018-08-12 DIAGNOSIS — E8770 Fluid overload, unspecified: Secondary | ICD-10-CM | POA: Diagnosis not present

## 2018-08-12 DIAGNOSIS — Z940 Kidney transplant status: Secondary | ICD-10-CM | POA: Diagnosis not present

## 2018-08-12 DIAGNOSIS — Z4822 Encounter for aftercare following kidney transplant: Secondary | ICD-10-CM | POA: Diagnosis not present

## 2018-08-12 DIAGNOSIS — Z79899 Other long term (current) drug therapy: Secondary | ICD-10-CM | POA: Diagnosis not present

## 2018-08-12 DIAGNOSIS — Z7952 Long term (current) use of systemic steroids: Secondary | ICD-10-CM | POA: Diagnosis not present

## 2018-08-12 DIAGNOSIS — I120 Hypertensive chronic kidney disease with stage 5 chronic kidney disease or end stage renal disease: Secondary | ICD-10-CM | POA: Diagnosis not present

## 2018-08-12 DIAGNOSIS — E872 Acidosis: Secondary | ICD-10-CM | POA: Diagnosis not present

## 2018-08-12 DIAGNOSIS — N186 End stage renal disease: Secondary | ICD-10-CM | POA: Diagnosis not present

## 2018-08-12 DIAGNOSIS — I10 Essential (primary) hypertension: Secondary | ICD-10-CM | POA: Diagnosis not present

## 2018-08-12 DIAGNOSIS — D631 Anemia in chronic kidney disease: Secondary | ICD-10-CM | POA: Diagnosis not present

## 2018-08-12 DIAGNOSIS — Z792 Long term (current) use of antibiotics: Secondary | ICD-10-CM | POA: Diagnosis not present

## 2018-08-12 DIAGNOSIS — E1122 Type 2 diabetes mellitus with diabetic chronic kidney disease: Secondary | ICD-10-CM | POA: Diagnosis not present

## 2018-08-12 DIAGNOSIS — Z794 Long term (current) use of insulin: Secondary | ICD-10-CM | POA: Diagnosis not present

## 2018-08-12 DIAGNOSIS — E119 Type 2 diabetes mellitus without complications: Secondary | ICD-10-CM | POA: Diagnosis not present

## 2018-08-12 DIAGNOSIS — Z5181 Encounter for therapeutic drug level monitoring: Secondary | ICD-10-CM | POA: Diagnosis not present

## 2018-08-14 DIAGNOSIS — E872 Acidosis: Secondary | ICD-10-CM | POA: Diagnosis not present

## 2018-08-14 DIAGNOSIS — Z4822 Encounter for aftercare following kidney transplant: Secondary | ICD-10-CM | POA: Diagnosis not present

## 2018-08-14 DIAGNOSIS — N185 Chronic kidney disease, stage 5: Secondary | ICD-10-CM | POA: Diagnosis not present

## 2018-08-14 DIAGNOSIS — Z794 Long term (current) use of insulin: Secondary | ICD-10-CM | POA: Diagnosis not present

## 2018-08-14 DIAGNOSIS — I10 Essential (primary) hypertension: Secondary | ICD-10-CM | POA: Diagnosis not present

## 2018-08-14 DIAGNOSIS — Z940 Kidney transplant status: Secondary | ICD-10-CM | POA: Diagnosis not present

## 2018-08-14 DIAGNOSIS — Z79899 Other long term (current) drug therapy: Secondary | ICD-10-CM | POA: Diagnosis not present

## 2018-08-14 DIAGNOSIS — E119 Type 2 diabetes mellitus without complications: Secondary | ICD-10-CM | POA: Diagnosis not present

## 2018-08-14 DIAGNOSIS — Z5181 Encounter for therapeutic drug level monitoring: Secondary | ICD-10-CM | POA: Diagnosis not present

## 2018-08-14 DIAGNOSIS — D649 Anemia, unspecified: Secondary | ICD-10-CM | POA: Diagnosis not present

## 2018-08-14 DIAGNOSIS — E785 Hyperlipidemia, unspecified: Secondary | ICD-10-CM | POA: Diagnosis not present

## 2018-08-14 DIAGNOSIS — X58XXXD Exposure to other specified factors, subsequent encounter: Secondary | ICD-10-CM | POA: Diagnosis not present

## 2018-08-14 DIAGNOSIS — D631 Anemia in chronic kidney disease: Secondary | ICD-10-CM | POA: Diagnosis not present

## 2018-08-17 DIAGNOSIS — Z794 Long term (current) use of insulin: Secondary | ICD-10-CM | POA: Diagnosis not present

## 2018-08-17 DIAGNOSIS — T8611 Kidney transplant rejection: Secondary | ICD-10-CM | POA: Diagnosis not present

## 2018-08-17 DIAGNOSIS — E119 Type 2 diabetes mellitus without complications: Secondary | ICD-10-CM | POA: Diagnosis not present

## 2018-08-17 DIAGNOSIS — Z79899 Other long term (current) drug therapy: Secondary | ICD-10-CM | POA: Diagnosis not present

## 2018-08-17 DIAGNOSIS — Z940 Kidney transplant status: Secondary | ICD-10-CM | POA: Diagnosis not present

## 2018-08-17 DIAGNOSIS — E872 Acidosis: Secondary | ICD-10-CM | POA: Diagnosis not present

## 2018-08-17 DIAGNOSIS — D649 Anemia, unspecified: Secondary | ICD-10-CM | POA: Diagnosis not present

## 2018-08-17 DIAGNOSIS — I10 Essential (primary) hypertension: Secondary | ICD-10-CM | POA: Diagnosis not present

## 2018-08-17 DIAGNOSIS — Z4822 Encounter for aftercare following kidney transplant: Secondary | ICD-10-CM | POA: Diagnosis not present

## 2018-08-17 DIAGNOSIS — X58XXXD Exposure to other specified factors, subsequent encounter: Secondary | ICD-10-CM | POA: Diagnosis not present

## 2018-08-17 DIAGNOSIS — Z7952 Long term (current) use of systemic steroids: Secondary | ICD-10-CM | POA: Diagnosis not present

## 2018-08-17 DIAGNOSIS — E785 Hyperlipidemia, unspecified: Secondary | ICD-10-CM | POA: Diagnosis not present

## 2018-08-19 DIAGNOSIS — E0840 Diabetes mellitus due to underlying condition with diabetic neuropathy, unspecified: Secondary | ICD-10-CM | POA: Diagnosis not present

## 2018-08-19 DIAGNOSIS — E872 Acidosis: Secondary | ICD-10-CM | POA: Diagnosis not present

## 2018-08-19 DIAGNOSIS — D899 Disorder involving the immune mechanism, unspecified: Secondary | ICD-10-CM | POA: Diagnosis not present

## 2018-08-19 DIAGNOSIS — X58XXXD Exposure to other specified factors, subsequent encounter: Secondary | ICD-10-CM | POA: Diagnosis not present

## 2018-08-19 DIAGNOSIS — T8611 Kidney transplant rejection: Secondary | ICD-10-CM | POA: Diagnosis not present

## 2018-08-19 DIAGNOSIS — E1165 Type 2 diabetes mellitus with hyperglycemia: Secondary | ICD-10-CM | POA: Diagnosis not present

## 2018-08-19 DIAGNOSIS — Z4822 Encounter for aftercare following kidney transplant: Secondary | ICD-10-CM | POA: Diagnosis not present

## 2018-08-19 DIAGNOSIS — R609 Edema, unspecified: Secondary | ICD-10-CM | POA: Diagnosis not present

## 2018-08-19 DIAGNOSIS — E785 Hyperlipidemia, unspecified: Secondary | ICD-10-CM | POA: Diagnosis not present

## 2018-08-19 DIAGNOSIS — D649 Anemia, unspecified: Secondary | ICD-10-CM | POA: Diagnosis not present

## 2018-08-19 DIAGNOSIS — Z940 Kidney transplant status: Secondary | ICD-10-CM | POA: Diagnosis not present

## 2018-08-19 DIAGNOSIS — I10 Essential (primary) hypertension: Secondary | ICD-10-CM | POA: Diagnosis not present

## 2018-08-19 DIAGNOSIS — R600 Localized edema: Secondary | ICD-10-CM | POA: Diagnosis not present

## 2018-08-19 DIAGNOSIS — T8619 Other complication of kidney transplant: Secondary | ICD-10-CM | POA: Diagnosis not present

## 2018-08-21 DIAGNOSIS — Z79899 Other long term (current) drug therapy: Secondary | ICD-10-CM | POA: Diagnosis not present

## 2018-08-21 DIAGNOSIS — E872 Acidosis: Secondary | ICD-10-CM | POA: Diagnosis not present

## 2018-08-21 DIAGNOSIS — R600 Localized edema: Secondary | ICD-10-CM | POA: Diagnosis not present

## 2018-08-21 DIAGNOSIS — N186 End stage renal disease: Secondary | ICD-10-CM | POA: Diagnosis not present

## 2018-08-21 DIAGNOSIS — E1122 Type 2 diabetes mellitus with diabetic chronic kidney disease: Secondary | ICD-10-CM | POA: Diagnosis not present

## 2018-08-21 DIAGNOSIS — Z940 Kidney transplant status: Secondary | ICD-10-CM | POA: Diagnosis not present

## 2018-08-21 DIAGNOSIS — D649 Anemia, unspecified: Secondary | ICD-10-CM | POA: Diagnosis not present

## 2018-08-21 DIAGNOSIS — E8770 Fluid overload, unspecified: Secondary | ICD-10-CM | POA: Diagnosis not present

## 2018-08-21 DIAGNOSIS — I10 Essential (primary) hypertension: Secondary | ICD-10-CM | POA: Diagnosis not present

## 2018-08-21 DIAGNOSIS — Z992 Dependence on renal dialysis: Secondary | ICD-10-CM | POA: Diagnosis not present

## 2018-08-21 DIAGNOSIS — X58XXXD Exposure to other specified factors, subsequent encounter: Secondary | ICD-10-CM | POA: Diagnosis not present

## 2018-08-21 DIAGNOSIS — E785 Hyperlipidemia, unspecified: Secondary | ICD-10-CM | POA: Diagnosis not present

## 2018-08-21 DIAGNOSIS — D631 Anemia in chronic kidney disease: Secondary | ICD-10-CM | POA: Diagnosis not present

## 2018-08-21 DIAGNOSIS — Z4822 Encounter for aftercare following kidney transplant: Secondary | ICD-10-CM | POA: Diagnosis not present

## 2018-08-21 DIAGNOSIS — T8611 Kidney transplant rejection: Secondary | ICD-10-CM | POA: Diagnosis not present

## 2018-08-21 DIAGNOSIS — Z794 Long term (current) use of insulin: Secondary | ICD-10-CM | POA: Diagnosis not present

## 2018-08-21 DIAGNOSIS — E119 Type 2 diabetes mellitus without complications: Secondary | ICD-10-CM | POA: Diagnosis not present

## 2018-08-21 DIAGNOSIS — I120 Hypertensive chronic kidney disease with stage 5 chronic kidney disease or end stage renal disease: Secondary | ICD-10-CM | POA: Diagnosis not present

## 2018-08-24 DIAGNOSIS — E785 Hyperlipidemia, unspecified: Secondary | ICD-10-CM | POA: Diagnosis not present

## 2018-08-24 DIAGNOSIS — E1122 Type 2 diabetes mellitus with diabetic chronic kidney disease: Secondary | ICD-10-CM | POA: Diagnosis not present

## 2018-08-24 DIAGNOSIS — E872 Acidosis: Secondary | ICD-10-CM | POA: Diagnosis not present

## 2018-08-24 DIAGNOSIS — D8989 Other specified disorders involving the immune mechanism, not elsewhere classified: Secondary | ICD-10-CM | POA: Diagnosis not present

## 2018-08-24 DIAGNOSIS — Z940 Kidney transplant status: Secondary | ICD-10-CM | POA: Diagnosis not present

## 2018-08-24 DIAGNOSIS — T8611 Kidney transplant rejection: Secondary | ICD-10-CM | POA: Diagnosis not present

## 2018-08-24 DIAGNOSIS — Z4822 Encounter for aftercare following kidney transplant: Secondary | ICD-10-CM | POA: Diagnosis not present

## 2018-08-24 DIAGNOSIS — X58XXXD Exposure to other specified factors, subsequent encounter: Secondary | ICD-10-CM | POA: Diagnosis not present

## 2018-08-24 DIAGNOSIS — N189 Chronic kidney disease, unspecified: Secondary | ICD-10-CM | POA: Diagnosis not present

## 2018-08-24 DIAGNOSIS — E119 Type 2 diabetes mellitus without complications: Secondary | ICD-10-CM | POA: Diagnosis not present

## 2018-08-24 DIAGNOSIS — Z794 Long term (current) use of insulin: Secondary | ICD-10-CM | POA: Diagnosis not present

## 2018-08-24 DIAGNOSIS — D631 Anemia in chronic kidney disease: Secondary | ICD-10-CM | POA: Diagnosis not present

## 2018-08-24 DIAGNOSIS — N186 End stage renal disease: Secondary | ICD-10-CM | POA: Diagnosis not present

## 2018-08-24 DIAGNOSIS — I10 Essential (primary) hypertension: Secondary | ICD-10-CM | POA: Diagnosis not present

## 2018-08-24 DIAGNOSIS — E8770 Fluid overload, unspecified: Secondary | ICD-10-CM | POA: Diagnosis not present

## 2018-08-24 DIAGNOSIS — I120 Hypertensive chronic kidney disease with stage 5 chronic kidney disease or end stage renal disease: Secondary | ICD-10-CM | POA: Diagnosis not present

## 2018-08-26 DIAGNOSIS — Z940 Kidney transplant status: Secondary | ICD-10-CM | POA: Diagnosis not present

## 2018-08-26 DIAGNOSIS — T8611 Kidney transplant rejection: Secondary | ICD-10-CM | POA: Diagnosis not present

## 2018-08-26 DIAGNOSIS — X58XXXD Exposure to other specified factors, subsequent encounter: Secondary | ICD-10-CM | POA: Diagnosis not present

## 2018-08-28 DIAGNOSIS — E872 Acidosis: Secondary | ICD-10-CM | POA: Diagnosis not present

## 2018-08-28 DIAGNOSIS — I10 Essential (primary) hypertension: Secondary | ICD-10-CM | POA: Diagnosis not present

## 2018-08-28 DIAGNOSIS — Z79899 Other long term (current) drug therapy: Secondary | ICD-10-CM | POA: Diagnosis not present

## 2018-08-28 DIAGNOSIS — X58XXXD Exposure to other specified factors, subsequent encounter: Secondary | ICD-10-CM | POA: Diagnosis not present

## 2018-08-28 DIAGNOSIS — Z4822 Encounter for aftercare following kidney transplant: Secondary | ICD-10-CM | POA: Diagnosis not present

## 2018-08-28 DIAGNOSIS — D631 Anemia in chronic kidney disease: Secondary | ICD-10-CM | POA: Diagnosis not present

## 2018-08-28 DIAGNOSIS — E1165 Type 2 diabetes mellitus with hyperglycemia: Secondary | ICD-10-CM | POA: Diagnosis not present

## 2018-08-28 DIAGNOSIS — I898 Other specified noninfective disorders of lymphatic vessels and lymph nodes: Secondary | ICD-10-CM | POA: Diagnosis not present

## 2018-08-28 DIAGNOSIS — N189 Chronic kidney disease, unspecified: Secondary | ICD-10-CM | POA: Diagnosis not present

## 2018-08-28 DIAGNOSIS — I701 Atherosclerosis of renal artery: Secondary | ICD-10-CM | POA: Diagnosis not present

## 2018-08-28 DIAGNOSIS — Z794 Long term (current) use of insulin: Secondary | ICD-10-CM | POA: Diagnosis not present

## 2018-08-28 DIAGNOSIS — Z940 Kidney transplant status: Secondary | ICD-10-CM | POA: Diagnosis not present

## 2018-08-28 DIAGNOSIS — E0840 Diabetes mellitus due to underlying condition with diabetic neuropathy, unspecified: Secondary | ICD-10-CM | POA: Diagnosis not present

## 2018-08-28 DIAGNOSIS — Z7982 Long term (current) use of aspirin: Secondary | ICD-10-CM | POA: Diagnosis not present

## 2018-08-28 DIAGNOSIS — Z5181 Encounter for therapeutic drug level monitoring: Secondary | ICD-10-CM | POA: Diagnosis not present

## 2018-08-28 DIAGNOSIS — I151 Hypertension secondary to other renal disorders: Secondary | ICD-10-CM | POA: Diagnosis not present

## 2018-08-28 DIAGNOSIS — D649 Anemia, unspecified: Secondary | ICD-10-CM | POA: Diagnosis not present

## 2018-08-28 DIAGNOSIS — N2889 Other specified disorders of kidney and ureter: Secondary | ICD-10-CM | POA: Diagnosis not present

## 2018-08-28 DIAGNOSIS — E785 Hyperlipidemia, unspecified: Secondary | ICD-10-CM | POA: Diagnosis not present

## 2018-08-31 DIAGNOSIS — D649 Anemia, unspecified: Secondary | ICD-10-CM | POA: Diagnosis not present

## 2018-08-31 DIAGNOSIS — Z794 Long term (current) use of insulin: Secondary | ICD-10-CM | POA: Diagnosis not present

## 2018-08-31 DIAGNOSIS — Z4822 Encounter for aftercare following kidney transplant: Secondary | ICD-10-CM | POA: Diagnosis not present

## 2018-08-31 DIAGNOSIS — Z940 Kidney transplant status: Secondary | ICD-10-CM | POA: Diagnosis not present

## 2018-08-31 DIAGNOSIS — Z792 Long term (current) use of antibiotics: Secondary | ICD-10-CM | POA: Diagnosis not present

## 2018-08-31 DIAGNOSIS — Z5181 Encounter for therapeutic drug level monitoring: Secondary | ICD-10-CM | POA: Diagnosis not present

## 2018-08-31 DIAGNOSIS — Z7982 Long term (current) use of aspirin: Secondary | ICD-10-CM | POA: Diagnosis not present

## 2018-08-31 DIAGNOSIS — E1165 Type 2 diabetes mellitus with hyperglycemia: Secondary | ICD-10-CM | POA: Diagnosis not present

## 2018-08-31 DIAGNOSIS — D899 Disorder involving the immune mechanism, unspecified: Secondary | ICD-10-CM | POA: Diagnosis not present

## 2018-08-31 DIAGNOSIS — N2889 Other specified disorders of kidney and ureter: Secondary | ICD-10-CM | POA: Diagnosis not present

## 2018-08-31 DIAGNOSIS — E872 Acidosis: Secondary | ICD-10-CM | POA: Diagnosis not present

## 2018-08-31 DIAGNOSIS — I701 Atherosclerosis of renal artery: Secondary | ICD-10-CM | POA: Diagnosis not present

## 2018-08-31 DIAGNOSIS — Z79899 Other long term (current) drug therapy: Secondary | ICD-10-CM | POA: Diagnosis not present

## 2018-08-31 DIAGNOSIS — Z7952 Long term (current) use of systemic steroids: Secondary | ICD-10-CM | POA: Diagnosis not present

## 2018-08-31 DIAGNOSIS — E0840 Diabetes mellitus due to underlying condition with diabetic neuropathy, unspecified: Secondary | ICD-10-CM | POA: Diagnosis not present

## 2018-08-31 DIAGNOSIS — I10 Essential (primary) hypertension: Secondary | ICD-10-CM | POA: Diagnosis not present

## 2018-09-03 ENCOUNTER — Encounter (INDEPENDENT_AMBULATORY_CARE_PROVIDER_SITE_OTHER): Payer: Medicare HMO | Admitting: Ophthalmology

## 2018-09-04 DIAGNOSIS — N186 End stage renal disease: Secondary | ICD-10-CM | POA: Diagnosis not present

## 2018-09-04 DIAGNOSIS — D631 Anemia in chronic kidney disease: Secondary | ICD-10-CM | POA: Diagnosis not present

## 2018-09-04 DIAGNOSIS — Z794 Long term (current) use of insulin: Secondary | ICD-10-CM | POA: Diagnosis not present

## 2018-09-04 DIAGNOSIS — I898 Other specified noninfective disorders of lymphatic vessels and lymph nodes: Secondary | ICD-10-CM | POA: Diagnosis not present

## 2018-09-04 DIAGNOSIS — E1122 Type 2 diabetes mellitus with diabetic chronic kidney disease: Secondary | ICD-10-CM | POA: Diagnosis not present

## 2018-09-04 DIAGNOSIS — E785 Hyperlipidemia, unspecified: Secondary | ICD-10-CM | POA: Diagnosis not present

## 2018-09-04 DIAGNOSIS — I120 Hypertensive chronic kidney disease with stage 5 chronic kidney disease or end stage renal disease: Secondary | ICD-10-CM | POA: Diagnosis not present

## 2018-09-04 DIAGNOSIS — Z940 Kidney transplant status: Secondary | ICD-10-CM | POA: Diagnosis not present

## 2018-09-04 DIAGNOSIS — Z79899 Other long term (current) drug therapy: Secondary | ICD-10-CM | POA: Diagnosis not present

## 2018-09-04 DIAGNOSIS — Z4822 Encounter for aftercare following kidney transplant: Secondary | ICD-10-CM | POA: Diagnosis not present

## 2018-09-04 DIAGNOSIS — R609 Edema, unspecified: Secondary | ICD-10-CM | POA: Diagnosis not present

## 2018-09-07 DIAGNOSIS — R600 Localized edema: Secondary | ICD-10-CM | POA: Diagnosis not present

## 2018-09-07 DIAGNOSIS — D8989 Other specified disorders involving the immune mechanism, not elsewhere classified: Secondary | ICD-10-CM | POA: Diagnosis not present

## 2018-09-07 DIAGNOSIS — Z4822 Encounter for aftercare following kidney transplant: Secondary | ICD-10-CM | POA: Diagnosis not present

## 2018-09-07 DIAGNOSIS — Z940 Kidney transplant status: Secondary | ICD-10-CM | POA: Diagnosis not present

## 2018-09-07 DIAGNOSIS — E119 Type 2 diabetes mellitus without complications: Secondary | ICD-10-CM | POA: Diagnosis not present

## 2018-09-07 DIAGNOSIS — E785 Hyperlipidemia, unspecified: Secondary | ICD-10-CM | POA: Diagnosis not present

## 2018-09-07 DIAGNOSIS — E872 Acidosis: Secondary | ICD-10-CM | POA: Diagnosis not present

## 2018-09-07 DIAGNOSIS — Z7982 Long term (current) use of aspirin: Secondary | ICD-10-CM | POA: Diagnosis not present

## 2018-09-07 DIAGNOSIS — E8770 Fluid overload, unspecified: Secondary | ICD-10-CM | POA: Diagnosis not present

## 2018-09-07 DIAGNOSIS — D649 Anemia, unspecified: Secondary | ICD-10-CM | POA: Diagnosis not present

## 2018-09-07 DIAGNOSIS — I10 Essential (primary) hypertension: Secondary | ICD-10-CM | POA: Diagnosis not present

## 2018-09-10 DIAGNOSIS — D8989 Other specified disorders involving the immune mechanism, not elsewhere classified: Secondary | ICD-10-CM | POA: Diagnosis not present

## 2018-09-10 DIAGNOSIS — Z7982 Long term (current) use of aspirin: Secondary | ICD-10-CM | POA: Diagnosis not present

## 2018-09-10 DIAGNOSIS — N2889 Other specified disorders of kidney and ureter: Secondary | ICD-10-CM | POA: Diagnosis not present

## 2018-09-10 DIAGNOSIS — I120 Hypertensive chronic kidney disease with stage 5 chronic kidney disease or end stage renal disease: Secondary | ICD-10-CM | POA: Diagnosis not present

## 2018-09-10 DIAGNOSIS — D649 Anemia, unspecified: Secondary | ICD-10-CM | POA: Diagnosis not present

## 2018-09-10 DIAGNOSIS — E785 Hyperlipidemia, unspecified: Secondary | ICD-10-CM | POA: Diagnosis not present

## 2018-09-10 DIAGNOSIS — I10 Essential (primary) hypertension: Secondary | ICD-10-CM | POA: Diagnosis not present

## 2018-09-10 DIAGNOSIS — D631 Anemia in chronic kidney disease: Secondary | ICD-10-CM | POA: Diagnosis not present

## 2018-09-10 DIAGNOSIS — Z7952 Long term (current) use of systemic steroids: Secondary | ICD-10-CM | POA: Diagnosis not present

## 2018-09-10 DIAGNOSIS — Z940 Kidney transplant status: Secondary | ICD-10-CM | POA: Diagnosis not present

## 2018-09-10 DIAGNOSIS — Z794 Long term (current) use of insulin: Secondary | ICD-10-CM | POA: Diagnosis not present

## 2018-09-10 DIAGNOSIS — E119 Type 2 diabetes mellitus without complications: Secondary | ICD-10-CM | POA: Diagnosis not present

## 2018-09-10 DIAGNOSIS — N184 Chronic kidney disease, stage 4 (severe): Secondary | ICD-10-CM | POA: Diagnosis not present

## 2018-09-10 DIAGNOSIS — Z5181 Encounter for therapeutic drug level monitoring: Secondary | ICD-10-CM | POA: Diagnosis not present

## 2018-09-10 DIAGNOSIS — Z79899 Other long term (current) drug therapy: Secondary | ICD-10-CM | POA: Diagnosis not present

## 2018-09-17 DIAGNOSIS — Z7982 Long term (current) use of aspirin: Secondary | ICD-10-CM | POA: Diagnosis not present

## 2018-09-17 DIAGNOSIS — D631 Anemia in chronic kidney disease: Secondary | ICD-10-CM | POA: Diagnosis not present

## 2018-09-17 DIAGNOSIS — N183 Chronic kidney disease, stage 3 (moderate): Secondary | ICD-10-CM | POA: Diagnosis not present

## 2018-09-17 DIAGNOSIS — Z794 Long term (current) use of insulin: Secondary | ICD-10-CM | POA: Diagnosis not present

## 2018-09-17 DIAGNOSIS — E785 Hyperlipidemia, unspecified: Secondary | ICD-10-CM | POA: Diagnosis not present

## 2018-09-17 DIAGNOSIS — Z4822 Encounter for aftercare following kidney transplant: Secondary | ICD-10-CM | POA: Diagnosis not present

## 2018-09-17 DIAGNOSIS — R600 Localized edema: Secondary | ICD-10-CM | POA: Diagnosis not present

## 2018-09-17 DIAGNOSIS — I10 Essential (primary) hypertension: Secondary | ICD-10-CM | POA: Diagnosis not present

## 2018-09-17 DIAGNOSIS — D649 Anemia, unspecified: Secondary | ICD-10-CM | POA: Diagnosis not present

## 2018-09-17 DIAGNOSIS — I214 Non-ST elevation (NSTEMI) myocardial infarction: Secondary | ICD-10-CM | POA: Diagnosis not present

## 2018-09-17 DIAGNOSIS — D899 Disorder involving the immune mechanism, unspecified: Secondary | ICD-10-CM | POA: Diagnosis not present

## 2018-09-17 DIAGNOSIS — Z7952 Long term (current) use of systemic steroids: Secondary | ICD-10-CM | POA: Diagnosis not present

## 2018-09-17 DIAGNOSIS — E119 Type 2 diabetes mellitus without complications: Secondary | ICD-10-CM | POA: Diagnosis not present

## 2018-09-17 DIAGNOSIS — Z940 Kidney transplant status: Secondary | ICD-10-CM | POA: Diagnosis not present

## 2018-09-21 DIAGNOSIS — Z466 Encounter for fitting and adjustment of urinary device: Secondary | ICD-10-CM | POA: Diagnosis not present

## 2018-09-21 DIAGNOSIS — Z5181 Encounter for therapeutic drug level monitoring: Secondary | ICD-10-CM | POA: Diagnosis not present

## 2018-09-21 DIAGNOSIS — Z940 Kidney transplant status: Secondary | ICD-10-CM | POA: Diagnosis not present

## 2018-09-21 DIAGNOSIS — Z4822 Encounter for aftercare following kidney transplant: Secondary | ICD-10-CM | POA: Diagnosis not present

## 2018-09-21 DIAGNOSIS — Z7952 Long term (current) use of systemic steroids: Secondary | ICD-10-CM | POA: Diagnosis not present

## 2018-09-21 DIAGNOSIS — E785 Hyperlipidemia, unspecified: Secondary | ICD-10-CM | POA: Diagnosis not present

## 2018-09-21 DIAGNOSIS — D649 Anemia, unspecified: Secondary | ICD-10-CM | POA: Diagnosis not present

## 2018-09-21 DIAGNOSIS — I10 Essential (primary) hypertension: Secondary | ICD-10-CM | POA: Diagnosis not present

## 2018-09-21 DIAGNOSIS — D899 Disorder involving the immune mechanism, unspecified: Secondary | ICD-10-CM | POA: Diagnosis not present

## 2018-09-21 DIAGNOSIS — Z792 Long term (current) use of antibiotics: Secondary | ICD-10-CM | POA: Diagnosis not present

## 2018-09-21 DIAGNOSIS — Z7982 Long term (current) use of aspirin: Secondary | ICD-10-CM | POA: Diagnosis not present

## 2018-09-21 DIAGNOSIS — D702 Other drug-induced agranulocytosis: Secondary | ICD-10-CM | POA: Diagnosis not present

## 2018-09-21 DIAGNOSIS — Z79899 Other long term (current) drug therapy: Secondary | ICD-10-CM | POA: Diagnosis not present

## 2018-09-21 DIAGNOSIS — Z794 Long term (current) use of insulin: Secondary | ICD-10-CM | POA: Diagnosis not present

## 2018-09-22 DIAGNOSIS — I129 Hypertensive chronic kidney disease with stage 1 through stage 4 chronic kidney disease, or unspecified chronic kidney disease: Secondary | ICD-10-CM | POA: Diagnosis not present

## 2018-09-22 DIAGNOSIS — Z940 Kidney transplant status: Secondary | ICD-10-CM | POA: Diagnosis not present

## 2018-09-22 DIAGNOSIS — Z1331 Encounter for screening for depression: Secondary | ICD-10-CM | POA: Diagnosis not present

## 2018-09-22 DIAGNOSIS — D631 Anemia in chronic kidney disease: Secondary | ICD-10-CM | POA: Diagnosis not present

## 2018-09-22 DIAGNOSIS — Z0000 Encounter for general adult medical examination without abnormal findings: Secondary | ICD-10-CM | POA: Diagnosis not present

## 2018-09-22 DIAGNOSIS — N183 Chronic kidney disease, stage 3 (moderate): Secondary | ICD-10-CM | POA: Diagnosis not present

## 2018-09-22 DIAGNOSIS — I25118 Atherosclerotic heart disease of native coronary artery with other forms of angina pectoris: Secondary | ICD-10-CM | POA: Diagnosis not present

## 2018-09-22 DIAGNOSIS — E1169 Type 2 diabetes mellitus with other specified complication: Secondary | ICD-10-CM | POA: Diagnosis not present

## 2018-09-24 DIAGNOSIS — Z940 Kidney transplant status: Secondary | ICD-10-CM | POA: Diagnosis not present

## 2018-09-24 DIAGNOSIS — Z5181 Encounter for therapeutic drug level monitoring: Secondary | ICD-10-CM | POA: Diagnosis not present

## 2018-09-24 DIAGNOSIS — Z79899 Other long term (current) drug therapy: Secondary | ICD-10-CM | POA: Diagnosis not present

## 2018-09-28 DIAGNOSIS — E0822 Diabetes mellitus due to underlying condition with diabetic chronic kidney disease: Secondary | ICD-10-CM | POA: Diagnosis not present

## 2018-09-28 DIAGNOSIS — D72819 Decreased white blood cell count, unspecified: Secondary | ICD-10-CM | POA: Diagnosis not present

## 2018-09-28 DIAGNOSIS — Z4822 Encounter for aftercare following kidney transplant: Secondary | ICD-10-CM | POA: Diagnosis not present

## 2018-09-28 DIAGNOSIS — R600 Localized edema: Secondary | ICD-10-CM | POA: Diagnosis not present

## 2018-09-28 DIAGNOSIS — Z79899 Other long term (current) drug therapy: Secondary | ICD-10-CM | POA: Diagnosis not present

## 2018-09-28 DIAGNOSIS — Z792 Long term (current) use of antibiotics: Secondary | ICD-10-CM | POA: Diagnosis not present

## 2018-09-28 DIAGNOSIS — D649 Anemia, unspecified: Secondary | ICD-10-CM | POA: Diagnosis not present

## 2018-09-28 DIAGNOSIS — E785 Hyperlipidemia, unspecified: Secondary | ICD-10-CM | POA: Diagnosis not present

## 2018-09-28 DIAGNOSIS — Z940 Kidney transplant status: Secondary | ICD-10-CM | POA: Diagnosis not present

## 2018-09-28 DIAGNOSIS — E119 Type 2 diabetes mellitus without complications: Secondary | ICD-10-CM | POA: Diagnosis not present

## 2018-09-28 DIAGNOSIS — Z5181 Encounter for therapeutic drug level monitoring: Secondary | ICD-10-CM | POA: Diagnosis not present

## 2018-09-28 DIAGNOSIS — D708 Other neutropenia: Secondary | ICD-10-CM | POA: Diagnosis not present

## 2018-09-28 DIAGNOSIS — I10 Essential (primary) hypertension: Secondary | ICD-10-CM | POA: Diagnosis not present

## 2018-09-28 DIAGNOSIS — D8989 Other specified disorders involving the immune mechanism, not elsewhere classified: Secondary | ICD-10-CM | POA: Diagnosis not present

## 2018-10-01 DIAGNOSIS — Z940 Kidney transplant status: Secondary | ICD-10-CM | POA: Diagnosis not present

## 2018-10-01 DIAGNOSIS — Z4822 Encounter for aftercare following kidney transplant: Secondary | ICD-10-CM | POA: Diagnosis not present

## 2018-10-02 DIAGNOSIS — Z4822 Encounter for aftercare following kidney transplant: Secondary | ICD-10-CM | POA: Diagnosis not present

## 2018-10-02 DIAGNOSIS — D708 Other neutropenia: Secondary | ICD-10-CM | POA: Diagnosis not present

## 2018-10-05 DIAGNOSIS — I10 Essential (primary) hypertension: Secondary | ICD-10-CM | POA: Diagnosis not present

## 2018-10-05 DIAGNOSIS — R600 Localized edema: Secondary | ICD-10-CM | POA: Diagnosis not present

## 2018-10-05 DIAGNOSIS — I151 Hypertension secondary to other renal disorders: Secondary | ICD-10-CM | POA: Diagnosis not present

## 2018-10-05 DIAGNOSIS — E785 Hyperlipidemia, unspecified: Secondary | ICD-10-CM | POA: Diagnosis not present

## 2018-10-05 DIAGNOSIS — D649 Anemia, unspecified: Secondary | ICD-10-CM | POA: Diagnosis not present

## 2018-10-05 DIAGNOSIS — Z79899 Other long term (current) drug therapy: Secondary | ICD-10-CM | POA: Diagnosis not present

## 2018-10-05 DIAGNOSIS — Z7952 Long term (current) use of systemic steroids: Secondary | ICD-10-CM | POA: Diagnosis not present

## 2018-10-05 DIAGNOSIS — D72819 Decreased white blood cell count, unspecified: Secondary | ICD-10-CM | POA: Diagnosis not present

## 2018-10-05 DIAGNOSIS — Z4822 Encounter for aftercare following kidney transplant: Secondary | ICD-10-CM | POA: Diagnosis not present

## 2018-10-05 DIAGNOSIS — Z940 Kidney transplant status: Secondary | ICD-10-CM | POA: Diagnosis not present

## 2018-10-05 DIAGNOSIS — N2889 Other specified disorders of kidney and ureter: Secondary | ICD-10-CM | POA: Diagnosis not present

## 2018-10-05 DIAGNOSIS — D702 Other drug-induced agranulocytosis: Secondary | ICD-10-CM | POA: Diagnosis not present

## 2018-10-05 DIAGNOSIS — E119 Type 2 diabetes mellitus without complications: Secondary | ICD-10-CM | POA: Diagnosis not present

## 2018-10-08 DIAGNOSIS — E876 Hypokalemia: Secondary | ICD-10-CM | POA: Diagnosis not present

## 2018-10-08 DIAGNOSIS — Z4822 Encounter for aftercare following kidney transplant: Secondary | ICD-10-CM | POA: Diagnosis not present

## 2018-10-08 DIAGNOSIS — Z792 Long term (current) use of antibiotics: Secondary | ICD-10-CM | POA: Diagnosis not present

## 2018-10-08 DIAGNOSIS — Z79899 Other long term (current) drug therapy: Secondary | ICD-10-CM | POA: Diagnosis not present

## 2018-10-08 DIAGNOSIS — E0822 Diabetes mellitus due to underlying condition with diabetic chronic kidney disease: Secondary | ICD-10-CM | POA: Diagnosis not present

## 2018-10-08 DIAGNOSIS — Z5181 Encounter for therapeutic drug level monitoring: Secondary | ICD-10-CM | POA: Diagnosis not present

## 2018-10-08 DIAGNOSIS — D702 Other drug-induced agranulocytosis: Secondary | ICD-10-CM | POA: Diagnosis not present

## 2018-10-08 DIAGNOSIS — E119 Type 2 diabetes mellitus without complications: Secondary | ICD-10-CM | POA: Diagnosis not present

## 2018-10-08 DIAGNOSIS — I10 Essential (primary) hypertension: Secondary | ICD-10-CM | POA: Diagnosis not present

## 2018-10-08 DIAGNOSIS — Z940 Kidney transplant status: Secondary | ICD-10-CM | POA: Diagnosis not present

## 2018-10-08 DIAGNOSIS — D899 Disorder involving the immune mechanism, unspecified: Secondary | ICD-10-CM | POA: Diagnosis not present

## 2018-10-08 DIAGNOSIS — Z7952 Long term (current) use of systemic steroids: Secondary | ICD-10-CM | POA: Diagnosis not present

## 2018-10-08 DIAGNOSIS — D708 Other neutropenia: Secondary | ICD-10-CM | POA: Diagnosis not present

## 2018-10-12 DIAGNOSIS — D649 Anemia, unspecified: Secondary | ICD-10-CM | POA: Diagnosis not present

## 2018-10-12 DIAGNOSIS — Z79899 Other long term (current) drug therapy: Secondary | ICD-10-CM | POA: Diagnosis not present

## 2018-10-12 DIAGNOSIS — I151 Hypertension secondary to other renal disorders: Secondary | ICD-10-CM | POA: Diagnosis not present

## 2018-10-12 DIAGNOSIS — D72819 Decreased white blood cell count, unspecified: Secondary | ICD-10-CM | POA: Diagnosis not present

## 2018-10-12 DIAGNOSIS — N183 Chronic kidney disease, stage 3 (moderate): Secondary | ICD-10-CM | POA: Diagnosis not present

## 2018-10-12 DIAGNOSIS — Z4822 Encounter for aftercare following kidney transplant: Secondary | ICD-10-CM | POA: Diagnosis not present

## 2018-10-12 DIAGNOSIS — T8611 Kidney transplant rejection: Secondary | ICD-10-CM | POA: Diagnosis not present

## 2018-10-12 DIAGNOSIS — Z7952 Long term (current) use of systemic steroids: Secondary | ICD-10-CM | POA: Diagnosis not present

## 2018-10-12 DIAGNOSIS — Z794 Long term (current) use of insulin: Secondary | ICD-10-CM | POA: Diagnosis not present

## 2018-10-12 DIAGNOSIS — N186 End stage renal disease: Secondary | ICD-10-CM | POA: Diagnosis not present

## 2018-10-12 DIAGNOSIS — Z5181 Encounter for therapeutic drug level monitoring: Secondary | ICD-10-CM | POA: Diagnosis not present

## 2018-10-12 DIAGNOSIS — E119 Type 2 diabetes mellitus without complications: Secondary | ICD-10-CM | POA: Diagnosis not present

## 2018-10-12 DIAGNOSIS — D631 Anemia in chronic kidney disease: Secondary | ICD-10-CM | POA: Diagnosis not present

## 2018-10-12 DIAGNOSIS — Z940 Kidney transplant status: Secondary | ICD-10-CM | POA: Diagnosis not present

## 2018-10-12 DIAGNOSIS — I10 Essential (primary) hypertension: Secondary | ICD-10-CM | POA: Diagnosis not present

## 2018-10-12 DIAGNOSIS — D703 Neutropenia due to infection: Secondary | ICD-10-CM | POA: Diagnosis not present

## 2018-10-12 DIAGNOSIS — Z7982 Long term (current) use of aspirin: Secondary | ICD-10-CM | POA: Diagnosis not present

## 2018-10-12 DIAGNOSIS — D899 Disorder involving the immune mechanism, unspecified: Secondary | ICD-10-CM | POA: Diagnosis not present

## 2018-10-15 DIAGNOSIS — Z5181 Encounter for therapeutic drug level monitoring: Secondary | ICD-10-CM | POA: Diagnosis not present

## 2018-10-15 DIAGNOSIS — Z79899 Other long term (current) drug therapy: Secondary | ICD-10-CM | POA: Diagnosis not present

## 2018-10-15 DIAGNOSIS — Z940 Kidney transplant status: Secondary | ICD-10-CM | POA: Diagnosis not present

## 2018-10-22 DIAGNOSIS — Z940 Kidney transplant status: Secondary | ICD-10-CM | POA: Diagnosis not present

## 2018-10-22 DIAGNOSIS — Z01818 Encounter for other preprocedural examination: Secondary | ICD-10-CM | POA: Diagnosis not present

## 2018-10-26 DIAGNOSIS — I151 Hypertension secondary to other renal disorders: Secondary | ICD-10-CM | POA: Diagnosis not present

## 2018-10-26 DIAGNOSIS — D899 Disorder involving the immune mechanism, unspecified: Secondary | ICD-10-CM | POA: Diagnosis not present

## 2018-10-26 DIAGNOSIS — Z79899 Other long term (current) drug therapy: Secondary | ICD-10-CM | POA: Diagnosis not present

## 2018-10-26 DIAGNOSIS — Z7952 Long term (current) use of systemic steroids: Secondary | ICD-10-CM | POA: Diagnosis not present

## 2018-10-26 DIAGNOSIS — D702 Other drug-induced agranulocytosis: Secondary | ICD-10-CM | POA: Diagnosis not present

## 2018-10-26 DIAGNOSIS — D649 Anemia, unspecified: Secondary | ICD-10-CM | POA: Diagnosis not present

## 2018-10-26 DIAGNOSIS — Z7982 Long term (current) use of aspirin: Secondary | ICD-10-CM | POA: Diagnosis not present

## 2018-10-26 DIAGNOSIS — I10 Essential (primary) hypertension: Secondary | ICD-10-CM | POA: Diagnosis not present

## 2018-10-26 DIAGNOSIS — Z4822 Encounter for aftercare following kidney transplant: Secondary | ICD-10-CM | POA: Diagnosis not present

## 2018-10-26 DIAGNOSIS — Z940 Kidney transplant status: Secondary | ICD-10-CM | POA: Diagnosis not present

## 2018-10-26 DIAGNOSIS — Z794 Long term (current) use of insulin: Secondary | ICD-10-CM | POA: Diagnosis not present

## 2018-10-26 DIAGNOSIS — Z5181 Encounter for therapeutic drug level monitoring: Secondary | ICD-10-CM | POA: Diagnosis not present

## 2018-10-26 DIAGNOSIS — E119 Type 2 diabetes mellitus without complications: Secondary | ICD-10-CM | POA: Diagnosis not present

## 2018-10-26 DIAGNOSIS — N2889 Other specified disorders of kidney and ureter: Secondary | ICD-10-CM | POA: Diagnosis not present

## 2018-10-26 DIAGNOSIS — E785 Hyperlipidemia, unspecified: Secondary | ICD-10-CM | POA: Diagnosis not present

## 2018-10-30 DIAGNOSIS — Z79899 Other long term (current) drug therapy: Secondary | ICD-10-CM | POA: Diagnosis not present

## 2018-10-30 DIAGNOSIS — Z5181 Encounter for therapeutic drug level monitoring: Secondary | ICD-10-CM | POA: Diagnosis not present

## 2018-10-30 DIAGNOSIS — Z940 Kidney transplant status: Secondary | ICD-10-CM | POA: Diagnosis not present

## 2018-11-09 DIAGNOSIS — Z940 Kidney transplant status: Secondary | ICD-10-CM | POA: Diagnosis not present

## 2018-11-09 DIAGNOSIS — I10 Essential (primary) hypertension: Secondary | ICD-10-CM | POA: Diagnosis not present

## 2018-11-09 DIAGNOSIS — E0840 Diabetes mellitus due to underlying condition with diabetic neuropathy, unspecified: Secondary | ICD-10-CM | POA: Diagnosis not present

## 2018-11-09 DIAGNOSIS — D899 Disorder involving the immune mechanism, unspecified: Secondary | ICD-10-CM | POA: Diagnosis not present

## 2018-11-09 DIAGNOSIS — Z79899 Other long term (current) drug therapy: Secondary | ICD-10-CM | POA: Diagnosis not present

## 2018-11-09 DIAGNOSIS — Z5181 Encounter for therapeutic drug level monitoring: Secondary | ICD-10-CM | POA: Diagnosis not present

## 2018-11-09 DIAGNOSIS — D702 Other drug-induced agranulocytosis: Secondary | ICD-10-CM | POA: Diagnosis not present

## 2018-11-09 DIAGNOSIS — Z4822 Encounter for aftercare following kidney transplant: Secondary | ICD-10-CM | POA: Diagnosis not present

## 2018-11-16 DIAGNOSIS — Z4822 Encounter for aftercare following kidney transplant: Secondary | ICD-10-CM | POA: Diagnosis not present

## 2018-11-16 DIAGNOSIS — Z79899 Other long term (current) drug therapy: Secondary | ICD-10-CM | POA: Diagnosis not present

## 2018-11-16 DIAGNOSIS — Z940 Kidney transplant status: Secondary | ICD-10-CM | POA: Diagnosis not present

## 2018-11-18 DIAGNOSIS — E0822 Diabetes mellitus due to underlying condition with diabetic chronic kidney disease: Secondary | ICD-10-CM | POA: Diagnosis not present

## 2018-11-23 DIAGNOSIS — D649 Anemia, unspecified: Secondary | ICD-10-CM | POA: Diagnosis not present

## 2018-11-23 DIAGNOSIS — E785 Hyperlipidemia, unspecified: Secondary | ICD-10-CM | POA: Diagnosis not present

## 2018-11-23 DIAGNOSIS — R809 Proteinuria, unspecified: Secondary | ICD-10-CM | POA: Diagnosis not present

## 2018-11-23 DIAGNOSIS — Z4822 Encounter for aftercare following kidney transplant: Secondary | ICD-10-CM | POA: Diagnosis not present

## 2018-11-23 DIAGNOSIS — Z79899 Other long term (current) drug therapy: Secondary | ICD-10-CM | POA: Diagnosis not present

## 2018-11-23 DIAGNOSIS — E119 Type 2 diabetes mellitus without complications: Secondary | ICD-10-CM | POA: Diagnosis not present

## 2018-11-23 DIAGNOSIS — D72819 Decreased white blood cell count, unspecified: Secondary | ICD-10-CM | POA: Diagnosis not present

## 2018-11-23 DIAGNOSIS — Z792 Long term (current) use of antibiotics: Secondary | ICD-10-CM | POA: Diagnosis not present

## 2018-11-23 DIAGNOSIS — Z7952 Long term (current) use of systemic steroids: Secondary | ICD-10-CM | POA: Diagnosis not present

## 2018-11-23 DIAGNOSIS — I10 Essential (primary) hypertension: Secondary | ICD-10-CM | POA: Diagnosis not present

## 2018-12-03 DIAGNOSIS — Z4822 Encounter for aftercare following kidney transplant: Secondary | ICD-10-CM | POA: Diagnosis not present

## 2018-12-03 DIAGNOSIS — Z940 Kidney transplant status: Secondary | ICD-10-CM | POA: Diagnosis not present

## 2018-12-07 DIAGNOSIS — Z79899 Other long term (current) drug therapy: Secondary | ICD-10-CM | POA: Diagnosis not present

## 2018-12-07 DIAGNOSIS — E119 Type 2 diabetes mellitus without complications: Secondary | ICD-10-CM | POA: Diagnosis not present

## 2018-12-07 DIAGNOSIS — D899 Disorder involving the immune mechanism, unspecified: Secondary | ICD-10-CM | POA: Diagnosis not present

## 2018-12-07 DIAGNOSIS — E0822 Diabetes mellitus due to underlying condition with diabetic chronic kidney disease: Secondary | ICD-10-CM | POA: Diagnosis not present

## 2018-12-07 DIAGNOSIS — Z940 Kidney transplant status: Secondary | ICD-10-CM | POA: Diagnosis not present

## 2018-12-07 DIAGNOSIS — E785 Hyperlipidemia, unspecified: Secondary | ICD-10-CM | POA: Diagnosis not present

## 2018-12-07 DIAGNOSIS — N183 Chronic kidney disease, stage 3 (moderate): Secondary | ICD-10-CM | POA: Diagnosis not present

## 2018-12-07 DIAGNOSIS — Z4822 Encounter for aftercare following kidney transplant: Secondary | ICD-10-CM | POA: Diagnosis not present

## 2018-12-07 DIAGNOSIS — I151 Hypertension secondary to other renal disorders: Secondary | ICD-10-CM | POA: Diagnosis not present

## 2018-12-07 DIAGNOSIS — B258 Other cytomegaloviral diseases: Secondary | ICD-10-CM | POA: Diagnosis not present

## 2018-12-07 DIAGNOSIS — R600 Localized edema: Secondary | ICD-10-CM | POA: Diagnosis not present

## 2018-12-07 DIAGNOSIS — D631 Anemia in chronic kidney disease: Secondary | ICD-10-CM | POA: Diagnosis not present

## 2018-12-07 DIAGNOSIS — D72819 Decreased white blood cell count, unspecified: Secondary | ICD-10-CM | POA: Diagnosis not present

## 2018-12-07 DIAGNOSIS — Z5181 Encounter for therapeutic drug level monitoring: Secondary | ICD-10-CM | POA: Diagnosis not present

## 2018-12-07 DIAGNOSIS — I10 Essential (primary) hypertension: Secondary | ICD-10-CM | POA: Diagnosis not present

## 2018-12-07 DIAGNOSIS — D702 Other drug-induced agranulocytosis: Secondary | ICD-10-CM | POA: Diagnosis not present

## 2018-12-07 DIAGNOSIS — D649 Anemia, unspecified: Secondary | ICD-10-CM | POA: Diagnosis not present

## 2018-12-17 DIAGNOSIS — Z4822 Encounter for aftercare following kidney transplant: Secondary | ICD-10-CM | POA: Diagnosis not present

## 2018-12-17 DIAGNOSIS — Z940 Kidney transplant status: Secondary | ICD-10-CM | POA: Diagnosis not present

## 2018-12-21 DIAGNOSIS — I10 Essential (primary) hypertension: Secondary | ICD-10-CM | POA: Diagnosis not present

## 2018-12-21 DIAGNOSIS — Z4822 Encounter for aftercare following kidney transplant: Secondary | ICD-10-CM | POA: Diagnosis not present

## 2018-12-21 DIAGNOSIS — D8989 Other specified disorders involving the immune mechanism, not elsewhere classified: Secondary | ICD-10-CM | POA: Diagnosis not present

## 2018-12-21 DIAGNOSIS — Z794 Long term (current) use of insulin: Secondary | ICD-10-CM | POA: Diagnosis not present

## 2018-12-21 DIAGNOSIS — R600 Localized edema: Secondary | ICD-10-CM | POA: Diagnosis not present

## 2018-12-21 DIAGNOSIS — Z79899 Other long term (current) drug therapy: Secondary | ICD-10-CM | POA: Diagnosis not present

## 2018-12-21 DIAGNOSIS — E785 Hyperlipidemia, unspecified: Secondary | ICD-10-CM | POA: Diagnosis not present

## 2018-12-21 DIAGNOSIS — Z940 Kidney transplant status: Secondary | ICD-10-CM | POA: Diagnosis not present

## 2018-12-21 DIAGNOSIS — E119 Type 2 diabetes mellitus without complications: Secondary | ICD-10-CM | POA: Diagnosis not present

## 2018-12-21 DIAGNOSIS — D708 Other neutropenia: Secondary | ICD-10-CM | POA: Diagnosis not present

## 2018-12-24 DIAGNOSIS — Z940 Kidney transplant status: Secondary | ICD-10-CM | POA: Diagnosis not present

## 2018-12-25 DIAGNOSIS — M25561 Pain in right knee: Secondary | ICD-10-CM | POA: Diagnosis not present

## 2018-12-25 DIAGNOSIS — M79604 Pain in right leg: Secondary | ICD-10-CM | POA: Diagnosis not present

## 2018-12-25 DIAGNOSIS — E0822 Diabetes mellitus due to underlying condition with diabetic chronic kidney disease: Secondary | ICD-10-CM | POA: Diagnosis not present

## 2018-12-25 DIAGNOSIS — E669 Obesity, unspecified: Secondary | ICD-10-CM | POA: Diagnosis not present

## 2018-12-25 DIAGNOSIS — I120 Hypertensive chronic kidney disease with stage 5 chronic kidney disease or end stage renal disease: Secondary | ICD-10-CM | POA: Diagnosis not present

## 2018-12-25 DIAGNOSIS — T8611 Kidney transplant rejection: Secondary | ICD-10-CM | POA: Diagnosis not present

## 2018-12-25 DIAGNOSIS — N185 Chronic kidney disease, stage 5: Secondary | ICD-10-CM | POA: Diagnosis not present

## 2018-12-25 DIAGNOSIS — D72819 Decreased white blood cell count, unspecified: Secondary | ICD-10-CM | POA: Diagnosis not present

## 2018-12-25 DIAGNOSIS — Z6835 Body mass index (BMI) 35.0-35.9, adult: Secondary | ICD-10-CM | POA: Diagnosis not present

## 2018-12-25 DIAGNOSIS — Z5181 Encounter for therapeutic drug level monitoring: Secondary | ICD-10-CM | POA: Diagnosis not present

## 2018-12-25 DIAGNOSIS — Z79899 Other long term (current) drug therapy: Secondary | ICD-10-CM | POA: Diagnosis not present

## 2018-12-25 DIAGNOSIS — D709 Neutropenia, unspecified: Secondary | ICD-10-CM | POA: Diagnosis not present

## 2018-12-25 DIAGNOSIS — Z4822 Encounter for aftercare following kidney transplant: Secondary | ICD-10-CM | POA: Diagnosis not present

## 2018-12-25 DIAGNOSIS — Z940 Kidney transplant status: Secondary | ICD-10-CM | POA: Diagnosis not present

## 2018-12-25 DIAGNOSIS — M79661 Pain in right lower leg: Secondary | ICD-10-CM | POA: Diagnosis not present

## 2018-12-25 DIAGNOSIS — E1140 Type 2 diabetes mellitus with diabetic neuropathy, unspecified: Secondary | ICD-10-CM | POA: Diagnosis not present

## 2018-12-25 DIAGNOSIS — D702 Other drug-induced agranulocytosis: Secondary | ICD-10-CM | POA: Diagnosis not present

## 2018-12-25 DIAGNOSIS — D631 Anemia in chronic kidney disease: Secondary | ICD-10-CM | POA: Diagnosis not present

## 2018-12-25 DIAGNOSIS — Y830 Surgical operation with transplant of whole organ as the cause of abnormal reaction of the patient, or of later complication, without mention of misadventure at the time of the procedure: Secondary | ICD-10-CM | POA: Diagnosis not present

## 2018-12-26 DIAGNOSIS — E0822 Diabetes mellitus due to underlying condition with diabetic chronic kidney disease: Secondary | ICD-10-CM | POA: Diagnosis not present

## 2018-12-26 DIAGNOSIS — Z4822 Encounter for aftercare following kidney transplant: Secondary | ICD-10-CM | POA: Diagnosis not present

## 2018-12-26 DIAGNOSIS — M79604 Pain in right leg: Secondary | ICD-10-CM | POA: Diagnosis not present

## 2018-12-26 DIAGNOSIS — D72819 Decreased white blood cell count, unspecified: Secondary | ICD-10-CM | POA: Diagnosis not present

## 2018-12-26 DIAGNOSIS — Z79899 Other long term (current) drug therapy: Secondary | ICD-10-CM | POA: Diagnosis not present

## 2018-12-26 DIAGNOSIS — Z940 Kidney transplant status: Secondary | ICD-10-CM | POA: Diagnosis not present

## 2018-12-26 DIAGNOSIS — Z5181 Encounter for therapeutic drug level monitoring: Secondary | ICD-10-CM | POA: Diagnosis not present

## 2018-12-27 DIAGNOSIS — E1140 Type 2 diabetes mellitus with diabetic neuropathy, unspecified: Secondary | ICD-10-CM | POA: Diagnosis not present

## 2018-12-27 DIAGNOSIS — T8611 Kidney transplant rejection: Secondary | ICD-10-CM | POA: Diagnosis not present

## 2018-12-27 DIAGNOSIS — Z79899 Other long term (current) drug therapy: Secondary | ICD-10-CM | POA: Diagnosis not present

## 2018-12-27 DIAGNOSIS — N185 Chronic kidney disease, stage 5: Secondary | ICD-10-CM | POA: Diagnosis not present

## 2018-12-27 DIAGNOSIS — D709 Neutropenia, unspecified: Secondary | ICD-10-CM | POA: Diagnosis not present

## 2018-12-27 DIAGNOSIS — D631 Anemia in chronic kidney disease: Secondary | ICD-10-CM | POA: Diagnosis not present

## 2018-12-27 DIAGNOSIS — I120 Hypertensive chronic kidney disease with stage 5 chronic kidney disease or end stage renal disease: Secondary | ICD-10-CM | POA: Diagnosis not present

## 2018-12-27 DIAGNOSIS — Y830 Surgical operation with transplant of whole organ as the cause of abnormal reaction of the patient, or of later complication, without mention of misadventure at the time of the procedure: Secondary | ICD-10-CM | POA: Diagnosis not present

## 2018-12-27 DIAGNOSIS — D72819 Decreased white blood cell count, unspecified: Secondary | ICD-10-CM | POA: Diagnosis not present

## 2018-12-27 DIAGNOSIS — Z940 Kidney transplant status: Secondary | ICD-10-CM | POA: Diagnosis not present

## 2018-12-27 DIAGNOSIS — E0822 Diabetes mellitus due to underlying condition with diabetic chronic kidney disease: Secondary | ICD-10-CM | POA: Diagnosis not present

## 2018-12-27 DIAGNOSIS — Z5181 Encounter for therapeutic drug level monitoring: Secondary | ICD-10-CM | POA: Diagnosis not present

## 2018-12-27 DIAGNOSIS — Z6835 Body mass index (BMI) 35.0-35.9, adult: Secondary | ICD-10-CM | POA: Diagnosis not present

## 2018-12-27 DIAGNOSIS — Z4822 Encounter for aftercare following kidney transplant: Secondary | ICD-10-CM | POA: Diagnosis not present

## 2018-12-27 DIAGNOSIS — E669 Obesity, unspecified: Secondary | ICD-10-CM | POA: Diagnosis not present

## 2018-12-27 DIAGNOSIS — M25561 Pain in right knee: Secondary | ICD-10-CM | POA: Diagnosis not present

## 2018-12-31 DIAGNOSIS — B258 Other cytomegaloviral diseases: Secondary | ICD-10-CM | POA: Diagnosis not present

## 2018-12-31 DIAGNOSIS — I10 Essential (primary) hypertension: Secondary | ICD-10-CM | POA: Diagnosis not present

## 2018-12-31 DIAGNOSIS — D899 Disorder involving the immune mechanism, unspecified: Secondary | ICD-10-CM | POA: Diagnosis not present

## 2018-12-31 DIAGNOSIS — D703 Neutropenia due to infection: Secondary | ICD-10-CM | POA: Diagnosis not present

## 2018-12-31 DIAGNOSIS — B259 Cytomegaloviral disease, unspecified: Secondary | ICD-10-CM | POA: Diagnosis not present

## 2018-12-31 DIAGNOSIS — D72819 Decreased white blood cell count, unspecified: Secondary | ICD-10-CM | POA: Diagnosis not present

## 2018-12-31 DIAGNOSIS — R7881 Bacteremia: Secondary | ICD-10-CM | POA: Diagnosis not present

## 2018-12-31 DIAGNOSIS — E11649 Type 2 diabetes mellitus with hypoglycemia without coma: Secondary | ICD-10-CM | POA: Diagnosis not present

## 2018-12-31 DIAGNOSIS — B965 Pseudomonas (aeruginosa) (mallei) (pseudomallei) as the cause of diseases classified elsewhere: Secondary | ICD-10-CM | POA: Diagnosis not present

## 2018-12-31 DIAGNOSIS — Z4822 Encounter for aftercare following kidney transplant: Secondary | ICD-10-CM | POA: Diagnosis not present

## 2018-12-31 DIAGNOSIS — M25561 Pain in right knee: Secondary | ICD-10-CM | POA: Diagnosis not present

## 2018-12-31 DIAGNOSIS — E785 Hyperlipidemia, unspecified: Secondary | ICD-10-CM | POA: Diagnosis not present

## 2018-12-31 DIAGNOSIS — Z940 Kidney transplant status: Secondary | ICD-10-CM | POA: Diagnosis not present

## 2018-12-31 DIAGNOSIS — D649 Anemia, unspecified: Secondary | ICD-10-CM | POA: Diagnosis not present

## 2019-01-04 DIAGNOSIS — Z792 Long term (current) use of antibiotics: Secondary | ICD-10-CM | POA: Diagnosis not present

## 2019-01-04 DIAGNOSIS — Z940 Kidney transplant status: Secondary | ICD-10-CM | POA: Diagnosis not present

## 2019-01-04 DIAGNOSIS — B259 Cytomegaloviral disease, unspecified: Secondary | ICD-10-CM | POA: Diagnosis not present

## 2019-01-04 DIAGNOSIS — Z79899 Other long term (current) drug therapy: Secondary | ICD-10-CM | POA: Diagnosis not present

## 2019-01-04 DIAGNOSIS — N2889 Other specified disorders of kidney and ureter: Secondary | ICD-10-CM | POA: Diagnosis not present

## 2019-01-04 DIAGNOSIS — I10 Essential (primary) hypertension: Secondary | ICD-10-CM | POA: Diagnosis not present

## 2019-01-04 DIAGNOSIS — Z5181 Encounter for therapeutic drug level monitoring: Secondary | ICD-10-CM | POA: Diagnosis not present

## 2019-01-04 DIAGNOSIS — B965 Pseudomonas (aeruginosa) (mallei) (pseudomallei) as the cause of diseases classified elsewhere: Secondary | ICD-10-CM | POA: Diagnosis not present

## 2019-01-04 DIAGNOSIS — Z4822 Encounter for aftercare following kidney transplant: Secondary | ICD-10-CM | POA: Diagnosis not present

## 2019-01-04 DIAGNOSIS — D899 Disorder involving the immune mechanism, unspecified: Secondary | ICD-10-CM | POA: Diagnosis not present

## 2019-01-04 DIAGNOSIS — D649 Anemia, unspecified: Secondary | ICD-10-CM | POA: Diagnosis not present

## 2019-01-04 DIAGNOSIS — Z7952 Long term (current) use of systemic steroids: Secondary | ICD-10-CM | POA: Diagnosis not present

## 2019-01-04 DIAGNOSIS — I151 Hypertension secondary to other renal disorders: Secondary | ICD-10-CM | POA: Diagnosis not present

## 2019-01-04 DIAGNOSIS — E119 Type 2 diabetes mellitus without complications: Secondary | ICD-10-CM | POA: Diagnosis not present

## 2019-01-04 DIAGNOSIS — D72819 Decreased white blood cell count, unspecified: Secondary | ICD-10-CM | POA: Diagnosis not present

## 2019-01-11 DIAGNOSIS — Z4822 Encounter for aftercare following kidney transplant: Secondary | ICD-10-CM | POA: Diagnosis not present

## 2019-01-11 DIAGNOSIS — Z940 Kidney transplant status: Secondary | ICD-10-CM | POA: Diagnosis not present

## 2019-01-25 DIAGNOSIS — E0822 Diabetes mellitus due to underlying condition with diabetic chronic kidney disease: Secondary | ICD-10-CM | POA: Diagnosis not present

## 2019-01-25 DIAGNOSIS — Z4822 Encounter for aftercare following kidney transplant: Secondary | ICD-10-CM | POA: Diagnosis not present

## 2019-01-25 DIAGNOSIS — Z940 Kidney transplant status: Secondary | ICD-10-CM | POA: Diagnosis not present

## 2019-01-25 DIAGNOSIS — Z79899 Other long term (current) drug therapy: Secondary | ICD-10-CM | POA: Diagnosis not present

## 2019-02-08 DIAGNOSIS — D631 Anemia in chronic kidney disease: Secondary | ICD-10-CM | POA: Diagnosis not present

## 2019-02-08 DIAGNOSIS — E11649 Type 2 diabetes mellitus with hypoglycemia without coma: Secondary | ICD-10-CM | POA: Diagnosis not present

## 2019-02-08 DIAGNOSIS — I214 Non-ST elevation (NSTEMI) myocardial infarction: Secondary | ICD-10-CM | POA: Diagnosis not present

## 2019-02-08 DIAGNOSIS — I151 Hypertension secondary to other renal disorders: Secondary | ICD-10-CM | POA: Diagnosis not present

## 2019-02-08 DIAGNOSIS — E785 Hyperlipidemia, unspecified: Secondary | ICD-10-CM | POA: Diagnosis not present

## 2019-02-08 DIAGNOSIS — Z940 Kidney transplant status: Secondary | ICD-10-CM | POA: Diagnosis not present

## 2019-02-08 DIAGNOSIS — E0840 Diabetes mellitus due to underlying condition with diabetic neuropathy, unspecified: Secondary | ICD-10-CM | POA: Diagnosis not present

## 2019-02-08 DIAGNOSIS — D899 Disorder involving the immune mechanism, unspecified: Secondary | ICD-10-CM | POA: Diagnosis not present

## 2019-02-08 DIAGNOSIS — D72819 Decreased white blood cell count, unspecified: Secondary | ICD-10-CM | POA: Diagnosis not present

## 2019-02-08 DIAGNOSIS — Z79899 Other long term (current) drug therapy: Secondary | ICD-10-CM | POA: Diagnosis not present

## 2019-02-08 DIAGNOSIS — N183 Chronic kidney disease, stage 3 (moderate): Secondary | ICD-10-CM | POA: Diagnosis not present

## 2019-02-08 DIAGNOSIS — B259 Cytomegaloviral disease, unspecified: Secondary | ICD-10-CM | POA: Diagnosis not present

## 2019-02-08 DIAGNOSIS — I10 Essential (primary) hypertension: Secondary | ICD-10-CM | POA: Diagnosis not present

## 2019-02-08 DIAGNOSIS — D649 Anemia, unspecified: Secondary | ICD-10-CM | POA: Diagnosis not present

## 2019-02-08 DIAGNOSIS — Z5181 Encounter for therapeutic drug level monitoring: Secondary | ICD-10-CM | POA: Diagnosis not present

## 2019-02-08 DIAGNOSIS — D702 Other drug-induced agranulocytosis: Secondary | ICD-10-CM | POA: Diagnosis not present

## 2019-02-08 DIAGNOSIS — Z4822 Encounter for aftercare following kidney transplant: Secondary | ICD-10-CM | POA: Diagnosis not present

## 2019-02-19 DIAGNOSIS — R944 Abnormal results of kidney function studies: Secondary | ICD-10-CM | POA: Diagnosis not present

## 2019-02-19 DIAGNOSIS — D899 Disorder involving the immune mechanism, unspecified: Secondary | ICD-10-CM | POA: Diagnosis not present

## 2019-02-19 DIAGNOSIS — Z940 Kidney transplant status: Secondary | ICD-10-CM | POA: Diagnosis not present

## 2019-02-23 DIAGNOSIS — N269 Renal sclerosis, unspecified: Secondary | ICD-10-CM | POA: Diagnosis not present

## 2019-02-23 DIAGNOSIS — Z4822 Encounter for aftercare following kidney transplant: Secondary | ICD-10-CM | POA: Diagnosis not present

## 2019-02-23 DIAGNOSIS — Z79899 Other long term (current) drug therapy: Secondary | ICD-10-CM | POA: Diagnosis not present

## 2019-02-23 DIAGNOSIS — Z940 Kidney transplant status: Secondary | ICD-10-CM | POA: Diagnosis not present

## 2019-02-23 DIAGNOSIS — T8611 Kidney transplant rejection: Secondary | ICD-10-CM | POA: Diagnosis not present

## 2019-02-23 DIAGNOSIS — N261 Atrophy of kidney (terminal): Secondary | ICD-10-CM | POA: Diagnosis not present

## 2019-02-25 DIAGNOSIS — E0822 Diabetes mellitus due to underlying condition with diabetic chronic kidney disease: Secondary | ICD-10-CM | POA: Diagnosis not present

## 2019-02-26 DIAGNOSIS — E785 Hyperlipidemia, unspecified: Secondary | ICD-10-CM | POA: Diagnosis not present

## 2019-02-26 DIAGNOSIS — Z4822 Encounter for aftercare following kidney transplant: Secondary | ICD-10-CM | POA: Diagnosis not present

## 2019-02-26 DIAGNOSIS — B259 Cytomegaloviral disease, unspecified: Secondary | ICD-10-CM | POA: Diagnosis not present

## 2019-02-26 DIAGNOSIS — Z7952 Long term (current) use of systemic steroids: Secondary | ICD-10-CM | POA: Diagnosis not present

## 2019-02-26 DIAGNOSIS — D72819 Decreased white blood cell count, unspecified: Secondary | ICD-10-CM | POA: Diagnosis not present

## 2019-02-26 DIAGNOSIS — E119 Type 2 diabetes mellitus without complications: Secondary | ICD-10-CM | POA: Diagnosis not present

## 2019-02-26 DIAGNOSIS — D649 Anemia, unspecified: Secondary | ICD-10-CM | POA: Diagnosis not present

## 2019-02-26 DIAGNOSIS — B348 Other viral infections of unspecified site: Secondary | ICD-10-CM | POA: Diagnosis not present

## 2019-02-26 DIAGNOSIS — D8989 Other specified disorders involving the immune mechanism, not elsewhere classified: Secondary | ICD-10-CM | POA: Diagnosis not present

## 2019-02-26 DIAGNOSIS — I10 Essential (primary) hypertension: Secondary | ICD-10-CM | POA: Diagnosis not present

## 2019-02-26 DIAGNOSIS — Z940 Kidney transplant status: Secondary | ICD-10-CM | POA: Diagnosis not present

## 2019-02-26 DIAGNOSIS — Z792 Long term (current) use of antibiotics: Secondary | ICD-10-CM | POA: Diagnosis not present

## 2019-03-01 DIAGNOSIS — Z4822 Encounter for aftercare following kidney transplant: Secondary | ICD-10-CM | POA: Diagnosis not present

## 2019-03-01 DIAGNOSIS — Z7952 Long term (current) use of systemic steroids: Secondary | ICD-10-CM | POA: Diagnosis not present

## 2019-03-02 DIAGNOSIS — E785 Hyperlipidemia, unspecified: Secondary | ICD-10-CM | POA: Diagnosis not present

## 2019-03-02 DIAGNOSIS — E119 Type 2 diabetes mellitus without complications: Secondary | ICD-10-CM | POA: Diagnosis not present

## 2019-03-02 DIAGNOSIS — I151 Hypertension secondary to other renal disorders: Secondary | ICD-10-CM | POA: Diagnosis not present

## 2019-03-02 DIAGNOSIS — T8611 Kidney transplant rejection: Secondary | ICD-10-CM | POA: Diagnosis not present

## 2019-03-02 DIAGNOSIS — I10 Essential (primary) hypertension: Secondary | ICD-10-CM | POA: Diagnosis not present

## 2019-03-02 DIAGNOSIS — D72819 Decreased white blood cell count, unspecified: Secondary | ICD-10-CM | POA: Diagnosis not present

## 2019-03-02 DIAGNOSIS — Z4822 Encounter for aftercare following kidney transplant: Secondary | ICD-10-CM | POA: Diagnosis not present

## 2019-03-02 DIAGNOSIS — D702 Other drug-induced agranulocytosis: Secondary | ICD-10-CM | POA: Diagnosis not present

## 2019-03-02 DIAGNOSIS — B259 Cytomegaloviral disease, unspecified: Secondary | ICD-10-CM | POA: Diagnosis not present

## 2019-03-02 DIAGNOSIS — D649 Anemia, unspecified: Secondary | ICD-10-CM | POA: Diagnosis not present

## 2019-03-02 DIAGNOSIS — D703 Neutropenia due to infection: Secondary | ICD-10-CM | POA: Diagnosis not present

## 2019-03-02 DIAGNOSIS — E669 Obesity, unspecified: Secondary | ICD-10-CM | POA: Diagnosis not present

## 2019-03-02 DIAGNOSIS — E0840 Diabetes mellitus due to underlying condition with diabetic neuropathy, unspecified: Secondary | ICD-10-CM | POA: Diagnosis not present

## 2019-03-02 DIAGNOSIS — Z940 Kidney transplant status: Secondary | ICD-10-CM | POA: Diagnosis not present

## 2019-03-02 DIAGNOSIS — D849 Immunodeficiency, unspecified: Secondary | ICD-10-CM | POA: Diagnosis not present

## 2019-03-02 DIAGNOSIS — N2889 Other specified disorders of kidney and ureter: Secondary | ICD-10-CM | POA: Diagnosis not present

## 2019-03-02 DIAGNOSIS — Z7952 Long term (current) use of systemic steroids: Secondary | ICD-10-CM | POA: Diagnosis not present

## 2019-03-02 DIAGNOSIS — Z792 Long term (current) use of antibiotics: Secondary | ICD-10-CM | POA: Diagnosis not present

## 2019-03-08 DIAGNOSIS — E785 Hyperlipidemia, unspecified: Secondary | ICD-10-CM | POA: Diagnosis not present

## 2019-03-08 DIAGNOSIS — Z79899 Other long term (current) drug therapy: Secondary | ICD-10-CM | POA: Diagnosis not present

## 2019-03-08 DIAGNOSIS — Z7952 Long term (current) use of systemic steroids: Secondary | ICD-10-CM | POA: Diagnosis not present

## 2019-03-08 DIAGNOSIS — D849 Immunodeficiency, unspecified: Secondary | ICD-10-CM | POA: Diagnosis not present

## 2019-03-08 DIAGNOSIS — E119 Type 2 diabetes mellitus without complications: Secondary | ICD-10-CM | POA: Diagnosis not present

## 2019-03-08 DIAGNOSIS — Z940 Kidney transplant status: Secondary | ICD-10-CM | POA: Diagnosis not present

## 2019-03-08 DIAGNOSIS — E872 Acidosis: Secondary | ICD-10-CM | POA: Diagnosis not present

## 2019-03-08 DIAGNOSIS — Z4822 Encounter for aftercare following kidney transplant: Secondary | ICD-10-CM | POA: Diagnosis not present

## 2019-03-08 DIAGNOSIS — T8611 Kidney transplant rejection: Secondary | ICD-10-CM | POA: Diagnosis not present

## 2019-03-08 DIAGNOSIS — I10 Essential (primary) hypertension: Secondary | ICD-10-CM | POA: Diagnosis not present

## 2019-03-08 DIAGNOSIS — Z5181 Encounter for therapeutic drug level monitoring: Secondary | ICD-10-CM | POA: Diagnosis not present

## 2019-03-08 DIAGNOSIS — Z792 Long term (current) use of antibiotics: Secondary | ICD-10-CM | POA: Diagnosis not present

## 2019-03-08 DIAGNOSIS — D72819 Decreased white blood cell count, unspecified: Secondary | ICD-10-CM | POA: Diagnosis not present

## 2019-03-08 DIAGNOSIS — Z794 Long term (current) use of insulin: Secondary | ICD-10-CM | POA: Diagnosis not present

## 2019-03-08 DIAGNOSIS — D649 Anemia, unspecified: Secondary | ICD-10-CM | POA: Diagnosis not present

## 2019-03-15 DIAGNOSIS — N186 End stage renal disease: Secondary | ICD-10-CM | POA: Diagnosis not present

## 2019-03-15 DIAGNOSIS — D631 Anemia in chronic kidney disease: Secondary | ICD-10-CM | POA: Diagnosis not present

## 2019-03-15 DIAGNOSIS — Z940 Kidney transplant status: Secondary | ICD-10-CM | POA: Diagnosis not present

## 2019-03-15 DIAGNOSIS — Z79899 Other long term (current) drug therapy: Secondary | ICD-10-CM | POA: Diagnosis not present

## 2019-03-15 DIAGNOSIS — E1122 Type 2 diabetes mellitus with diabetic chronic kidney disease: Secondary | ICD-10-CM | POA: Diagnosis not present

## 2019-03-15 DIAGNOSIS — I120 Hypertensive chronic kidney disease with stage 5 chronic kidney disease or end stage renal disease: Secondary | ICD-10-CM | POA: Diagnosis not present

## 2019-03-15 DIAGNOSIS — Z4822 Encounter for aftercare following kidney transplant: Secondary | ICD-10-CM | POA: Diagnosis not present

## 2019-03-22 DIAGNOSIS — T8611 Kidney transplant rejection: Secondary | ICD-10-CM | POA: Diagnosis not present

## 2019-03-22 DIAGNOSIS — D703 Neutropenia due to infection: Secondary | ICD-10-CM | POA: Diagnosis not present

## 2019-03-22 DIAGNOSIS — N183 Chronic kidney disease, stage 3 unspecified: Secondary | ICD-10-CM | POA: Diagnosis not present

## 2019-03-22 DIAGNOSIS — Y836 Removal of other organ (partial) (total) as the cause of abnormal reaction of the patient, or of later complication, without mention of misadventure at the time of the procedure: Secondary | ICD-10-CM | POA: Diagnosis not present

## 2019-03-22 DIAGNOSIS — Z794 Long term (current) use of insulin: Secondary | ICD-10-CM | POA: Diagnosis not present

## 2019-03-22 DIAGNOSIS — B259 Cytomegaloviral disease, unspecified: Secondary | ICD-10-CM | POA: Diagnosis not present

## 2019-03-22 DIAGNOSIS — E119 Type 2 diabetes mellitus without complications: Secondary | ICD-10-CM | POA: Diagnosis not present

## 2019-03-22 DIAGNOSIS — Z940 Kidney transplant status: Secondary | ICD-10-CM | POA: Diagnosis not present

## 2019-03-22 DIAGNOSIS — R7881 Bacteremia: Secondary | ICD-10-CM | POA: Diagnosis not present

## 2019-03-22 DIAGNOSIS — I129 Hypertensive chronic kidney disease with stage 1 through stage 4 chronic kidney disease, or unspecified chronic kidney disease: Secondary | ICD-10-CM | POA: Diagnosis not present

## 2019-03-22 DIAGNOSIS — D849 Immunodeficiency, unspecified: Secondary | ICD-10-CM | POA: Diagnosis not present

## 2019-03-22 DIAGNOSIS — E1121 Type 2 diabetes mellitus with diabetic nephropathy: Secondary | ICD-10-CM | POA: Diagnosis not present

## 2019-03-22 DIAGNOSIS — N1832 Chronic kidney disease, stage 3b: Secondary | ICD-10-CM | POA: Diagnosis not present

## 2019-03-22 DIAGNOSIS — E872 Acidosis: Secondary | ICD-10-CM | POA: Diagnosis not present

## 2019-03-22 DIAGNOSIS — D649 Anemia, unspecified: Secondary | ICD-10-CM | POA: Diagnosis not present

## 2019-03-26 DIAGNOSIS — N183 Chronic kidney disease, stage 3 unspecified: Secondary | ICD-10-CM | POA: Diagnosis not present

## 2019-03-26 DIAGNOSIS — D631 Anemia in chronic kidney disease: Secondary | ICD-10-CM | POA: Diagnosis not present

## 2019-03-26 DIAGNOSIS — I129 Hypertensive chronic kidney disease with stage 1 through stage 4 chronic kidney disease, or unspecified chronic kidney disease: Secondary | ICD-10-CM | POA: Diagnosis not present

## 2019-03-26 DIAGNOSIS — E1165 Type 2 diabetes mellitus with hyperglycemia: Secondary | ICD-10-CM | POA: Diagnosis not present

## 2019-03-26 DIAGNOSIS — Z940 Kidney transplant status: Secondary | ICD-10-CM | POA: Diagnosis not present

## 2019-03-26 DIAGNOSIS — E785 Hyperlipidemia, unspecified: Secondary | ICD-10-CM | POA: Diagnosis not present

## 2019-03-26 DIAGNOSIS — D72819 Decreased white blood cell count, unspecified: Secondary | ICD-10-CM | POA: Diagnosis not present

## 2019-03-29 DIAGNOSIS — K219 Gastro-esophageal reflux disease without esophagitis: Secondary | ICD-10-CM | POA: Diagnosis not present

## 2019-03-29 DIAGNOSIS — Z794 Long term (current) use of insulin: Secondary | ICD-10-CM | POA: Diagnosis not present

## 2019-03-29 DIAGNOSIS — I10 Essential (primary) hypertension: Secondary | ICD-10-CM | POA: Diagnosis not present

## 2019-03-29 DIAGNOSIS — Z4822 Encounter for aftercare following kidney transplant: Secondary | ICD-10-CM | POA: Diagnosis not present

## 2019-03-29 DIAGNOSIS — Z7952 Long term (current) use of systemic steroids: Secondary | ICD-10-CM | POA: Diagnosis not present

## 2019-03-29 DIAGNOSIS — E785 Hyperlipidemia, unspecified: Secondary | ICD-10-CM | POA: Diagnosis not present

## 2019-03-29 DIAGNOSIS — E872 Acidosis: Secondary | ICD-10-CM | POA: Diagnosis not present

## 2019-03-29 DIAGNOSIS — D708 Other neutropenia: Secondary | ICD-10-CM | POA: Diagnosis not present

## 2019-03-29 DIAGNOSIS — T8611 Kidney transplant rejection: Secondary | ICD-10-CM | POA: Diagnosis not present

## 2019-03-29 DIAGNOSIS — E1165 Type 2 diabetes mellitus with hyperglycemia: Secondary | ICD-10-CM | POA: Diagnosis not present

## 2019-03-29 DIAGNOSIS — D72819 Decreased white blood cell count, unspecified: Secondary | ICD-10-CM | POA: Diagnosis not present

## 2019-03-29 DIAGNOSIS — Z940 Kidney transplant status: Secondary | ICD-10-CM | POA: Diagnosis not present

## 2019-03-29 DIAGNOSIS — D649 Anemia, unspecified: Secondary | ICD-10-CM | POA: Diagnosis not present

## 2019-03-29 DIAGNOSIS — E119 Type 2 diabetes mellitus without complications: Secondary | ICD-10-CM | POA: Diagnosis not present

## 2019-03-29 DIAGNOSIS — D849 Immunodeficiency, unspecified: Secondary | ICD-10-CM | POA: Diagnosis not present

## 2019-04-05 DIAGNOSIS — E785 Hyperlipidemia, unspecified: Secondary | ICD-10-CM | POA: Diagnosis not present

## 2019-04-05 DIAGNOSIS — K219 Gastro-esophageal reflux disease without esophagitis: Secondary | ICD-10-CM | POA: Diagnosis not present

## 2019-04-05 DIAGNOSIS — Z940 Kidney transplant status: Secondary | ICD-10-CM | POA: Diagnosis not present

## 2019-04-05 DIAGNOSIS — Z794 Long term (current) use of insulin: Secondary | ICD-10-CM | POA: Diagnosis not present

## 2019-04-05 DIAGNOSIS — E119 Type 2 diabetes mellitus without complications: Secondary | ICD-10-CM | POA: Diagnosis not present

## 2019-04-05 DIAGNOSIS — Z79899 Other long term (current) drug therapy: Secondary | ICD-10-CM | POA: Diagnosis not present

## 2019-04-05 DIAGNOSIS — E0822 Diabetes mellitus due to underlying condition with diabetic chronic kidney disease: Secondary | ICD-10-CM | POA: Diagnosis not present

## 2019-04-05 DIAGNOSIS — I10 Essential (primary) hypertension: Secondary | ICD-10-CM | POA: Diagnosis not present

## 2019-04-05 DIAGNOSIS — Z4822 Encounter for aftercare following kidney transplant: Secondary | ICD-10-CM | POA: Diagnosis not present

## 2019-04-05 DIAGNOSIS — D649 Anemia, unspecified: Secondary | ICD-10-CM | POA: Diagnosis not present

## 2019-04-05 DIAGNOSIS — D849 Immunodeficiency, unspecified: Secondary | ICD-10-CM | POA: Diagnosis not present

## 2019-04-05 DIAGNOSIS — D708 Other neutropenia: Secondary | ICD-10-CM | POA: Diagnosis not present

## 2019-04-12 DIAGNOSIS — Z862 Personal history of diseases of the blood and blood-forming organs and certain disorders involving the immune mechanism: Secondary | ICD-10-CM | POA: Diagnosis not present

## 2019-04-12 DIAGNOSIS — Z940 Kidney transplant status: Secondary | ICD-10-CM | POA: Diagnosis not present

## 2019-04-12 DIAGNOSIS — E785 Hyperlipidemia, unspecified: Secondary | ICD-10-CM | POA: Diagnosis not present

## 2019-04-12 DIAGNOSIS — Z4822 Encounter for aftercare following kidney transplant: Secondary | ICD-10-CM | POA: Diagnosis not present

## 2019-04-12 DIAGNOSIS — E872 Acidosis: Secondary | ICD-10-CM | POA: Diagnosis not present

## 2019-04-12 DIAGNOSIS — D72819 Decreased white blood cell count, unspecified: Secondary | ICD-10-CM | POA: Diagnosis not present

## 2019-04-12 DIAGNOSIS — E119 Type 2 diabetes mellitus without complications: Secondary | ICD-10-CM | POA: Diagnosis not present

## 2019-04-12 DIAGNOSIS — D649 Anemia, unspecified: Secondary | ICD-10-CM | POA: Diagnosis not present

## 2019-04-12 DIAGNOSIS — Z8619 Personal history of other infectious and parasitic diseases: Secondary | ICD-10-CM | POA: Diagnosis not present

## 2019-04-12 DIAGNOSIS — K219 Gastro-esophageal reflux disease without esophagitis: Secondary | ICD-10-CM | POA: Diagnosis not present

## 2019-04-12 DIAGNOSIS — Z79899 Other long term (current) drug therapy: Secondary | ICD-10-CM | POA: Diagnosis not present

## 2019-04-12 DIAGNOSIS — I10 Essential (primary) hypertension: Secondary | ICD-10-CM | POA: Diagnosis not present

## 2019-04-19 DIAGNOSIS — Z79899 Other long term (current) drug therapy: Secondary | ICD-10-CM | POA: Diagnosis not present

## 2019-04-19 DIAGNOSIS — D849 Immunodeficiency, unspecified: Secondary | ICD-10-CM | POA: Diagnosis not present

## 2019-04-19 DIAGNOSIS — E119 Type 2 diabetes mellitus without complications: Secondary | ICD-10-CM | POA: Diagnosis not present

## 2019-04-19 DIAGNOSIS — D72819 Decreased white blood cell count, unspecified: Secondary | ICD-10-CM | POA: Diagnosis not present

## 2019-04-19 DIAGNOSIS — D649 Anemia, unspecified: Secondary | ICD-10-CM | POA: Diagnosis not present

## 2019-04-19 DIAGNOSIS — Z940 Kidney transplant status: Secondary | ICD-10-CM | POA: Diagnosis not present

## 2019-04-19 DIAGNOSIS — Z5181 Encounter for therapeutic drug level monitoring: Secondary | ICD-10-CM | POA: Diagnosis not present

## 2019-04-19 DIAGNOSIS — Z8619 Personal history of other infectious and parasitic diseases: Secondary | ICD-10-CM | POA: Diagnosis not present

## 2019-04-19 DIAGNOSIS — Z794 Long term (current) use of insulin: Secondary | ICD-10-CM | POA: Diagnosis not present

## 2019-04-19 DIAGNOSIS — K219 Gastro-esophageal reflux disease without esophagitis: Secondary | ICD-10-CM | POA: Diagnosis not present

## 2019-04-19 DIAGNOSIS — E872 Acidosis: Secondary | ICD-10-CM | POA: Diagnosis not present

## 2019-04-19 DIAGNOSIS — E785 Hyperlipidemia, unspecified: Secondary | ICD-10-CM | POA: Diagnosis not present

## 2019-04-19 DIAGNOSIS — I10 Essential (primary) hypertension: Secondary | ICD-10-CM | POA: Diagnosis not present

## 2019-04-19 DIAGNOSIS — B259 Cytomegaloviral disease, unspecified: Secondary | ICD-10-CM | POA: Diagnosis not present

## 2019-04-19 DIAGNOSIS — Z4822 Encounter for aftercare following kidney transplant: Secondary | ICD-10-CM | POA: Diagnosis not present

## 2019-04-19 DIAGNOSIS — E0840 Diabetes mellitus due to underlying condition with diabetic neuropathy, unspecified: Secondary | ICD-10-CM | POA: Diagnosis not present

## 2019-04-26 DIAGNOSIS — E119 Type 2 diabetes mellitus without complications: Secondary | ICD-10-CM | POA: Diagnosis not present

## 2019-04-26 DIAGNOSIS — N1832 Chronic kidney disease, stage 3b: Secondary | ICD-10-CM | POA: Diagnosis not present

## 2019-04-26 DIAGNOSIS — Z940 Kidney transplant status: Secondary | ICD-10-CM | POA: Diagnosis not present

## 2019-04-26 DIAGNOSIS — E785 Hyperlipidemia, unspecified: Secondary | ICD-10-CM | POA: Diagnosis not present

## 2019-04-26 DIAGNOSIS — Z79899 Other long term (current) drug therapy: Secondary | ICD-10-CM | POA: Diagnosis not present

## 2019-04-26 DIAGNOSIS — Z794 Long term (current) use of insulin: Secondary | ICD-10-CM | POA: Diagnosis not present

## 2019-04-26 DIAGNOSIS — K219 Gastro-esophageal reflux disease without esophagitis: Secondary | ICD-10-CM | POA: Diagnosis not present

## 2019-04-26 DIAGNOSIS — D708 Other neutropenia: Secondary | ICD-10-CM | POA: Diagnosis not present

## 2019-04-26 DIAGNOSIS — Z4822 Encounter for aftercare following kidney transplant: Secondary | ICD-10-CM | POA: Diagnosis not present

## 2019-04-26 DIAGNOSIS — Z8619 Personal history of other infectious and parasitic diseases: Secondary | ICD-10-CM | POA: Diagnosis not present

## 2019-04-26 DIAGNOSIS — Z5181 Encounter for therapeutic drug level monitoring: Secondary | ICD-10-CM | POA: Diagnosis not present

## 2019-04-26 DIAGNOSIS — I10 Essential (primary) hypertension: Secondary | ICD-10-CM | POA: Diagnosis not present

## 2019-04-26 DIAGNOSIS — E872 Acidosis: Secondary | ICD-10-CM | POA: Diagnosis not present

## 2019-04-26 DIAGNOSIS — D72819 Decreased white blood cell count, unspecified: Secondary | ICD-10-CM | POA: Diagnosis not present

## 2019-04-26 DIAGNOSIS — D849 Immunodeficiency, unspecified: Secondary | ICD-10-CM | POA: Diagnosis not present

## 2019-04-26 DIAGNOSIS — E1121 Type 2 diabetes mellitus with diabetic nephropathy: Secondary | ICD-10-CM | POA: Diagnosis not present

## 2019-05-03 DIAGNOSIS — Z5181 Encounter for therapeutic drug level monitoring: Secondary | ICD-10-CM | POA: Diagnosis not present

## 2019-05-03 DIAGNOSIS — D631 Anemia in chronic kidney disease: Secondary | ICD-10-CM | POA: Diagnosis not present

## 2019-05-03 DIAGNOSIS — N183 Chronic kidney disease, stage 3 unspecified: Secondary | ICD-10-CM | POA: Diagnosis not present

## 2019-05-03 DIAGNOSIS — Z4822 Encounter for aftercare following kidney transplant: Secondary | ICD-10-CM | POA: Diagnosis not present

## 2019-05-03 DIAGNOSIS — Z79899 Other long term (current) drug therapy: Secondary | ICD-10-CM | POA: Diagnosis not present

## 2019-05-03 DIAGNOSIS — Z940 Kidney transplant status: Secondary | ICD-10-CM | POA: Diagnosis not present

## 2019-05-05 DIAGNOSIS — E0822 Diabetes mellitus due to underlying condition with diabetic chronic kidney disease: Secondary | ICD-10-CM | POA: Diagnosis not present

## 2019-05-14 DIAGNOSIS — D72819 Decreased white blood cell count, unspecified: Secondary | ICD-10-CM | POA: Diagnosis not present

## 2019-05-14 DIAGNOSIS — I10 Essential (primary) hypertension: Secondary | ICD-10-CM | POA: Diagnosis not present

## 2019-05-14 DIAGNOSIS — Z940 Kidney transplant status: Secondary | ICD-10-CM | POA: Diagnosis not present

## 2019-05-14 DIAGNOSIS — E109 Type 1 diabetes mellitus without complications: Secondary | ICD-10-CM | POA: Diagnosis not present

## 2019-05-14 DIAGNOSIS — D8989 Other specified disorders involving the immune mechanism, not elsewhere classified: Secondary | ICD-10-CM | POA: Diagnosis not present

## 2019-05-14 DIAGNOSIS — U071 COVID-19: Secondary | ICD-10-CM | POA: Diagnosis not present

## 2019-05-17 DIAGNOSIS — Z940 Kidney transplant status: Secondary | ICD-10-CM | POA: Diagnosis not present

## 2019-05-17 DIAGNOSIS — T8610 Unspecified complication of kidney transplant: Secondary | ICD-10-CM | POA: Diagnosis not present

## 2019-05-24 DIAGNOSIS — Z940 Kidney transplant status: Secondary | ICD-10-CM | POA: Diagnosis not present

## 2019-05-24 DIAGNOSIS — D8989 Other specified disorders involving the immune mechanism, not elsewhere classified: Secondary | ICD-10-CM | POA: Diagnosis not present

## 2019-05-24 DIAGNOSIS — U071 COVID-19: Secondary | ICD-10-CM | POA: Diagnosis not present

## 2019-05-24 DIAGNOSIS — I10 Essential (primary) hypertension: Secondary | ICD-10-CM | POA: Diagnosis not present

## 2019-05-24 DIAGNOSIS — D72819 Decreased white blood cell count, unspecified: Secondary | ICD-10-CM | POA: Diagnosis not present

## 2019-05-24 DIAGNOSIS — E109 Type 1 diabetes mellitus without complications: Secondary | ICD-10-CM | POA: Diagnosis not present

## 2019-05-26 DIAGNOSIS — T8610 Unspecified complication of kidney transplant: Secondary | ICD-10-CM | POA: Diagnosis not present

## 2019-05-26 DIAGNOSIS — Z940 Kidney transplant status: Secondary | ICD-10-CM | POA: Diagnosis not present

## 2019-05-27 DIAGNOSIS — U071 COVID-19: Secondary | ICD-10-CM | POA: Diagnosis not present

## 2019-10-08 ENCOUNTER — Other Ambulatory Visit: Payer: Self-pay | Admitting: Internal Medicine

## 2019-10-08 DIAGNOSIS — Z1231 Encounter for screening mammogram for malignant neoplasm of breast: Secondary | ICD-10-CM

## 2019-10-12 ENCOUNTER — Ambulatory Visit
Admission: RE | Admit: 2019-10-12 | Discharge: 2019-10-12 | Disposition: A | Discharge status: Home / Self Care | Payer: Medicare HMO | Source: Ambulatory Visit | Attending: Internal Medicine | Admitting: Internal Medicine

## 2019-10-12 ENCOUNTER — Other Ambulatory Visit: Payer: Self-pay

## 2019-10-12 DIAGNOSIS — Z1231 Encounter for screening mammogram for malignant neoplasm of breast: Secondary | ICD-10-CM

## 2020-05-05 ENCOUNTER — Other Ambulatory Visit: Payer: Self-pay

## 2020-05-05 ENCOUNTER — Encounter: Payer: Self-pay | Admitting: Podiatry

## 2020-05-05 ENCOUNTER — Ambulatory Visit (INDEPENDENT_AMBULATORY_CARE_PROVIDER_SITE_OTHER): Payer: Medicare HMO | Admitting: Podiatry

## 2020-05-05 DIAGNOSIS — L600 Ingrowing nail: Secondary | ICD-10-CM | POA: Diagnosis not present

## 2020-05-05 NOTE — Patient Instructions (Signed)

## 2020-05-05 NOTE — Progress Notes (Signed)
Subjective:  Patient ID: Kristy Santos, female    DOB: 1950-06-20,  MRN: [id number]  Chief Complaint  Patient presents with  . Nail Problem    Left hallux nail. PT denies any soreness from the toe. Has had some bleeding .     69 y.o. female presents with the above complaint.  Patient presents with complaint of left hallux medial border ingrown.  Patient states she went to a nail salon where they dug it out too deeply is now feeling very painful.  She states the pain has gone down since it happened few weeks ago however there still some soreness present.  She would like to get evaluated she would like to have the inguinal removed.  She is a diabetic with last A1c of 7.0.  She also has kidney disease as well.  She denies any other acute complaints.   Review of Systems: Negative except as noted in the HPI. Denies N/V/F/Ch.  Past Medical History:  Diagnosis Date  . Chronic kidney disease   . Diabetes mellitus   . Hypertension     Current Outpatient Medications:  .  amLODipine (NORVASC) 5 MG tablet, Take 5 mg by mouth 2 (two) times daily., Disp: , Rfl:  .  aspirin EC 81 MG tablet, Take 81 mg by mouth daily., Disp: , Rfl:  .  calcium acetate, Phos Binder, (PHOSLYRA) 667 MG/5ML SOLN, Take 800 mg by mouth 3 (three) times daily with meals. , Disp: , Rfl:  .  calcium carbonate (OS-CAL) 1250 (500 Ca) MG chewable tablet, Chew 1 tablet by mouth daily., Disp: , Rfl:  .  cloNIDine (CATAPRES) 0.1 MG tablet, Take 0.1 mg by mouth 2 (two) times daily., Disp: , Rfl:  .  fish oil-omega-3 fatty acids 1000 MG capsule, Take 1 g by mouth daily., Disp: , Rfl:  .  furosemide (LASIX) 20 MG tablet, Take 80 mg by mouth daily. , Disp: , Rfl:  .  glyBURIDE (DIABETA) 5 MG tablet, Take 10 mg by mouth 2 (two) times daily., Disp: , Rfl: 3 .  metoprolol succinate (TOPROL-XL) 100 MG 24 hr tablet, Take 50 mg by mouth at bedtime. Take with or immediately following a meal. , Disp: , Rfl:  .  Multiple Vitamin (MULTIVITAMIN  WITH MINERALS) TABS, Take 1 tablet by mouth daily., Disp: , Rfl:  .  oxyCODONE-acetaminophen (ROXICET) 5-325 MG tablet, Take 1 tablet by mouth every 6 (six) hours as needed., Disp: 6 tablet, Rfl: 0 .  sodium bicarbonate 325 MG tablet, Take 325 mg by mouth 3 (three) times daily. , Disp: , Rfl:   Current Facility-Administered Medications:  .  aflibercept (EYLEA) SOLN 2 mg, 2 mg, Intravitreal, , Bernarda Caffey, MD, 2 mg at 07/03/18 0041 .  aflibercept (EYLEA) SOLN 2 mg, 2 mg, Intravitreal, , Bernarda Caffey, MD, 2 mg at 07/30/18 1355 .  Bevacizumab (AVASTIN) SOLN 1.25 mg, 1.25 mg, Intravitreal, , Bernarda Caffey, MD, 1.25 mg at 02/03/18 1140 .  Bevacizumab (AVASTIN) SOLN 1.25 mg, 1.25 mg, Intravitreal, , Bernarda Caffey, MD, 1.25 mg at 03/03/18 1207 .  Bevacizumab (AVASTIN) SOLN 1.25 mg, 1.25 mg, Intravitreal, , Bernarda Caffey, MD, 1.25 mg at 03/31/18 1114 .  Bevacizumab (AVASTIN) SOLN 1.25 mg, 1.25 mg, Intravitreal, , Bernarda Caffey, MD, 1.25 mg at 04/28/18 0847 .  Bevacizumab (AVASTIN) SOLN 1.25 mg, 1.25 mg, Intravitreal, , Bernarda Caffey, MD, 1.25 mg at 06/04/18 0848  Social History   Tobacco Use  Smoking Status Never Smoker  Smokeless Tobacco Never Used  Tobacco Comment   never used    Allergies  Allergen Reactions  . Bidil [Isosorb Dinitrate-Hydralazine] Shortness Of Breath    Chest pain, SOB   Objective:  There were no vitals filed for this visit. There is no height or weight on file to calculate BMI. Constitutional Well developed. Well nourished.  Vascular Dorsalis pedis pulses palpable bilaterally. Posterior tibial pulses palpable bilaterally. Capillary refill normal to all digits.  No cyanosis or clubbing noted. Pedal hair growth normal.  Neurologic Normal speech. Oriented to person, place, and time. Epicritic sensation to light touch grossly present bilaterally.  Dermatologic Painful ingrowing nail at medial nail borders of the hallux nail left No other open wounds. No skin  lesions.  Orthopedic: Normal joint ROM without pain or crepitus bilaterally. No visible deformities. No bony tenderness.   Radiographs: None Assessment:   1. Ingrown left big toenail    Plan:  Patient was evaluated and treated and all questions answered.  Ingrown Nail, right -Patient elects to proceed with minor surgery to remove ingrown toenail removal today. Consent reviewed and signed by patient. -Ingrown nail excised. See procedure note. -Educated on post-procedure care including soaking. Written instructions provided and reviewed. -Patient to follow up in 2 weeks for nail check. -Given that patient has a history of diabetes as well as underlying chronic kidney disease I believe she is a high risk of developing wound complication as well as possibly missing an amputation.  I discussed this with the patient extensive detail.  Patient would like to proceed despite the risks.  Procedure: Excision of Ingrown Toenail Location: Left 1st toe medial nail borders. Anesthesia: Lidocaine 1% plain; 1.5 mL and Marcaine 0.5% plain; 1.5 mL, digital block. Skin Prep: Betadine. Dressing: Silvadene; telfa; dry, sterile, compression dressing. Technique: Following skin prep, the toe was exsanguinated and a tourniquet was secured at the base of the toe. The affected nail border was freed, split with a nail splitter, and excised. Chemical matrixectomy was then performed with phenol and irrigated out with alcohol. The tourniquet was then removed and sterile dressing applied. Disposition: Patient tolerated procedure well. Patient to return in 2 weeks for follow-up.   No follow-ups on file.

## 2020-05-12 ENCOUNTER — Other Ambulatory Visit (HOSPITAL_COMMUNITY): Payer: Self-pay | Admitting: Internal Medicine

## 2020-05-12 MED FILL — NITROFURANTOIN MONO-MCR 100: 100 | 7 days supply | Qty: 14 | Fill #0

## 2020-06-12 ENCOUNTER — Other Ambulatory Visit (HOSPITAL_COMMUNITY): Payer: Self-pay | Admitting: Nephrology

## 2020-06-12 MED FILL — CEPHALEXIN 500 MG CAPSULE: 500 | 5 days supply | Qty: 10 | Fill #0

## 2020-11-23 ENCOUNTER — Other Ambulatory Visit: Payer: Self-pay | Admitting: Internal Medicine

## 2020-11-23 DIAGNOSIS — Z1231 Encounter for screening mammogram for malignant neoplasm of breast: Secondary | ICD-10-CM

## 2021-01-10 ENCOUNTER — Encounter (HOSPITAL_COMMUNITY): Payer: Self-pay

## 2021-01-17 ENCOUNTER — Ambulatory Visit
Admission: RE | Admit: 2021-01-17 | Discharge: 2021-01-17 | Disposition: A | Discharge status: Home / Self Care | Payer: Medicare HMO | Source: Ambulatory Visit | Attending: Internal Medicine | Admitting: Internal Medicine

## 2021-01-17 ENCOUNTER — Other Ambulatory Visit: Payer: Self-pay

## 2021-01-17 DIAGNOSIS — Z1231 Encounter for screening mammogram for malignant neoplasm of breast: Secondary | ICD-10-CM

## 2021-05-14 ENCOUNTER — Telehealth: Payer: Self-pay

## 2021-05-14 NOTE — Telephone Encounter (Signed)
HD calls today to report that patient is having severe pain at the old fistula site. 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 The pain in her arm developed over the past 1-2 days. No other complaints. Placed on MD schedule.

## 2021-05-16 ENCOUNTER — Other Ambulatory Visit: Payer: Self-pay

## 2021-05-16 ENCOUNTER — Ambulatory Visit (INDEPENDENT_AMBULATORY_CARE_PROVIDER_SITE_OTHER): Payer: Medicare HMO | Admitting: Vascular Surgery

## 2021-05-16 ENCOUNTER — Encounter: Payer: Self-pay | Admitting: Vascular Surgery

## 2021-05-16 VITALS — BP 129/74 | HR 100 | Temp 98.0°F | Resp 20 | Ht 63.0 in | Wt 186.0 lb

## 2021-05-16 DIAGNOSIS — T82848A Pain from vascular prosthetic devices, implants and grafts, initial encounter: Secondary | ICD-10-CM

## 2021-05-16 NOTE — Progress Notes (Signed)
Patient ID: Kristy Santos, female   DOB: 1950-05-30, 70 y.o.   MRN: [id number]  Reason for Consult: No chief complaint on file.   Referred by [name], MD  Subjective:     HPI:  Kristy Santos is a 70 y.o. female with previous history of end-stage renal disease she dialyzed for over 2 years until 2020 when she received the transplant.  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  Still has fistula in the left upper arm.  A few days ago she developed pain in the left upper arm radiating from the back to near the fistula.  This was initially 10 out of 10 the next day was 7 out of 10 and now is about to on a scale of 10 and is more like a toothache.  She has not had any recent interventions on the fistula.  She has no trauma to the fistula.  Past Medical History:  Diagnosis Date   Chronic kidney disease    Diabetes mellitus    Hypertension    Family History  Problem Relation Age of Onset   Cancer Mother    Diabetes Father    Past Surgical History:  Procedure Laterality Date   AV FISTULA PLACEMENT Left 09/19/2015   Procedure: Creation of Left  Arm Brachial Cephalic Arteriovenous Fistula;  Surgeon: Elam Dutch, MD;  Location: [facility];  Service: Vascular;  Laterality: Left;   BREAST BIOPSY     BREAST SURGERY  2009   right breast-benign   CATARACT EXTRACTION     COLONOSCOPY W/ BIOPSIES AND POLYPECTOMY     ENDOMETRIAL BIOPSY  12/23/2011   EYE SURGERY     MULTIPLE TOOTH EXTRACTIONS     REFRACTIVE SURGERY  2011   right eye   TOTAL ABDOMINAL HYSTERECTOMY W/ BILATERAL SALPINGOOPHORECTOMY  01/21/2012   w/ LND    Short Social History:  Social History   Tobacco Use   Smoking status: Never   Smokeless tobacco: Never   Tobacco comments:    never used  Substance Use Topics   Alcohol use: No    Allergies  Allergen Reactions   Bidil [Isosorb Dinitrate-Hydralazine] Shortness Of Breath    Chest pain, SOB    Current Outpatient Medications  Medication Sig Dispense Refill    amLODipine (NORVASC) 5 MG tablet Take 5 mg by mouth 2 (two) times daily.     aspirin EC 81 MG tablet Take 81 mg by mouth daily.     calcium acetate, Phos Binder, (PHOSLYRA) 667 MG/5ML SOLN Take 800 mg by mouth 3 (three) times daily with meals.      calcium carbonate (OS-CAL) 1250 (500 Ca) MG chewable tablet Chew 1 tablet by mouth daily.     cephALEXin (KEFLEX) 500 MG capsule TAKE 1 CAPSULE BY MOUTH TWICE DAILY. 10 capsule 0   cloNIDine (CATAPRES) 0.1 MG tablet Take 0.1 mg by mouth 2 (two) times daily.     fish oil-omega-3 fatty acids 1000 MG capsule Take 1 g by mouth daily.     furosemide (LASIX) 20 MG tablet Take 80 mg by mouth daily.      glyBURIDE (DIABETA) 5 MG tablet Take 10 mg by mouth 2 (two) times daily.  3   metoprolol succinate (TOPROL-XL) 100 MG 24 hr tablet Take 50 mg by mouth at bedtime. Take with or immediately following a meal.      Multiple Vitamin (MULTIVITAMIN WITH MINERALS) TABS Take 1 tablet by mouth daily.     oxyCODONE-acetaminophen (ROXICET)  5-325 MG tablet Take 1 tablet by mouth every 6 (six) hours as needed. 6 tablet 0   sodium bicarbonate 325 MG tablet Take 325 mg by mouth 3 (three) times daily.      Current Facility-Administered Medications  Medication Dose Route Frequency Provider Last Rate Last Admin   aflibercept (EYLEA) SOLN 2 mg  2 mg Intravitreal  Bernarda Caffey, MD   2 mg at 07/03/18 0041   aflibercept (EYLEA) SOLN 2 mg  2 mg Intravitreal  Bernarda Caffey, MD   2 mg at 07/30/18 1355   Bevacizumab (AVASTIN) SOLN 1.25 mg  1.25 mg Intravitreal  Bernarda Caffey, MD   1.25 mg at 02/03/18 1140   Bevacizumab (AVASTIN) SOLN 1.25 mg  1.25 mg Intravitreal  Bernarda Caffey, MD   1.25 mg at 03/03/18 1207   Bevacizumab (AVASTIN) SOLN 1.25 mg  1.25 mg Intravitreal  Bernarda Caffey, MD   1.25 mg at 03/31/18 1114   Bevacizumab (AVASTIN) SOLN 1.25 mg  1.25 mg Intravitreal  Bernarda Caffey, MD   1.25 mg at 04/28/18 0847   Bevacizumab (AVASTIN) SOLN 1.25 mg  1.25 mg Intravitreal  Bernarda Caffey, MD   1.25 mg at 06/04/18 0848    Review of Systems  Constitutional:  Constitutional negative. HENT: HENT negative.  Eyes: Eyes negative.  Respiratory: Respiratory negative.  Cardiovascular: Cardiovascular negative.  GI: Gastrointestinal negative.  Musculoskeletal:       Arm pain Neurological: Neurological negative. Hematologic: Hematologic/lymphatic negative.  Psychiatric: Psychiatric negative.       Objective:  Objective  Vitals:   05/16/21 1336  BP: 129/74  Pulse: 100  Resp: 20  Temp: 98 F (36.7 C)  SpO2: 98%    Physical Exam HENT:     Head: Normocephalic.     Nose:     Comments: Wearing a mask Eyes:     Pupils: Pupils are equal, round, and reactive to light.  Cardiovascular:     Rate and Rhythm: Normal rate.  Pulmonary:     Effort: Pulmonary effort is normal.  Abdominal:     General: Abdomen is flat.     Palpations: Abdomen is soft.  Musculoskeletal:        General: Normal range of motion.     Cervical back: Normal range of motion.     Right lower leg: No edema.     Left lower leg: No edema.     Comments: Palpable thrill left upper extremity fistula  Skin:    Capillary Refill: Capillary refill takes less than 2 seconds.  Neurological:     General: No focal deficit present.     Mental Status: She is alert.  Psychiatric:        Mood and Affect: Mood normal.        Behavior: Behavior normal.        Thought Content: Thought content normal.    Data: No studies today     Assessment/Plan:    70 year old female with functioning renal transplant previous on dialysis via left brachial artery cephalic fistula.  She has had pain in the left upper arm I am not entirely sure was related to the fistula but either way is improving and there is no outward ominous signs by physical exam.  This time I think we are okay to keep the fistula.  If she has continued pain we can consider ligation in the future.  She will otherwise follow-up with me on an as-needed  basis.     Waynetta Sandy  MD [facility]

## 2021-10-11 IMAGING — MG MM DIGITAL SCREENING BILAT W/ TOMO AND CAD
6 of 10 series · 6 of 30 positions shown · non-contrast
Comparison: Previous exam(s).

CLINICAL DATA: Screening.

EXAM:
DIGITAL SCREENING BILATERAL MAMMOGRAM WITH TOMOSYNTHESIS AND CAD
TECHNIQUE: Bilateral screening digital craniocaudal and mediolateral oblique
mammograms were obtained. Bilateral screening digital breast
tomosynthesis was performed. The images were evaluated with
computer-aided detection.

[R CC synth-2D (1 of 2)]
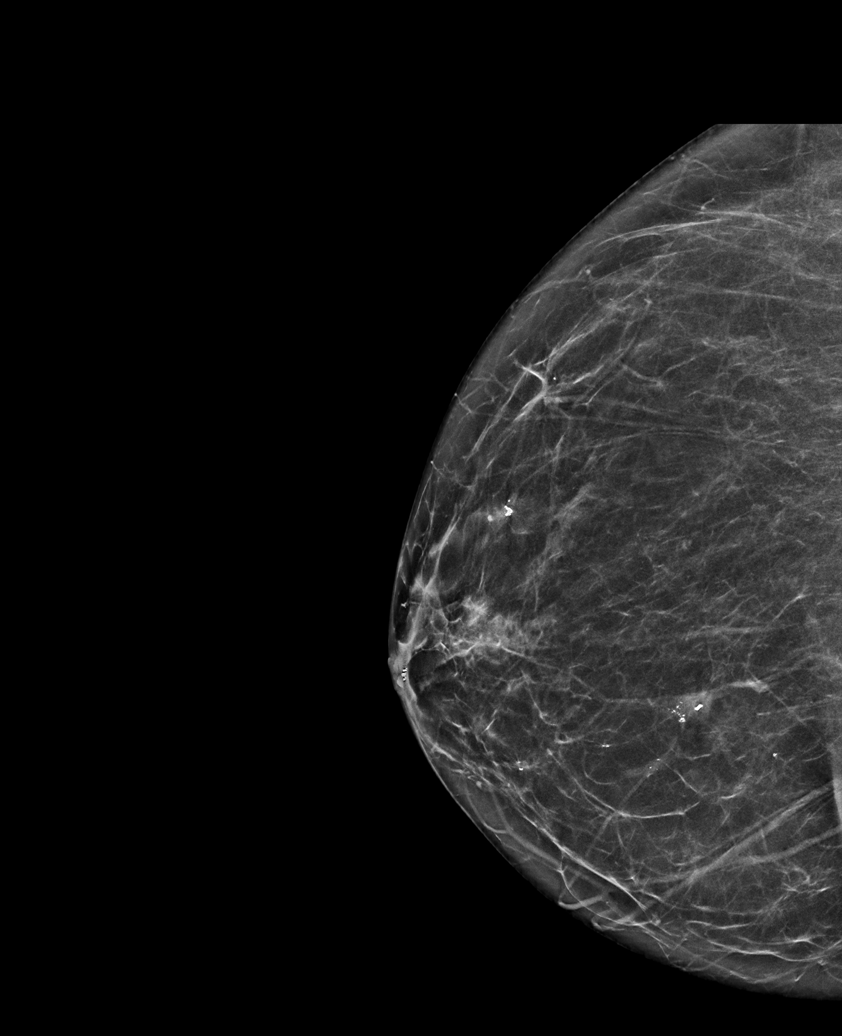

[R CC synth-2D (2 of 2)]
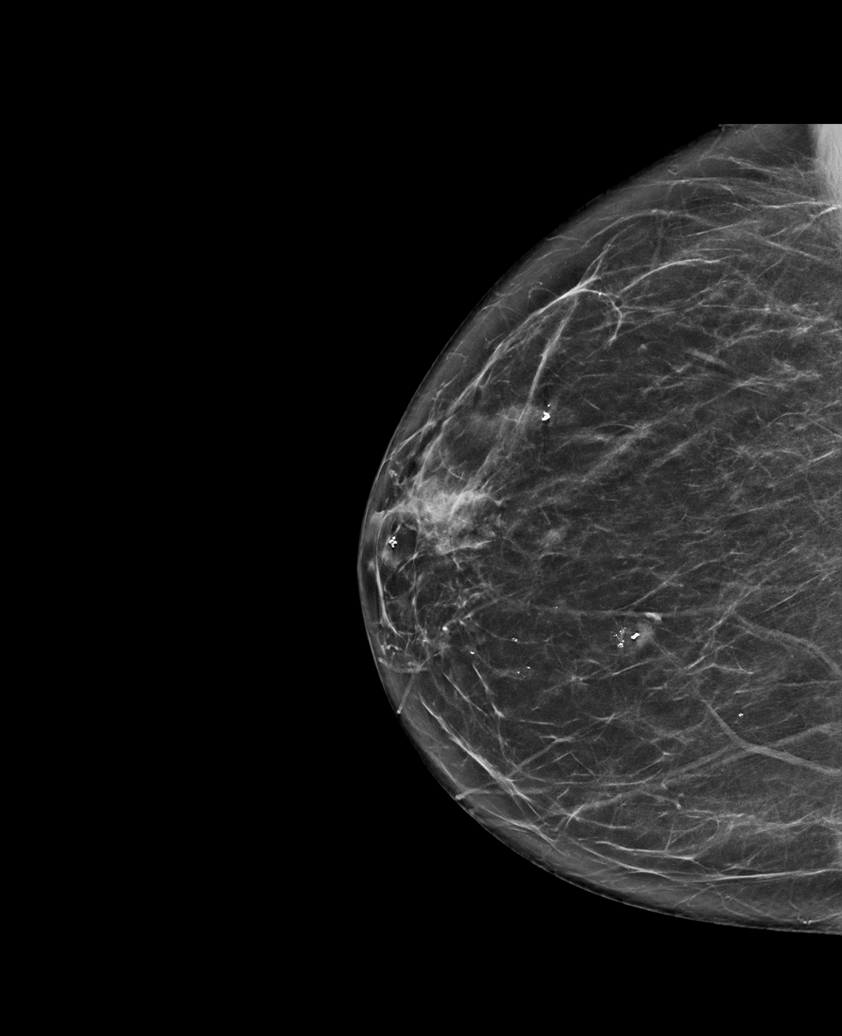

[L MLO synth-2D]
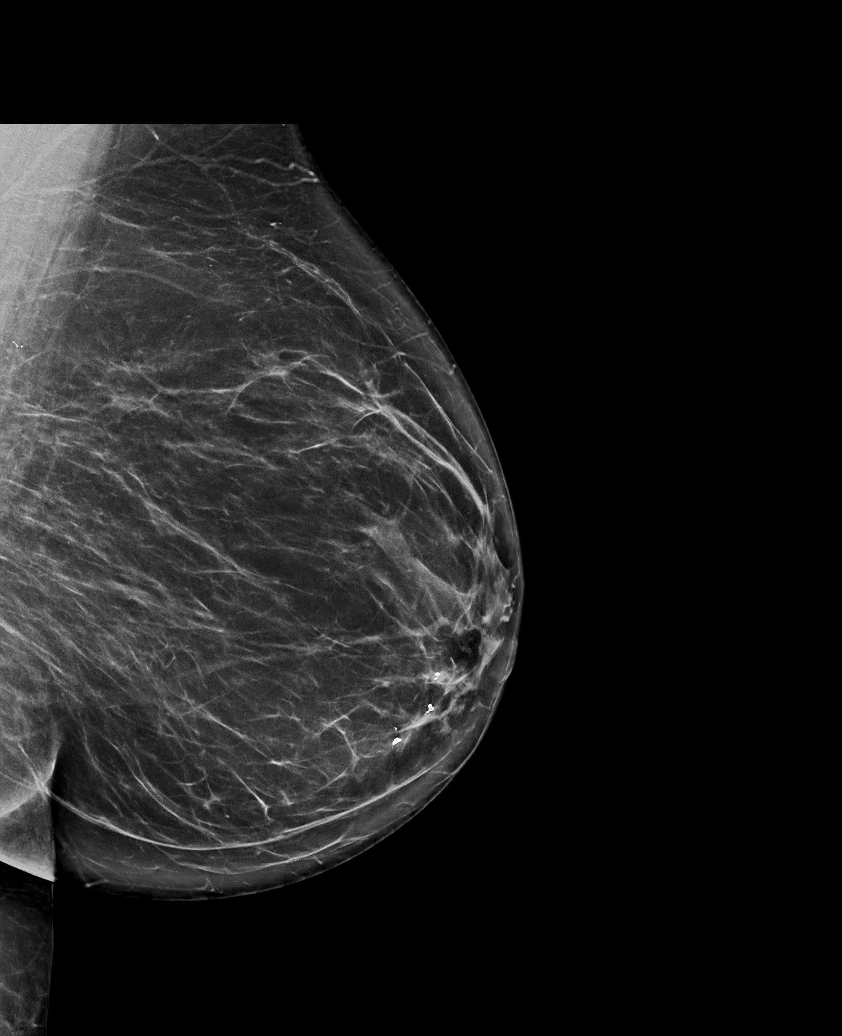

[L CC synth-2D]
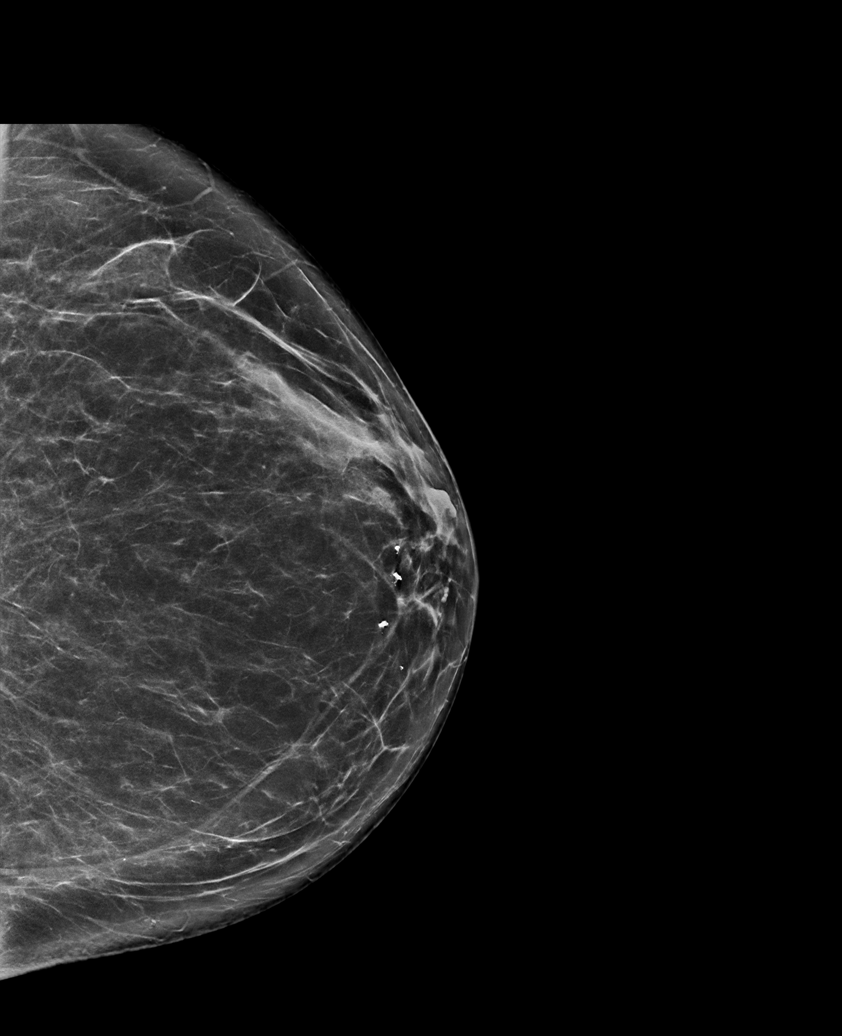

[R MLO synth-2D]
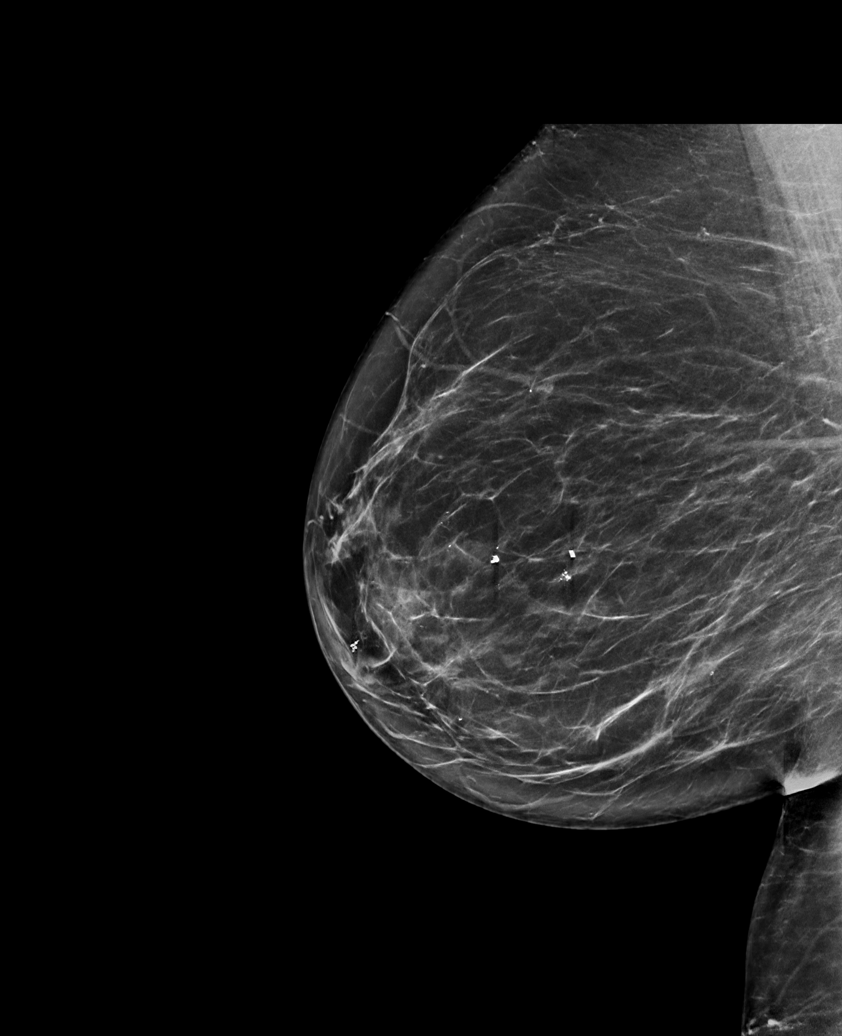

[R CC tomo · tomo slice 36/71.0]
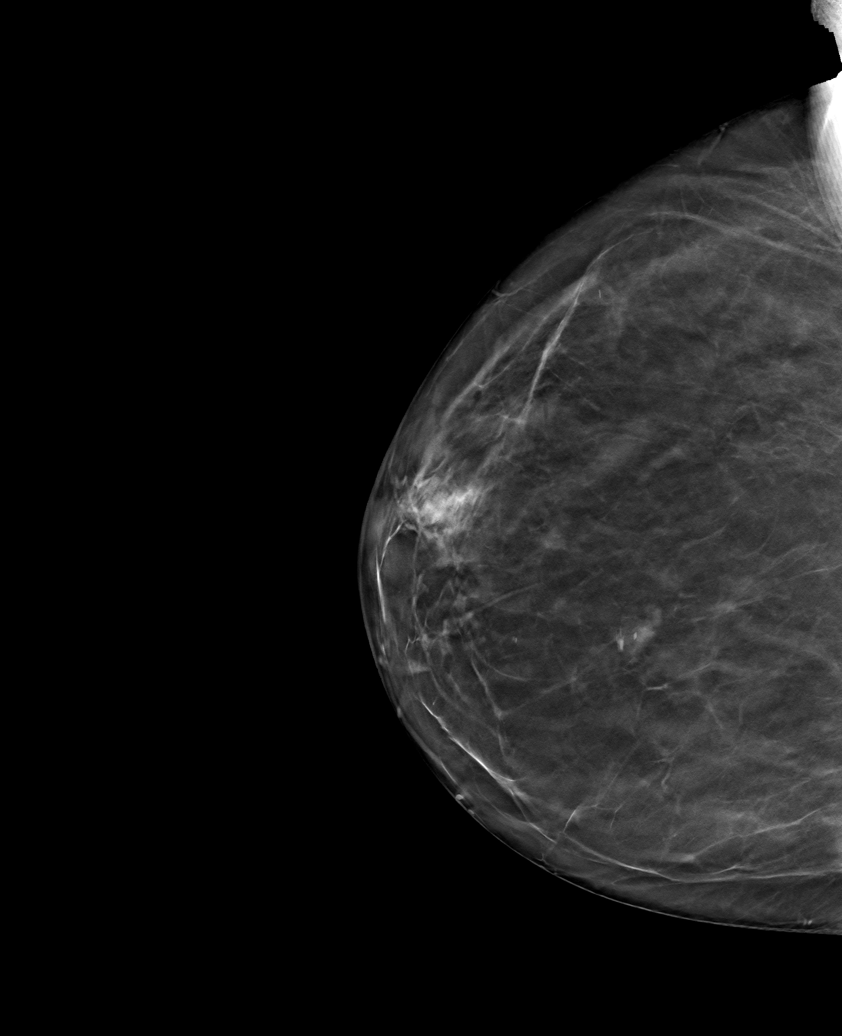

[6 of 30 positions shown; findings below may reference images not displayed]

ACR Breast Density Category b: There are scattered areas of
fibroglandular density.
FINDINGS: There are no findings suspicious for malignancy.
IMPRESSION: No mammographic evidence of malignancy. A result letter of this
screening mammogram will be mailed directly to the patient.

RECOMMENDATION:
Screening mammogram in one year. (Code:51-O-LD2)

BI-RADS CATEGORY  1: Negative.

## 2021-11-22 ENCOUNTER — Encounter (HOSPITAL_COMMUNITY): Payer: Self-pay

## 2021-11-22 ENCOUNTER — Other Ambulatory Visit (HOSPITAL_COMMUNITY): Payer: Self-pay

## 2021-11-22 MED ORDER — MOUNJARO 5 MG/0.5ML ~~LOC~~ SOAJ
5.0000 mg | SUBCUTANEOUS | 3 refills | Status: DC
Start: 2021-11-22 — End: 2022-05-06
  Filled 2021-11-22 – 2022-01-15 (×4): qty 2, 28d supply, fill #0
  Filled 2022-02-13: qty 2, 28d supply, fill #1
  Filled 2022-03-11: qty 2, 28d supply, fill #2
  Filled 2022-04-08: qty 2, 28d supply, fill #3

## 2021-12-14 ENCOUNTER — Other Ambulatory Visit (HOSPITAL_COMMUNITY): Payer: Self-pay

## 2022-01-01 ENCOUNTER — Other Ambulatory Visit: Payer: Self-pay | Admitting: Internal Medicine

## 2022-01-01 DIAGNOSIS — Z1231 Encounter for screening mammogram for malignant neoplasm of breast: Secondary | ICD-10-CM

## 2022-01-15 ENCOUNTER — Other Ambulatory Visit (HOSPITAL_COMMUNITY): Payer: Self-pay

## 2022-01-22 ENCOUNTER — Ambulatory Visit
Admission: RE | Admit: 2022-01-22 | Discharge: 2022-01-22 | Disposition: A | Discharge status: Home / Self Care | Payer: Medicare HMO | Source: Ambulatory Visit | Attending: Internal Medicine | Admitting: Internal Medicine

## 2022-01-22 DIAGNOSIS — Z1231 Encounter for screening mammogram for malignant neoplasm of breast: Secondary | ICD-10-CM

## 2022-02-13 ENCOUNTER — Other Ambulatory Visit (HOSPITAL_COMMUNITY): Payer: Self-pay

## 2022-03-11 ENCOUNTER — Other Ambulatory Visit (HOSPITAL_COMMUNITY): Payer: Self-pay

## 2022-04-08 ENCOUNTER — Other Ambulatory Visit (HOSPITAL_COMMUNITY): Payer: Self-pay

## 2022-05-06 ENCOUNTER — Other Ambulatory Visit (HOSPITAL_COMMUNITY): Payer: Self-pay

## 2022-05-06 MED ORDER — MOUNJARO 5 MG/0.5ML ~~LOC~~ SOAJ
5.0000 mg | SUBCUTANEOUS | 3 refills | Status: DC
  Filled 2022-05-06: qty 2, 28d supply, fill #0
  Filled 2022-06-03 – 2022-09-30 (×3): qty 2, 28d supply, fill #1
  Filled 2022-10-28: qty 2, 28d supply, fill #2
  Filled 2022-11-25: qty 2, 28d supply, fill #3

## 2022-06-03 ENCOUNTER — Encounter (HOSPITAL_COMMUNITY): Payer: Self-pay

## 2022-06-03 ENCOUNTER — Other Ambulatory Visit (HOSPITAL_COMMUNITY): Payer: Self-pay

## 2022-06-04 ENCOUNTER — Other Ambulatory Visit (HOSPITAL_COMMUNITY): Payer: Self-pay

## 2022-06-04 MED ORDER — MOUNJARO 5 MG/0.5ML ~~LOC~~ SOAJ
5.0000 mg | SUBCUTANEOUS | 3 refills | Status: AC
  Filled 2022-06-04 – 2022-06-07 (×3): qty 2, 28d supply, fill #0
  Filled 2022-07-09: qty 2, 28d supply, fill #1
  Filled 2022-08-02: qty 2, 28d supply, fill #2
  Filled 2022-08-29: qty 2, 28d supply, fill #3

## 2022-06-05 ENCOUNTER — Other Ambulatory Visit (HOSPITAL_COMMUNITY): Payer: Self-pay

## 2022-06-07 ENCOUNTER — Other Ambulatory Visit (HOSPITAL_COMMUNITY): Payer: Self-pay

## 2022-07-09 ENCOUNTER — Other Ambulatory Visit (HOSPITAL_COMMUNITY): Payer: Self-pay

## 2022-08-02 ENCOUNTER — Other Ambulatory Visit (HOSPITAL_COMMUNITY): Payer: Self-pay

## 2022-08-29 ENCOUNTER — Other Ambulatory Visit (HOSPITAL_COMMUNITY): Payer: Self-pay

## 2022-09-30 ENCOUNTER — Other Ambulatory Visit (HOSPITAL_COMMUNITY): Payer: Self-pay

## 2022-10-02 ENCOUNTER — Other Ambulatory Visit (HOSPITAL_COMMUNITY): Payer: Self-pay

## 2022-10-03 ENCOUNTER — Other Ambulatory Visit (HOSPITAL_COMMUNITY): Payer: Self-pay

## 2022-10-28 ENCOUNTER — Other Ambulatory Visit (HOSPITAL_COMMUNITY): Payer: Self-pay

## 2022-11-25 ENCOUNTER — Other Ambulatory Visit (HOSPITAL_COMMUNITY): Payer: Self-pay

## 2022-12-23 ENCOUNTER — Other Ambulatory Visit (HOSPITAL_COMMUNITY): Payer: Self-pay

## 2022-12-23 MED ORDER — MOUNJARO 5 MG/0.5ML ~~LOC~~ SOAJ
5.0000 mg | SUBCUTANEOUS | 3 refills | Status: DC
  Filled 2022-12-23: qty 2, 28d supply, fill #0
  Filled 2023-01-17 (×2): qty 2, 28d supply, fill #1
  Filled 2023-02-13: qty 2, 28d supply, fill #2
  Filled 2023-03-20: qty 2, 28d supply, fill #3

## 2023-01-13 ENCOUNTER — Other Ambulatory Visit: Payer: Self-pay | Admitting: Internal Medicine

## 2023-01-13 DIAGNOSIS — Z1231 Encounter for screening mammogram for malignant neoplasm of breast: Secondary | ICD-10-CM

## 2023-01-17 ENCOUNTER — Other Ambulatory Visit (HOSPITAL_COMMUNITY): Payer: Self-pay

## 2023-01-30 ENCOUNTER — Ambulatory Visit
Admission: RE | Admit: 2023-01-30 | Discharge: 2023-01-30 | Disposition: A | Discharge status: Home / Self Care | Payer: Medicare HMO | Source: Ambulatory Visit | Attending: Internal Medicine | Admitting: Internal Medicine

## 2023-01-30 DIAGNOSIS — Z1231 Encounter for screening mammogram for malignant neoplasm of breast: Secondary | ICD-10-CM

## 2023-02-13 ENCOUNTER — Other Ambulatory Visit (HOSPITAL_COMMUNITY): Payer: Self-pay

## 2023-03-20 ENCOUNTER — Other Ambulatory Visit (HOSPITAL_COMMUNITY): Payer: Self-pay

## 2023-04-22 ENCOUNTER — Other Ambulatory Visit (HOSPITAL_COMMUNITY): Payer: Self-pay

## 2023-04-23 ENCOUNTER — Other Ambulatory Visit (HOSPITAL_COMMUNITY): Payer: Self-pay

## 2023-04-28 ENCOUNTER — Other Ambulatory Visit (HOSPITAL_COMMUNITY): Payer: Self-pay

## 2023-04-28 MED ORDER — MOUNJARO 5 MG/0.5ML ~~LOC~~ SOAJ
5.0000 mg | SUBCUTANEOUS | 5 refills | Status: DC
  Filled 2023-04-28: qty 2, 28d supply, fill #0
  Filled 2023-05-26: qty 2, 28d supply, fill #1
  Filled 2023-06-23: qty 2, 28d supply, fill #2
  Filled 2023-07-21: qty 2, 28d supply, fill #3
  Filled 2023-08-18: qty 2, 28d supply, fill #4
  Filled 2023-09-12: qty 2, 28d supply, fill #5

## 2023-04-29 ENCOUNTER — Other Ambulatory Visit (HOSPITAL_COMMUNITY): Payer: Self-pay

## 2023-04-30 ENCOUNTER — Other Ambulatory Visit (HOSPITAL_COMMUNITY): Payer: Self-pay

## 2023-05-26 ENCOUNTER — Other Ambulatory Visit (HOSPITAL_COMMUNITY): Payer: Self-pay

## 2023-06-23 ENCOUNTER — Other Ambulatory Visit (HOSPITAL_COMMUNITY): Payer: Self-pay

## 2023-07-21 ENCOUNTER — Other Ambulatory Visit (HOSPITAL_COMMUNITY): Payer: Self-pay

## 2023-08-18 ENCOUNTER — Other Ambulatory Visit (HOSPITAL_COMMUNITY): Payer: Self-pay

## 2023-09-12 ENCOUNTER — Other Ambulatory Visit (HOSPITAL_COMMUNITY): Payer: Self-pay

## 2023-10-13 ENCOUNTER — Other Ambulatory Visit (HOSPITAL_COMMUNITY): Payer: Self-pay

## 2023-10-13 MED ORDER — MOUNJARO 5 MG/0.5ML ~~LOC~~ SOAJ
5.0000 mg | SUBCUTANEOUS | 5 refills | Status: DC
  Filled 2023-10-13: qty 2, 28d supply, fill #0
  Filled 2023-11-10: qty 2, 28d supply, fill #1
  Filled 2023-12-05: qty 2, 28d supply, fill #2
  Filled 2024-01-02: qty 2, 28d supply, fill #3
  Filled 2024-01-30: qty 2, 28d supply, fill #4
  Filled 2024-03-01: qty 2, 28d supply, fill #5

## 2023-11-10 ENCOUNTER — Other Ambulatory Visit (HOSPITAL_COMMUNITY): Payer: Self-pay

## 2023-12-05 ENCOUNTER — Other Ambulatory Visit (HOSPITAL_COMMUNITY): Payer: Self-pay

## 2024-01-02 ENCOUNTER — Other Ambulatory Visit (HOSPITAL_COMMUNITY): Payer: Self-pay

## 2024-01-19 ENCOUNTER — Other Ambulatory Visit: Payer: Self-pay | Admitting: Internal Medicine

## 2024-01-19 DIAGNOSIS — Z1231 Encounter for screening mammogram for malignant neoplasm of breast: Secondary | ICD-10-CM

## 2024-01-30 ENCOUNTER — Other Ambulatory Visit (HOSPITAL_COMMUNITY): Payer: Self-pay

## 2024-02-05 ENCOUNTER — Ambulatory Visit
Admission: RE | Admit: 2024-02-05 | Discharge: 2024-02-05 | Disposition: A | Discharge status: Home / Self Care | Source: Ambulatory Visit | Attending: Internal Medicine | Admitting: Internal Medicine

## 2024-02-05 DIAGNOSIS — Z1231 Encounter for screening mammogram for malignant neoplasm of breast: Secondary | ICD-10-CM

## 2024-03-01 ENCOUNTER — Other Ambulatory Visit (HOSPITAL_COMMUNITY): Payer: Self-pay

## 2024-03-01 ENCOUNTER — Encounter (HOSPITAL_COMMUNITY): Payer: Self-pay

## 2024-03-02 NOTE — Progress Notes (Signed)
 [facility] - Clinic Note  03/08/2024     CHIEF COMPLAINT Patient presents for Retina Follow Up   HISTORY OF PRESENT ILLNESS: Kristy Santos is a 73 y.o. female who presents to the clinic today for:   HPI     Retina Follow Up   Patient presents with  Diabetic Retinopathy.  In both eyes.  This started 5 years ago.  Duration of 5 years.  Since onset it is gradually worsening.  I, the attending physician,  performed the HPI with the patient and updated documentation appropriately.        Comments   5 year retina follow up PDR OU pt is reporting vision is blurred at times she denies any flashes or floaters her last reading was 120 A1C 6.0       Last edited by Valdemar Rogue, MD on 03/11/2024 12:53 AM.     Patient states  Referring physician: Larnell Hamilton, MD [address] [place],  [place] [address]  HISTORICAL INFORMATION:   Selected notes from the MEDICAL RECORD NUMBER Referred by Dr. Hamilton L. Holwerda for DM exam LEE:  Ocular Hx-pseudo OU PMH-DM (last A1C 6.4, taking Glyburide), heart disease, HTN    CURRENT MEDICATIONS: No current outpatient medications on file. (Ophthalmic Drugs)   Current Facility-Administered Medications (Ophthalmic Drugs)  Medication Route   aflibercept  (EYLEA ) SOLN 2 mg Intravitreal   aflibercept  (EYLEA ) SOLN 2 mg Intravitreal   Current Outpatient Medications (Other)  Medication Sig   amLODipine  (NORVASC ) 5 MG tablet Take 5 mg by mouth 2 (two) times daily.   aspirin EC 81 MG tablet Take 81 mg by mouth daily.   calcium acetate, Phos Binder, (PHOSLYRA) 667 MG/5ML SOLN Take 800 mg by mouth 3 (three) times daily with meals.    calcium carbonate (OS-CAL) 1250 (500 Ca) MG chewable tablet Chew 1 tablet by mouth daily.   cloNIDine (CATAPRES) 0.1 MG tablet Take 0.1 mg by mouth 2 (two) times daily.   fish oil-omega-3 fatty acids 1000 MG capsule Take 1 g by mouth daily.   furosemide  (LASIX ) 20 MG tablet Take 80 mg by mouth  daily.    glyBURIDE (DIABETA) 5 MG tablet Take 10 mg by mouth 2 (two) times daily.   metoprolol  succinate (TOPROL -XL) 100 MG 24 hr tablet Take 50 mg by mouth at bedtime. Take with or immediately following a meal.    Multiple Vitamin (MULTIVITAMIN WITH MINERALS) TABS Take 1 tablet by mouth daily.   tirzepatide  (MOUNJARO ) 5 MG/0.5ML Pen Inject 5 mg into the skin once a week.   tirzepatide  (MOUNJARO ) 5 MG/0.5ML Pen Inject 5 mg into the skin once a week.   Current Facility-Administered Medications (Other)  Medication Route   Bevacizumab  (AVASTIN ) SOLN 1.25 mg Intravitreal   Bevacizumab  (AVASTIN ) SOLN 1.25 mg Intravitreal   Bevacizumab  (AVASTIN ) SOLN 1.25 mg Intravitreal   Bevacizumab  (AVASTIN ) SOLN 1.25 mg Intravitreal   Bevacizumab  (AVASTIN ) SOLN 1.25 mg Intravitreal   REVIEW OF SYSTEMS: ROS   Positive for: Endocrine, Eyes Negative for: Constitutional, Gastrointestinal, Neurological, Skin, Genitourinary, Musculoskeletal, HENT, Cardiovascular, Respiratory, Psychiatric, Allergic/Imm, Heme/Lymph Last edited by Resa Delon ORN, COT on 03/08/2024  7:57 AM.     ALLERGIES Allergies  Allergen Reactions   Bidil [Isosorb Dinitrate-Hydralazine ] Shortness Of Breath    Chest pain, SOB   PAST MEDICAL HISTORY Past Medical History:  Diagnosis Date   Chronic kidney disease    Diabetes mellitus    Hypertension    Past Surgical History:  Procedure  Laterality Date   AV FISTULA PLACEMENT Left 09/19/2015   Procedure: Creation of Left  Arm Brachial Cephalic Arteriovenous Fistula;  Surgeon: Carlin FORBES Haddock, MD;  Location: [facility] OR;  Service: Vascular;  Laterality: Left;   BREAST BIOPSY     BREAST SURGERY  2009   right breast-benign   CATARACT EXTRACTION     COLONOSCOPY W/ BIOPSIES AND POLYPECTOMY     ENDOMETRIAL BIOPSY  12/23/2011   EYE SURGERY     MULTIPLE TOOTH EXTRACTIONS     REFRACTIVE SURGERY  2011   right eye   TOTAL ABDOMINAL HYSTERECTOMY W/ BILATERAL SALPINGOOPHORECTOMY  01/21/2012    w/ LND    FAMILY HISTORY Family History  Problem Relation Age of Onset   Cancer Mother    Diabetes Father     SOCIAL HISTORY Social History   Tobacco Use   Smoking status: Never   Smokeless tobacco: Never   Tobacco comments:    never used  Vaping Use   Vaping status: Never Used  Substance Use Topics   Alcohol use: No   Drug use: No       OPHTHALMIC EXAM:   Base Eye Exam     Visual Acuity (Snellen - Linear)       Right Left   Dist [place] 20/40 20/30 -2   Dist ph  NI NI         Tonometry (Tonopen, 8:05 AM)       Right Left   Pressure 18 16  Squeezing         Pupils       Pupils Dark Light Shape React APD   Right PERRL 4 3 Round Brisk None   Left PERRL 4 3 Round Brisk None         Visual Fields       Left Right    Full Full         Extraocular Movement       Right Left    Full, Ortho Full, Ortho         Neuro/Psych     Oriented x3: Yes   Mood/Affect: Normal         Dilation     Both eyes: 2.5% Phenylephrine @ 8:05 AM           Slit Lamp and Fundus Exam     Slit Lamp Exam       Right Left   Lids/Lashes Dermatochalasis - upper lid, mild Meibomian gland dysfunction Dermatochalasis - upper lid   Conjunctiva/Sclera Mild Melanosis, Nasal Pinguecula Melanosis, Temporal Pinguecula   Cornea Arcus, Temporal Well healed cataract wounds, Debris in tear film Arcus, Temporal Well healed cataract wounds   Anterior Chamber Deep and quiet Deep and quiet   Iris Round and dilated, No NVI Round and dilated, No NVI   Lens PC IOL in good position, trace PCO PC IOL in good position   Anterior Vitreous post vitrectomy, fibrosis along distal ST arcade, Vitreous syneresis Vitreous syneresis         Fundus Exam       Right Left   Disc Sharp, trace Pallor, Compact fibrosis overlying disc, Temporal Peripapillary atrophy   C/D Ratio 0.2 obscured   Macula Blunted foveal reflex, +cystic changes slightly increased, scattered DBH, scattered  focal laser scars, Epiretinal membrane, cluster of IRH temporal to fovea, Retinal pigment epithelial mottling Blunted foveal reflex, Epiretinal membrane, scattered DBH, focal laser scars, small macroaneurysm along IT arcade, + edema nasal mac  Vessels Vascular attenuation, Copper wiring, AV crossing changes, mildly Tortuous, fibrosis along distal ST arcade -- stable Vascular attenuation, Copper wiring, AV crossing changes, mildly Tortuous, fibrosis overlying distal ST arcade, ?release IT arcades   Periphery Attached, dense 360 PRP and good fill-in, scattered patches of fibrosis Attached, scattered pattern 360 PRP in place with good fill-in, scattered fibrosis            IMAGING AND PROCEDURES  Imaging and Procedures for @TODAY @  OCT, Retina - OU - Both Eyes       Right Eye Quality was good. Central Foveal Thickness: 239. Progression has worsened. Findings include no SRF, abnormal foveal contour, retinal drusen , intraretinal fluid, outer retinal atrophy (Interval increase IRF/edema -- greatest IN macula).   Left Eye Quality was good. Central Foveal Thickness: 212. Progression has worsened. Findings include no SRF, abnormal foveal contour, outer retinal atrophy (+IRF/edema nasal fovea and macula, vitreous traction at disc and distal arcades).   Notes *Images captured and stored on drive  Diagnosis / Impression:  OD: irregular foveal contour and lamination; +IRF/DME -- Interval increase IRF/edema -- greatest IN macula OS: +IRF/edema nasal fovea and macula, vitreous traction at disc and distal arcades -- stable from prior  Clinical management:  See below  Abbreviations: NFP - Normal foveal profile. CME - cystoid macular edema. PED - pigment epithelial detachment. IRF - intraretinal fluid. SRF - subretinal fluid. EZ - ellipsoid zone. ERM - epiretinal membrane. ORA - outer retinal atrophy. ORT - outer retinal tubulation. SRHM - subretinal hyper-reflective material       Intravitreal  Injection, Pharmacologic Agent - OD - Right Eye       Time Out 03/08/2024. 8:50 AM. Confirmed correct patient, procedure, site, and patient consented.   Anesthesia Topical anesthesia was used. Anesthetic medications included Lidocaine  2%, Proparacaine 0.5%.   Procedure Preparation included 5% betadine to ocular surface, eyelid speculum. A supplied needle was used.   Injection: 1.25 mg Bevacizumab  1.25mg /0.8ml   Route: Intravitreal, Site: Right Eye   NDC: C2662926, Lot: [id number], Expiration date: 03/21/2024   Post-op Post injection exam found visual acuity of at least counting fingers. The patient tolerated the procedure well. There were no complications. The patient received written and verbal post procedure care education.      Intravitreal Injection, Pharmacologic Agent - OS - Left Eye       Time Out 03/08/2024. 8:52 AM. Confirmed correct patient, procedure, site, and patient consented.   Anesthesia Topical anesthesia was used. Anesthetic medications included Lidocaine  2%, Proparacaine 0.5%.   Procedure Preparation included 5% betadine to ocular surface, eyelid speculum. A (32g) needle was used.   Injection: 1.25 mg Bevacizumab  1.25mg /0.40ml   Route: Intravitreal, Site: Left Eye   NDC: C2662926, Lot: [id number], Expiration date: 06/05/2024   Post-op Post injection exam found visual acuity of at least counting fingers. The patient tolerated the procedure well. There were no complications. The patient received written and verbal post procedure care education.            ASSESSMENT/PLAN:    ICD-10-CM   1. Proliferative diabetic retinopathy of both eyes with macular edema associated with type 2 diabetes mellitus (HCC)  E11.3513 OCT, Retina - OU - Both Eyes    Intravitreal Injection, Pharmacologic Agent - OD - Right Eye    Intravitreal Injection, Pharmacologic Agent - OS - Left Eye    Bevacizumab  (AVASTIN ) SOLN 1.25 mg    Bevacizumab  (AVASTIN ) SOLN 1.25 mg    2.  Encounter for long-term (  current) use of insulin  (HCC)  Z79.4     3. Essential hypertension  I10     4. Hypertensive retinopathy of both eyes  H35.033     5. Pseudophakia  Z96.1     6. Epiretinal membrane, left eye  H35.372     7. 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 Proliferative diabetic retinopathy w/ mild DME, OU - former pt of Dr. Jarold and Dr. Lilian  Records obtained from [facility]  Dr. Lilian -- [facility] 2008-2009  Dr. Jarold -- PPV, laser for [facility],  8.19.10    Focal laser 2.25.11 - exam shows preretinal fibrosis OU (OS > OD) - FA (8.13.19) with low signal, but shows late focal peripheral leakage OU - OCT OD: irregular foveal contour and lamination; +IRF/DME -- Interval increase IRF/edema -- greatest IN macula, OS: +IRF/edema nasal fovea and macula, vitreous traction at disc and distal arcades -- stable from prior - S/P PRP fill in OS (08.13.19)  - S/P PRP fill in OD (08.27.19) - S/P IVA OD (09.10.19), #2 (10.08.19), #3 (11.05.19), #4 (12.03.19), #5 (01.09.20) ============== - s/p IVE OD #1 (02.06.20), #2 (03.05.20) - BCVA 20/40 from 20/30 OD, 20/30 from 20/20 OS - recommend IVA OD #6 & IVA OS #1 today 10.13.25 with f/u in 4 weeks - pt wishes to proceed with IVA - RBA of procedure discussed, questions answered - IVA informed consent obtained and signed (OU) 10.13.25 - see procedure note - f/u 4 weeks for DFE/OCT/possible injection, FA transit OD  3. 4. Hypertensive retinopathy OU - discussed importance of tight BP control - monitor  5. Pseudophakia OU  - s/p CE/IOL OU -- pt unable to recall when and with who  - doing well  - monitor  6. Epiretinal membrane, OS - The natural history, anatomy, potential for loss of vision, and treatment options including vitrectomy techniques and the complications of endophthalmitis, retinal detachment, vitreous hemorrhage, cataract progression and  permanent vision loss discussed with the patient. - mild ERM over macula - monitor  7. Preretinal fibrosis OU - OD: mild patches in periphery - OS: significant focal areas over disc and along distal temporal arcades -- causing traction and IRF - stable OU - monitor for now -- may require surgical intervention / peeling eventually, but BCVA 20/40 OD and 20/30 OS  Ophthalmic Meds Ordered this visit:  Meds ordered this encounter  Medications   Bevacizumab  (AVASTIN ) SOLN 1.25 mg   Bevacizumab  (AVASTIN ) SOLN 1.25 mg     Return in about 4 weeks (around 04/05/2024) for f/u, PDR, DFE, OCT, Possible, FA, Transit OD, IVA, OU.  There are no Patient Instructions on file for this visit.  This document serves as a record of services personally performed by [place] JUDITHANN Hans, MD, PhD. It was created on their behalf by Avelina Pereyra, COA an ophthalmic technician. The creation of this record is the provider's dictation and/or activities during the visit.   Electronically signed by: Avelina GORMAN Pereyra, COT  03/13/24  6:22 PM   This document serves as a record of services personally performed by Redell JUDITHANN Hans, MD, PhD. It was created on their behalf by Wanda GEANNIE Keens, COT an ophthalmic technician. The creation of this record is the provider's dictation and/or activities during the visit.    Electronically signed by:  Wanda GEANNIE Keens, COT  03/13/24 6:22 PM   [place]ell JUDITHANN Hans, M.D., Ph.D.  Diseases & Surgery of the Retina and Vitreous [facility]  I have reviewed the above documentation for accuracy and completeness, and I agree with the above. Redell JUDITHANN Hans, M.D., Ph.D. 03/13/24 6:25 PM   Abbreviations: M myopia (nearsighted); A astigmatism; H hyperopia (farsighted); P presbyopia; Mrx spectacle prescription;  CTL contact lenses; OD right eye; OS left eye; OU both eyes  XT exotropia; ET esotropia; PEK punctate epithelial keratitis; PEE punctate epithelial erosions; DES  dry eye syndrome; MGD meibomian gland dysfunction; ATs artificial tears; PFAT's preservative free artificial tears; NSC nuclear sclerotic cataract; PSC posterior subcapsular cataract; ERM epi-retinal membrane; PVD posterior vitreous detachment; RD retinal detachment; DM diabetes mellitus; DR diabetic retinopathy; NPDR non-proliferative diabetic retinopathy; PDR proliferative diabetic retinopathy; CSME clinically significant macular edema; DME diabetic macular edema; dbh dot blot hemorrhages; CWS cotton wool spot; POAG primary open angle glaucoma; C/D cup-to-disc ratio; HVF humphrey visual field; GVF goldmann visual field; OCT optical coherence tomography; IOP intraocular pressure; BRVO Branch retinal vein occlusion; CRVO central retinal vein occlusion; CRAO central retinal artery occlusion; BRAO branch retinal artery occlusion; RT retinal tear; SB scleral buckle; PPV pars plana vitrectomy; VH Vitreous hemorrhage; PRP panretinal laser photocoagulation; IVK intravitreal kenalog; VMT vitreomacular traction; MH Macular hole;  NVD neovascularization of the disc; NVE neovascularization elsewhere; AREDS age related eye disease study; ARMD age related macular degeneration; POAG primary open angle glaucoma; EBMD epithelial/anterior basement membrane dystrophy; ACIOL anterior chamber intraocular lens; IOL intraocular lens; PCIOL posterior chamber intraocular lens; Phaco/IOL phacoemulsification with intraocular lens placement; PRK photorefractive keratectomy; LASIK laser assisted in situ keratomileusis; HTN hypertension; DM diabetes mellitus; COPD chronic obstructive pulmonary disease

## 2024-03-08 ENCOUNTER — Encounter (INDEPENDENT_AMBULATORY_CARE_PROVIDER_SITE_OTHER): Payer: Self-pay | Admitting: Ophthalmology

## 2024-03-08 ENCOUNTER — Ambulatory Visit (INDEPENDENT_AMBULATORY_CARE_PROVIDER_SITE_OTHER): Admitting: Ophthalmology

## 2024-03-08 DIAGNOSIS — I10 Essential (primary) hypertension: Secondary | ICD-10-CM

## 2024-03-08 DIAGNOSIS — E113513 Type 2 diabetes mellitus with proliferative diabetic retinopathy with macular edema, bilateral: Secondary | ICD-10-CM

## 2024-03-08 DIAGNOSIS — Z794 Long term (current) use of insulin: Secondary | ICD-10-CM | POA: Diagnosis not present

## 2024-03-08 DIAGNOSIS — H35372 Puckering of macula, left eye: Secondary | ICD-10-CM

## 2024-03-08 DIAGNOSIS — H35033 Hypertensive retinopathy, bilateral: Secondary | ICD-10-CM

## 2024-03-08 DIAGNOSIS — H3581 Retinal edema: Secondary | ICD-10-CM

## 2024-03-08 DIAGNOSIS — H35373 Puckering of macula, bilateral: Secondary | ICD-10-CM

## 2024-03-08 DIAGNOSIS — Z961 Presence of intraocular lens: Secondary | ICD-10-CM

## 2024-03-11 ENCOUNTER — Encounter (INDEPENDENT_AMBULATORY_CARE_PROVIDER_SITE_OTHER): Payer: Self-pay | Admitting: Ophthalmology

## 2024-03-11 MED ORDER — BEVACIZUMAB CHEMO INJECTION 1.25MG/0.05ML SYRINGE FOR KALEIDOSCOPE
1.2500 mg | INTRAVITREAL | Status: AC | PRN
Start: 2024-03-11 — End: 2024-03-11
  Administered 2024-03-11: 1.25 mg via INTRAVITREAL

## 2024-03-30 ENCOUNTER — Other Ambulatory Visit (HOSPITAL_COMMUNITY): Payer: Self-pay

## 2024-03-30 MED ORDER — MOUNJARO 5 MG/0.5ML ~~LOC~~ SOAJ
5.0000 mg | SUBCUTANEOUS | 5 refills | Status: AC
  Filled 2024-03-30: qty 2, 28d supply, fill #0
  Filled 2024-04-23: qty 2, 28d supply, fill #1
  Filled 2024-05-24: qty 2, 28d supply, fill #2
  Filled 2024-06-17: qty 2, 28d supply, fill #3

## 2024-03-30 NOTE — Progress Notes (Shared)
 [facility] - Clinic Note  04/06/2024     CHIEF COMPLAINT Patient presents for Retina Follow Up   HISTORY OF PRESENT ILLNESS: Kristy Santos is a 73 y.o. female who presents to the clinic today for:   HPI     Retina Follow Up   Patient presents with  Diabetic Retinopathy.  In both eyes.  This started 4 weeks ago.  Duration of 4 weeks.  Since onset it is stable.        Comments   4 week retina follow up PDR pt is reporting no vision  changes noticed she denies any flashes or floaters pt last reading 101      Last edited by Resa Delon ORN, COT on 04/06/2024  7:56 AM.      Patient states  Referring physician: Larnell Hamilton, MD [address] [place],  [place] [address]  HISTORICAL INFORMATION:   Selected notes from the MEDICAL RECORD NUMBER Referred by Dr. Hamilton L. Holwerda for DM exam LEE:  Ocular Hx-pseudo OU PMH-DM (last A1C 6.4, taking Glyburide), heart disease, HTN    CURRENT MEDICATIONS: No current outpatient medications on file. (Ophthalmic Drugs)   Current Facility-Administered Medications (Ophthalmic Drugs)  Medication Route   aflibercept  (EYLEA ) SOLN 2 mg Intravitreal   aflibercept  (EYLEA ) SOLN 2 mg Intravitreal   Current Outpatient Medications (Other)  Medication Sig   amLODipine  (NORVASC ) 5 MG tablet Take 5 mg by mouth 2 (two) times daily.   aspirin EC 81 MG tablet Take 81 mg by mouth daily.   calcium acetate, Phos Binder, (PHOSLYRA) 667 MG/5ML SOLN Take 800 mg by mouth 3 (three) times daily with meals.    calcium carbonate (OS-CAL) 1250 (500 Ca) MG chewable tablet Chew 1 tablet by mouth daily.   cloNIDine (CATAPRES) 0.1 MG tablet Take 0.1 mg by mouth 2 (two) times daily.   fish oil-omega-3 fatty acids 1000 MG capsule Take 1 g by mouth daily.   furosemide  (LASIX ) 20 MG tablet Take 80 mg by mouth daily.    glyBURIDE (DIABETA) 5 MG tablet Take 10 mg by mouth 2 (two) times daily.   metoprolol  succinate (TOPROL -XL) 100  MG 24 hr tablet Take 50 mg by mouth at bedtime. Take with or immediately following a meal.    Multiple Vitamin (MULTIVITAMIN WITH MINERALS) TABS Take 1 tablet by mouth daily.   tirzepatide  (MOUNJARO ) 5 MG/0.5ML Pen Inject 5 mg into the skin once a week.   tirzepatide  (MOUNJARO ) 5 MG/0.5ML Pen Inject 5 mg into the skin once a week.   Current Facility-Administered Medications (Other)  Medication Route   Bevacizumab  (AVASTIN ) SOLN 1.25 mg Intravitreal   Bevacizumab  (AVASTIN ) SOLN 1.25 mg Intravitreal   Bevacizumab  (AVASTIN ) SOLN 1.25 mg Intravitreal   Bevacizumab  (AVASTIN ) SOLN 1.25 mg Intravitreal   Bevacizumab  (AVASTIN ) SOLN 1.25 mg Intravitreal   REVIEW OF SYSTEMS: ROS   Positive for: Endocrine, Eyes Negative for: Constitutional, Gastrointestinal, Neurological, Skin, Genitourinary, Musculoskeletal, HENT, Cardiovascular, Respiratory, Psychiatric, Allergic/Imm, Heme/Lymph Last edited by Resa Delon ORN, COT on 04/06/2024  7:56 AM.      ALLERGIES Allergies  Allergen Reactions   Bidil [Isosorb Dinitrate-Hydralazine ] Shortness Of Breath    Chest pain, SOB   PAST MEDICAL HISTORY Past Medical History:  Diagnosis Date   Chronic kidney disease    Diabetes mellitus    Hypertension    Past Surgical History:  Procedure Laterality Date   AV FISTULA PLACEMENT Left 09/19/2015   Procedure: Creation of Left  Arm Brachial  Cephalic Arteriovenous Fistula;  Surgeon: Carlin FORBES Haddock, MD;  Location: [facility] OR;  Service: Vascular;  Laterality: Left;   BREAST BIOPSY     BREAST SURGERY  2009   right breast-benign   CATARACT EXTRACTION     COLONOSCOPY W/ BIOPSIES AND POLYPECTOMY     ENDOMETRIAL BIOPSY  12/23/2011   EYE SURGERY     MULTIPLE TOOTH EXTRACTIONS     REFRACTIVE SURGERY  2011   right eye   TOTAL ABDOMINAL HYSTERECTOMY W/ BILATERAL SALPINGOOPHORECTOMY  01/21/2012   w/ LND    FAMILY HISTORY Family History  Problem Relation Age of Onset   Cancer Mother    Diabetes Father      SOCIAL HISTORY Social History   Tobacco Use   Smoking status: Never   Smokeless tobacco: Never   Tobacco comments:    never used  Vaping Use   Vaping status: Never Used  Substance Use Topics   Alcohol use: No   Drug use: No       OPHTHALMIC EXAM:   Base Eye Exam     Visual Acuity (Snellen - Linear)       Right Left   Dist [place] 20/40 20/25   Dist ph [place] 20/30 20/20    Correction: Glasses         Tonometry (Tonopen, 8:02 AM)       Right Left   Pressure 18 18         Pupils       Pupils Dark Light Shape React APD   Right PERRL 4 3 Round Brisk None   Left PERRL 4 3 Round Brisk None         Visual Fields       Left Right    Full Full         Neuro/Psych     Oriented x3: Yes   Mood/Affect: Normal         Dilation     Both eyes: 2.5% Phenylephrine @ 8:02 AM           Slit Lamp and Fundus Exam     Slit Lamp Exam       Right Left   Lids/Lashes Dermatochalasis - upper lid, mild Meibomian gland dysfunction Dermatochalasis - upper lid   Conjunctiva/Sclera Mild Melanosis, Nasal Pinguecula Melanosis, Temporal Pinguecula   Cornea Arcus, Temporal Well healed cataract wounds, Debris in tear film Arcus, Temporal Well healed cataract wounds   Anterior Chamber Deep and quiet Deep and quiet   Iris Round and dilated, No NVI Round and dilated, No NVI   Lens PC IOL in good position, trace PCO PC IOL in good position   Anterior Vitreous post vitrectomy, fibrosis along distal ST arcade, Vitreous syneresis Vitreous syneresis         Fundus Exam       Right Left   Disc Sharp, trace Pallor, Compact fibrosis overlying disc, Temporal Peripapillary atrophy   C/D Ratio 0.2 obscured   Macula Blunted foveal reflex, +cystic changes slightly increased, scattered DBH, scattered focal laser scars, Epiretinal membrane, cluster of IRH temporal to fovea, Retinal pigment epithelial mottling Blunted foveal reflex, Epiretinal membrane, scattered DBH, focal laser  scars, small macroaneurysm along IT arcade, + edema nasal mac   Vessels Vascular attenuation, Copper wiring, AV crossing changes, mildly Tortuous, fibrosis along distal ST arcade -- stable Vascular attenuation, Copper wiring, AV crossing changes, mildly Tortuous, fibrosis overlying distal ST arcade, ?release IT arcades   Periphery Attached, dense 360 PRP  and good fill-in, scattered patches of fibrosis Attached, scattered pattern 360 PRP in place with good fill-in, scattered fibrosis            IMAGING AND PROCEDURES  Imaging and Procedures for @TODAY @  Intravitreal Injection, Pharmacologic Agent - OD - Right Eye       Time Out 04/06/2024. 7:59 AM. Confirmed correct patient, procedure, site, and patient consented.   Anesthesia Topical anesthesia was used. Anesthetic medications included Lidocaine  2%, Proparacaine 0.5%.   Procedure Preparation included 5% betadine to ocular surface, eyelid speculum. A supplied needle was used.   Injection: 1.25 mg Bevacizumab  1.25mg /0.91ml   Route: Intravitreal, Site: Right Eye   NDC: H525437, Lot: [id number], Expiration date: 07/01/2024   Post-op Post injection exam found visual acuity of at least counting fingers. The patient tolerated the procedure well. There were no complications. The patient received written and verbal post procedure care education.      Intravitreal Injection, Pharmacologic Agent - OS - Left Eye       Time Out 04/06/2024. 7:59 AM. Confirmed correct patient, procedure, site, and patient consented.   Anesthesia Topical anesthesia was used. Anesthetic medications included Lidocaine  2%, Proparacaine 0.5%.   Procedure Preparation included 5% betadine to ocular surface, eyelid speculum. A (32g) needle was used.   Injection: 1.25 mg Bevacizumab  1.25mg /0.21ml   Route: Intravitreal, Site: Left Eye   NDC: H525437, Lot: [id number]$ , Expiration date: 04/23/2024   Post-op Post injection exam found visual acuity of at  least counting fingers. The patient tolerated the procedure well. There were no complications. The patient received written and verbal post procedure care education.      Fluorescein  Angiography Optos (Transit OD)       Right Eye Progression has no prior data. Early phase findings include staining, microaneurysm. Mid/Late phase findings include leakage, staining, microaneurysm.   Left Eye Progression has no prior data. Early phase findings include staining, microaneurysm. Mid/Late phase findings include leakage, staining, microaneurysm.   Notes Images captured and stored on drive;   Impression: Low signal OU OD: late focal leakage SN periphery OS: late focal leakage along IT arcade              ASSESSMENT/PLAN:    ICD-10-CM   1. Proliferative diabetic retinopathy of both eyes with macular edema associated with type 2 diabetes mellitus (HCC)  E11.3513 OCT, Retina - OU - Both Eyes    Intravitreal Injection, Pharmacologic Agent - OD - Right Eye    Intravitreal Injection, Pharmacologic Agent - OS - Left Eye    Fluorescein  Angiography Optos (Transit OD)    2. Encounter for long-term (current) use of insulin  (HCC)  Z79.4     3. Essential hypertension  I10     4. Hypertensive retinopathy of both eyes  H35.033     5. Pseudophakia  Z96.1     6. Epiretinal membrane, left eye  H35.372     7. 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 Proliferative diabetic retinopathy w/ mild DME, OU - former pt of Dr. Jarold and Dr. Lilian  Records obtained from [facility]  Dr. Lilian -- [facility] 2008-[facility]  Dr. Jarold -- PPV, laser for [facility],  8.19.10    Focal laser 2.25.11 - exam shows preretinal fibrosis OU (OS > OD) - FA (8.13.19) with low signal, but shows late focal peripheral leakage OU  - FA (11.11.25) OD: Mild late focal leakage SN periphery--stable from 2019; OS: Mild late focal leakage along  IT arcades--stable from 2019; no active NV OU - OCT NI:Ezmdpduzwu  IRF/edema -- greatest IN macula--slightly improved centrally, OS: +IRF/edema nasal fovea and macula--improved, vitreous traction at disc and distal arcades -- stable from prior - S/P PRP fill in OS (08.13.19)  - S/P PRP fill in OD (08.27.19) - S/P IVA OD (09.10.19), #2 (10.08.19), #3 (11.05.19), #4 (12.03.19), #5 (01.09.20), #6 (10.13.25) - s/p IVA OS #1 (10.13.25) ============== - s/p IVE OD #1 (02.06.20), #2 (03.05.20) - BCVA 20/40 from 20/30 OD, 20/30 from 20/20 OS - recommend IVA OU (OD #7 & IVA OS #2) today 11.11.25 with f/u in 4 weeks - pt wishes to proceed with IVA - RBA of procedure discussed, questions answered - IVA informed consent obtained and signed (OU) 10.13.25 - see procedure note - f/u 4 weeks for DFE/OCT/possible injection  3. 4. Hypertensive retinopathy OU - discussed importance of tight BP control - monitor  5. Pseudophakia OU  - s/p CE/IOL OU -- pt unable to recall when and with who  - doing well  - monitor  6. Epiretinal membrane, OS - The natural history, anatomy, potential for loss of vision, and treatment options including vitrectomy techniques and the complications of endophthalmitis, retinal detachment, vitreous hemorrhage, cataract progression and permanent vision loss discussed with the patient. - mild ERM over macula - monitor  7. Preretinal fibrosis OU - OD: mild patches in periphery - OS: significant focal areas over disc and along distal temporal arcades -- causing traction and IRF - stable OU - monitor for now -- may require surgical intervention / peeling eventually, but BCVA 20/40 OD and 20/30 OS  Ophthalmic Meds Ordered this visit:  No orders of the defined types were placed in this encounter.    No follow-ups on file.  There are no Patient Instructions on file for this visit.    This document serves as a record of services personally performed by Redell JUDITHANN Hans, MD, PhD. It was created on their behalf by Wanda GEANNIE Keens, COT an  ophthalmic technician. The creation of this record is the provider's dictation and/or activities during the visit.    Electronically signed by:  Wanda GEANNIE Keens, COT  04/06/24 9:23 AM  This document serves as a record of services personally performed by Redell JUDITHANN Hans, MD, PhD. It was created on their behalf by Almetta Pesa, an ophthalmic technician. The creation of this record is the provider's dictation and/or activities during the visit.    Electronically signed by: Almetta Pesa, OA, 04/06/24  9:23 AM   Redell JUDITHANN Hans, M.D., Ph.D. Diseases & Surgery of the Retina and Vitreous [facility]   Abbreviations: M myopia (nearsighted); A astigmatism; H hyperopia (farsighted); P presbyopia; Mrx spectacle prescription;  CTL contact lenses; OD right eye; OS left eye; OU both eyes  XT exotropia; ET esotropia; PEK punctate epithelial keratitis; PEE punctate epithelial erosions; DES dry eye syndrome; MGD meibomian gland dysfunction; ATs artificial tears; PFAT's preservative free artificial tears; NSC nuclear sclerotic cataract; PSC posterior subcapsular cataract; ERM epi-retinal membrane; PVD posterior vitreous detachment; RD retinal detachment; DM diabetes mellitus; DR diabetic retinopathy; NPDR non-proliferative diabetic retinopathy; PDR proliferative diabetic retinopathy; CSME clinically significant macular edema; DME diabetic macular edema; dbh dot blot hemorrhages; CWS cotton wool spot; POAG primary open angle glaucoma; C/D cup-to-disc ratio; HVF humphrey visual field; GVF goldmann visual field; OCT optical coherence tomography; IOP intraocular pressure; BRVO Branch retinal vein occlusion; CRVO central retinal vein occlusion; CRAO central retinal artery occlusion;  BRAO branch retinal artery occlusion; RT retinal tear; SB scleral buckle; PPV pars plana vitrectomy; VH Vitreous hemorrhage; PRP panretinal laser photocoagulation; IVK intravitreal kenalog; VMT vitreomacular  traction; MH Macular hole;  NVD neovascularization of the disc; NVE neovascularization elsewhere; AREDS age related eye disease study; ARMD age related macular degeneration; POAG primary open angle glaucoma; EBMD epithelial/anterior basement membrane dystrophy; ACIOL anterior chamber intraocular lens; IOL intraocular lens; PCIOL posterior chamber intraocular lens; Phaco/IOL phacoemulsification with intraocular lens placement; PRK photorefractive keratectomy; LASIK laser assisted in situ keratomileusis; HTN hypertension; DM diabetes mellitus; COPD chronic obstructive pulmonary disease

## 2024-04-06 ENCOUNTER — Encounter (INDEPENDENT_AMBULATORY_CARE_PROVIDER_SITE_OTHER): Payer: Self-pay | Admitting: Ophthalmology

## 2024-04-06 ENCOUNTER — Ambulatory Visit (INDEPENDENT_AMBULATORY_CARE_PROVIDER_SITE_OTHER): Admitting: Ophthalmology

## 2024-04-06 VITALS — BP 127/80 | HR 109

## 2024-04-06 DIAGNOSIS — H35372 Puckering of macula, left eye: Secondary | ICD-10-CM

## 2024-04-06 DIAGNOSIS — E113513 Type 2 diabetes mellitus with proliferative diabetic retinopathy with macular edema, bilateral: Secondary | ICD-10-CM

## 2024-04-06 DIAGNOSIS — Z961 Presence of intraocular lens: Secondary | ICD-10-CM

## 2024-04-06 DIAGNOSIS — Z794 Long term (current) use of insulin: Secondary | ICD-10-CM

## 2024-04-06 DIAGNOSIS — H35033 Hypertensive retinopathy, bilateral: Secondary | ICD-10-CM | POA: Diagnosis not present

## 2024-04-06 DIAGNOSIS — H35373 Puckering of macula, bilateral: Secondary | ICD-10-CM

## 2024-04-06 DIAGNOSIS — I10 Essential (primary) hypertension: Secondary | ICD-10-CM

## 2024-04-07 ENCOUNTER — Encounter (INDEPENDENT_AMBULATORY_CARE_PROVIDER_SITE_OTHER): Payer: Self-pay | Admitting: Ophthalmology

## 2024-04-07 MED ORDER — BEVACIZUMAB CHEMO INJECTION 1.25MG/0.05ML SYRINGE FOR KALEIDOSCOPE
1.2500 mg | INTRAVITREAL | Status: AC | PRN
Start: 2024-04-07 — End: 2024-04-07
  Administered 2024-04-07: 1.25 mg via INTRAVITREAL

## 2024-04-20 NOTE — Progress Notes (Shared)
 [facility] - Clinic Note  05/04/2024     CHIEF COMPLAINT Patient presents for No chief complaint on file.   HISTORY OF PRESENT ILLNESS: Kristy Santos is a 73 y.o. female who presents to the clinic today for:      Patient states  Referring physician: Larnell Hamilton, MD [address] [place],  [place] [address]  HISTORICAL INFORMATION:   Selected notes from the MEDICAL RECORD NUMBER Referred by Dr. Hamilton L. Holwerda for DM exam LEE:  Ocular Hx-pseudo OU PMH-DM (last A1C 6.4, taking Glyburide), heart disease, HTN    CURRENT MEDICATIONS: No current outpatient medications on file. (Ophthalmic Drugs)   Current Facility-Administered Medications (Ophthalmic Drugs)  Medication Route   aflibercept  (EYLEA ) SOLN 2 mg Intravitreal   aflibercept  (EYLEA ) SOLN 2 mg Intravitreal   Current Outpatient Medications (Other)  Medication Sig   amLODipine  (NORVASC ) 5 MG tablet Take 5 mg by mouth 2 (two) times daily.   aspirin EC 81 MG tablet Take 81 mg by mouth daily.   calcium acetate, Phos Binder, (PHOSLYRA) 667 MG/5ML SOLN Take 800 mg by mouth 3 (three) times daily with meals.    calcium carbonate (OS-CAL) 1250 (500 Ca) MG chewable tablet Chew 1 tablet by mouth daily.   cloNIDine (CATAPRES) 0.1 MG tablet Take 0.1 mg by mouth 2 (two) times daily.   fish oil-omega-3 fatty acids 1000 MG capsule Take 1 g by mouth daily.   furosemide  (LASIX ) 20 MG tablet Take 80 mg by mouth daily.    glyBURIDE (DIABETA) 5 MG tablet Take 10 mg by mouth 2 (two) times daily.   metoprolol  succinate (TOPROL -XL) 100 MG 24 hr tablet Take 50 mg by mouth at bedtime. Take with or immediately following a meal.    Multiple Vitamin (MULTIVITAMIN WITH MINERALS) TABS Take 1 tablet by mouth daily.   tirzepatide  (MOUNJARO ) 5 MG/0.5ML Pen Inject 5 mg into the skin once a week.   tirzepatide  (MOUNJARO ) 5 MG/0.5ML Pen Inject 5 mg into the skin once a week.   Current Facility-Administered Medications  (Other)  Medication Route   Bevacizumab  (AVASTIN ) SOLN 1.25 mg Intravitreal   Bevacizumab  (AVASTIN ) SOLN 1.25 mg Intravitreal   Bevacizumab  (AVASTIN ) SOLN 1.25 mg Intravitreal   Bevacizumab  (AVASTIN ) SOLN 1.25 mg Intravitreal   Bevacizumab  (AVASTIN ) SOLN 1.25 mg Intravitreal   REVIEW OF SYSTEMS:    ALLERGIES Allergies  Allergen Reactions   Bidil [Isosorb Dinitrate-Hydralazine ] Shortness Of Breath    Chest pain, SOB   PAST MEDICAL HISTORY Past Medical History:  Diagnosis Date   Chronic kidney disease    Diabetes mellitus    Hypertension    Past Surgical History:  Procedure Laterality Date   AV FISTULA PLACEMENT Left 09/19/2015   Procedure: Creation of Left  Arm Brachial Cephalic Arteriovenous Fistula;  Surgeon: Carlin FORBES Haddock, MD;  Location: [facility] OR;  Service: Vascular;  Laterality: Left;   BREAST BIOPSY     BREAST SURGERY  2009   right breast-benign   CATARACT EXTRACTION     COLONOSCOPY W/ BIOPSIES AND POLYPECTOMY     ENDOMETRIAL BIOPSY  12/23/2011   EYE SURGERY     MULTIPLE TOOTH EXTRACTIONS     REFRACTIVE SURGERY  2011   right eye   TOTAL ABDOMINAL HYSTERECTOMY W/ BILATERAL SALPINGOOPHORECTOMY  01/21/2012   w/ LND    FAMILY HISTORY Family History  Problem Relation Age of Onset   Cancer Mother    Diabetes Father     SOCIAL HISTORY Social History  Tobacco Use   Smoking status: Never   Smokeless tobacco: Never   Tobacco comments:    never used  Vaping Use   Vaping status: Never Used  Substance Use Topics   Alcohol use: No   Drug use: No       OPHTHALMIC EXAM:   Not recorded     IMAGING AND PROCEDURES  Imaging and Procedures for @TODAY @           ASSESSMENT/PLAN:  No diagnosis found.  1,2. Proliferative diabetic retinopathy w/ mild DME, OU - former pt of Dr. Jarold and Dr. Lilian  Records obtained from [facility]  Dr. Lilian -- [facility] 2008-2009  Dr. Jarold -- PPV, laser for [facility],  8.19.10    Focal laser 2.25.11 - exam shows  preretinal fibrosis OU (OS > OD) - FA (8.13.19) with low signal, but shows late focal peripheral leakage OU  - repeat FA (11.11.25) OD: Mild late focal leakage SN periphery--stable from 2019; OS: Mild late focal leakage along IT arcades--stable from 2019; no active NV OU - OCT NI:Ezmdpduzwu IRF/edema -- greatest IN macula--slightly improved centrally, OS: +IRF/edema nasal fovea and macula--improved, vitreous traction at disc and distal arcades -- stable from prior - S/P PRP fill in OS (08.13.19)  - S/P PRP fill in OD (08.27.19) - S/P IVA OD (09.10.19), #2 (10.08.19), #3 (11.05.19), #4 (12.03.19), #5 (01.09.20), #6 (10.13.25), #7 (11.11.25) - s/p IVA OS #1 (10.13.25), #2 (11.11.25) ============== - s/p IVE OD #1 (02.06.20), #2 (03.05.20) - BCVA OD 20/30 from 20/40; OS 20/20 from 20/30 - recommend IVA OU (OD #8 & IVA OS #3) today 12.09.25 with f/u in 4 weeks - pt wishes to proceed with IVA - RBA of procedure discussed, questions answered - IVA informed consent obtained and signed (OU) 10.13.25 - see procedure note - f/u 4 weeks for DFE/OCT/possible injection  3. 4. Hypertensive retinopathy OU - discussed importance of tight BP control - monitor  5. Pseudophakia OU  - s/p CE/IOL OU -- pt unable to recall when and with who  - doing well  - monitor  6. Epiretinal membrane, OS - The natural history, anatomy, potential for loss of vision, and treatment options including vitrectomy techniques and the complications of endophthalmitis, retinal detachment, vitreous hemorrhage, cataract progression and permanent vision loss discussed with the patient. - mild ERM over macula - monitor  7. Preretinal fibrosis OU - OD: mild patches in periphery - OS: significant focal areas over disc and along distal temporal arcades -- causing traction and IRF - stable OU - monitor for now -- may require surgical intervention / peeling eventually, but BCVA 20/30 OD and 20/20 OS  Ophthalmic Meds Ordered this  visit:  No orders of the defined types were placed in this encounter.    No follow-ups on file.  There are no Patient Instructions on file for this visit.  This document serves as a record of services personally performed by Redell JUDITHANN Hans, MD, PhD. It was created on their behalf by Wanda GEANNIE Keens, COT an ophthalmic technician. The creation of this record is the provider's dictation and/or activities during the visit.    Electronically signed by:  Wanda GEANNIE Keens, COT  04/20/24 7:28 AM    Redell JUDITHANN Hans, M.D., Ph.D. Diseases & Surgery of the Retina and Vitreous [facility]     Abbreviations: M myopia (nearsighted); A astigmatism; H hyperopia (farsighted); P presbyopia; Mrx spectacle prescription;  CTL contact lenses; OD right eye; OS  left eye; OU both eyes  XT exotropia; ET esotropia; PEK punctate epithelial keratitis; PEE punctate epithelial erosions; DES dry eye syndrome; MGD meibomian gland dysfunction; ATs artificial tears; PFAT's preservative free artificial tears; NSC nuclear sclerotic cataract; PSC posterior subcapsular cataract; ERM epi-retinal membrane; PVD posterior vitreous detachment; RD retinal detachment; DM diabetes mellitus; DR diabetic retinopathy; NPDR non-proliferative diabetic retinopathy; PDR proliferative diabetic retinopathy; CSME clinically significant macular edema; DME diabetic macular edema; dbh dot blot hemorrhages; CWS cotton wool spot; POAG primary open angle glaucoma; C/D cup-to-disc ratio; HVF humphrey visual field; GVF goldmann visual field; OCT optical coherence tomography; IOP intraocular pressure; BRVO Branch retinal vein occlusion; CRVO central retinal vein occlusion; CRAO central retinal artery occlusion; BRAO branch retinal artery occlusion; RT retinal tear; SB scleral buckle; PPV pars plana vitrectomy; VH Vitreous hemorrhage; PRP panretinal laser photocoagulation; IVK intravitreal kenalog; VMT vitreomacular traction; MH  Macular hole;  NVD neovascularization of the disc; NVE neovascularization elsewhere; AREDS age related eye disease study; ARMD age related macular degeneration; POAG primary open angle glaucoma; EBMD epithelial/anterior basement membrane dystrophy; ACIOL anterior chamber intraocular lens; IOL intraocular lens; PCIOL posterior chamber intraocular lens; Phaco/IOL phacoemulsification with intraocular lens placement; PRK photorefractive keratectomy; LASIK laser assisted in situ keratomileusis; HTN hypertension; DM diabetes mellitus; COPD chronic obstructive pulmonary disease

## 2024-04-23 ENCOUNTER — Other Ambulatory Visit (HOSPITAL_COMMUNITY): Payer: Self-pay

## 2024-05-04 ENCOUNTER — Encounter (INDEPENDENT_AMBULATORY_CARE_PROVIDER_SITE_OTHER): Admitting: Ophthalmology

## 2024-05-04 DIAGNOSIS — Z961 Presence of intraocular lens: Secondary | ICD-10-CM

## 2024-05-04 DIAGNOSIS — H35372 Puckering of macula, left eye: Secondary | ICD-10-CM

## 2024-05-04 DIAGNOSIS — Z794 Long term (current) use of insulin: Secondary | ICD-10-CM

## 2024-05-04 DIAGNOSIS — H35033 Hypertensive retinopathy, bilateral: Secondary | ICD-10-CM

## 2024-05-04 DIAGNOSIS — E113513 Type 2 diabetes mellitus with proliferative diabetic retinopathy with macular edema, bilateral: Secondary | ICD-10-CM

## 2024-05-04 DIAGNOSIS — H35373 Puckering of macula, bilateral: Secondary | ICD-10-CM

## 2024-05-04 DIAGNOSIS — I10 Essential (primary) hypertension: Secondary | ICD-10-CM

## 2024-05-06 NOTE — Progress Notes (Shared)
 [facility] - Clinic Note  05/10/2024     CHIEF COMPLAINT Patient presents for Retina Follow Up   HISTORY OF PRESENT ILLNESS: Kristy Santos is a 73 y.o. female who presents to the clinic today for:   HPI     Retina Follow Up   Patient presents with  Diabetic Retinopathy.  In both eyes.  This started 4 weeks ago.        Comments   Patient here for 4 weeks retina follow up for PDR OU. Patient states vision doing real good. It is improving. No eye pain.       Last edited by Orval Asberry RAMAN, COA on 05/10/2024  8:55 AM.       Patient states she does feel [facility] is improving  Referring physician: Larnell Hamilton, MD [address] [place],  [place] [address]  HISTORICAL INFORMATION:   Selected notes from the MEDICAL RECORD NUMBER Referred by Dr. Hamilton L. Holwerda for DM exam LEE:  Ocular Hx-pseudo OU PMH-DM (last A1C 6.4, taking Glyburide), heart disease, HTN    CURRENT MEDICATIONS: No current outpatient medications on file. (Ophthalmic Drugs)   Current Facility-Administered Medications (Ophthalmic Drugs)  Medication Route   aflibercept  (EYLEA ) SOLN 2 mg Intravitreal   aflibercept  (EYLEA ) SOLN 2 mg Intravitreal   Current Outpatient Medications (Other)  Medication Sig   amLODipine  (NORVASC ) 5 MG tablet Take 5 mg by mouth 2 (two) times daily.   aspirin EC 81 MG tablet Take 81 mg by mouth daily.   calcium acetate, Phos Binder, (PHOSLYRA) 667 MG/5ML SOLN Take 800 mg by mouth 3 (three) times daily with meals.    calcium carbonate (OS-CAL) 1250 (500 Ca) MG chewable tablet Chew 1 tablet by mouth daily.   cloNIDine (CATAPRES) 0.1 MG tablet Take 0.1 mg by mouth 2 (two) times daily.   fish oil-omega-3 fatty acids 1000 MG capsule Take 1 g by mouth daily.   furosemide  (LASIX ) 20 MG tablet Take 80 mg by mouth daily.    glyBURIDE (DIABETA) 5 MG tablet Take 10 mg by mouth 2 (two) times daily.   metoprolol  succinate (TOPROL -XL) 100 MG 24 hr tablet Take 50  mg by mouth at bedtime. Take with or immediately following a meal.    Multiple Vitamin (MULTIVITAMIN WITH MINERALS) TABS Take 1 tablet by mouth daily.   tirzepatide  (MOUNJARO ) 5 MG/0.5ML Pen Inject 5 mg into the skin once a week.   tirzepatide  (MOUNJARO ) 5 MG/0.5ML Pen Inject 5 mg into the skin once a week.   Current Facility-Administered Medications (Other)  Medication Route   Bevacizumab  (AVASTIN ) SOLN 1.25 mg Intravitreal   Bevacizumab  (AVASTIN ) SOLN 1.25 mg Intravitreal   Bevacizumab  (AVASTIN ) SOLN 1.25 mg Intravitreal   Bevacizumab  (AVASTIN ) SOLN 1.25 mg Intravitreal   Bevacizumab  (AVASTIN ) SOLN 1.25 mg Intravitreal   REVIEW OF SYSTEMS: ROS   Positive for: Endocrine, Eyes Negative for: Constitutional, Gastrointestinal, Neurological, Skin, Genitourinary, Musculoskeletal, HENT, Cardiovascular, Respiratory, Psychiatric, Allergic/Imm, Heme/Lymph Last edited by Orval Asberry RAMAN, COA on 05/10/2024  8:55 AM.       ALLERGIES Allergies  Allergen Reactions   Bidil [Isosorb Dinitrate-Hydralazine ] Shortness Of Breath    Chest pain, SOB   PAST MEDICAL HISTORY Past Medical History:  Diagnosis Date   Chronic kidney disease    Diabetes mellitus    Hypertension    Past Surgical History:  Procedure Laterality Date   AV FISTULA PLACEMENT Left 09/19/2015   Procedure: Creation of Left  Arm Brachial Cephalic Arteriovenous Fistula;  Surgeon: Carlin FORBES Haddock, MD;  Location: [facility] OR;  Service: Vascular;  Laterality: Left;   BREAST BIOPSY     BREAST SURGERY  2009   right breast-benign   CATARACT EXTRACTION     COLONOSCOPY W/ BIOPSIES AND POLYPECTOMY     ENDOMETRIAL BIOPSY  12/23/2011   EYE SURGERY     MULTIPLE TOOTH EXTRACTIONS     REFRACTIVE SURGERY  2011   right eye   TOTAL ABDOMINAL HYSTERECTOMY W/ BILATERAL SALPINGOOPHORECTOMY  01/21/2012   w/ LND    FAMILY HISTORY Family History  Problem Relation Age of Onset   Cancer Mother    Diabetes Father     SOCIAL HISTORY Social  History   Tobacco Use   Smoking status: Never   Smokeless tobacco: Never   Tobacco comments:    never used  Vaping Use   Vaping status: Never Used  Substance Use Topics   Alcohol use: No   Drug use: No       OPHTHALMIC EXAM:   Base Eye Exam     Visual Acuity (Snellen - Linear)       Right Left   Dist [place] 20/30 -2 20/20 -2   Dist ph [place] NI          Tonometry (Tonopen, 8:53 AM)       Right Left   Pressure 19 17         Pupils       Dark Light Shape React APD   Right 4 3 Round Brisk None   Left 4 3 Round Brisk None         Visual Fields (Counting fingers)       Left Right    Full Full         Extraocular Movement       Right Left    Full, Ortho Full, Ortho         Neuro/Psych     Oriented x3: Yes   Mood/Affect: Normal         Dilation     Both eyes: 1.0% Mydriacyl, 2.5% Phenylephrine @ 8:53 AM           Slit Lamp and Fundus Exam     Slit Lamp Exam       Right Left   Lids/Lashes Dermatochalasis - upper lid, mild Meibomian gland dysfunction Dermatochalasis - upper lid   Conjunctiva/Sclera Mild Melanosis, Nasal Pinguecula Melanosis, Temporal Pinguecula   Cornea Arcus, Temporal Well healed cataract wounds, Debris in tear film Arcus, Temporal Well healed cataract wounds   Anterior Chamber Deep and quiet Deep and quiet   Iris Round and dilated, No NVI Round and dilated, No NVI   Lens PC IOL in good position, trace PCO PC IOL in good position   Anterior Vitreous post vitrectomy, fibrosis along distal ST arcade, Vitreous syneresis Vitreous syneresis         Fundus Exam       Right Left   Disc Sharp, trace Pallor, Compact fibrosis overlying disc, Temporal Peripapillary atrophy   C/D Ratio 0.2 0.3   Macula Blunted foveal reflex, +cystic changes slightly improved, scattered DBH, scattered focal laser scars, Epiretinal membrane, cluster of IRH temporal to fovea, Retinal pigment epithelial mottling Blunted foveal reflex, Epiretinal  membrane, scattered DBH, focal laser scars, small macroaneurysm along IT arcade, + edema nasal mac--improved   Vessels Vascular attenuation, Copper wiring, AV crossing changes, mildly Tortuous, fibrosis along distal ST arcade -- stable Vascular attenuation, Copper wiring, AV  crossing changes, mildly Tortuous, fibrosis overlying distal ST arcade, ?release IT arcades   Periphery Attached, dense 360 PRP and good fill-in, scattered patches of fibrosis Attached, scattered pattern 360 PRP in place with good fill-in, scattered fibrosis            IMAGING AND PROCEDURES  Imaging and Procedures for @TODAY @  OCT, Retina - OU - Both Eyes       Right Eye Quality was good. Central Foveal Thickness: 237. Progression has improved. Findings include no SRF, abnormal foveal contour, retinal drusen , intraretinal fluid, outer retinal atrophy (Persistent IRF/edema -- greatest IN macula--slightly improved centrally).   Left Eye Quality was good. Central Foveal Thickness: 203. Progression has improved. Findings include no SRF, abnormal foveal contour, outer retinal atrophy (+IRF/edema nasal fovea and macula--improved, vitreous traction at disc and distal arcades).   Notes *Images captured and stored on drive  Diagnosis / Impression:  OD: Persistent IRF/edema -- greatest IN macula--slightly improved centrally OS: +IRF/edema nasal fovea and macula--improved, vitreous traction at disc and distal arcades -- stable from prior  Clinical management:  See below  Abbreviations: NFP - Normal foveal profile. CME - cystoid macular edema. PED - pigment epithelial detachment. IRF - intraretinal fluid. SRF - subretinal fluid. EZ - ellipsoid zone. ERM - epiretinal membrane. ORA - outer retinal atrophy. ORT - outer retinal tubulation. SRHM - subretinal hyper-reflective material               ASSESSMENT/PLAN:    ICD-10-CM   1. Proliferative diabetic retinopathy of both eyes with macular edema associated with  type 2 diabetes mellitus (HCC)  E11.3513 OCT, Retina - OU - Both Eyes    2. Encounter for long-term (current) use of insulin  (HCC)  Z79.4     3. Essential hypertension  I10     4. Hypertensive retinopathy of both eyes  H35.033     5. Pseudophakia  Z96.1     6. Epiretinal membrane, left eye  H35.372     7. 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 Proliferative diabetic retinopathy w/ mild DME, OU - former pt of Dr. Jarold and Dr. Lilian  Records obtained from [facility]  Dr. Lilian -- [facility] 2008-[facility]  Dr. Jarold -- PPV, laser for [facility],  8.19.10    Focal laser 2.25.11 - exam shows preretinal fibrosis OU (OS > OD) - FA (8.13.19) with low signal, but shows late focal peripheral leakage OU  - repeat FA (11.11.25) OD: Mild late focal leakage SN periphery--stable from 2019; OS: Mild late focal leakage along IT arcades--stable from 2019; no active NV OU - OCT NI:Ezmdpduzwu IRF/edema -- greatest IN macula--slightly improved centrally, OS: +IRF/edema nasal fovea and macula--improved, vitreous traction at disc and distal arcades -- stable from prior at 4.9 weeks - S/P PRP fill in OS (08.13.19)  - S/P PRP fill in OD (08.27.19) - s/p IVA OD (09.10.19), #2 (10.08.19), #3 (11.05.19), #4 (12.03.19), #5 (01.09.20), #6 (10.13.25), #7 (11.11.25) - s/p IVA OS #1 (10.13.25), #2 (11.11.25) ============== - s/p IVE OD #1 (02.06.20), #2 (03.05.20) - BCVA OD 20/30 stable; OS 20/20 stable - recommend IVA OU (OD #8 & IVA OS #3) today, 12.15.25 with f/u in 4-5 weeks - pt wishes to proceed with IVA - RBA of procedure discussed, questions answered - IVA informed consent obtained and signed (OU) 10.13.25 - see procedure note - Pt would owe 20% for Eylea , option discussed.  - f/u 4-5 weeks for DFE/OCT/possible injection  3.  4. Hypertensive retinopathy OU - discussed importance of tight BP control - monitor  5. Pseudophakia OU  - s/p CE/IOL OU -- pt unable to recall when and with who  -  doing well  - monitor  6. Epiretinal membrane, OS - The natural history, anatomy, potential for loss of vision, and treatment options including vitrectomy techniques and the complications of endophthalmitis, retinal detachment, vitreous hemorrhage, cataract progression and permanent vision loss discussed with the patient. - mild ERM over macula - monitor  7. Preretinal fibrosis OU - OD: mild patches in periphery - OS: significant focal areas over disc and along distal temporal arcades -- causing traction and IRF - stable OU - monitor for now -- may require surgical intervention / peeling eventually, but BCVA 20/30 OD and 20/20 OS  Ophthalmic Meds Ordered this visit:  No orders of the defined types were placed in this encounter.    No follow-ups on file.  There are no Patient Instructions on file for this visit.  This document serves as a record of services personally performed by Redell JUDITHANN Hans, MD, PhD. It was created on their behalf by Avelina Pereyra, COA an ophthalmic technician. The creation of this record is the provider's dictation and/or activities during the visit.   Electronically signed by: Avelina GORMAN Pereyra, COT  05/10/2024  9:49 AM   This document serves as a record of services personally performed by Redell JUDITHANN Hans, MD, PhD. It was created on their behalf by Almetta Pesa, an ophthalmic technician. The creation of this record is the provider's dictation and/or activities during the visit.    Electronically signed by: Almetta Pesa, OA, 05/10/2024  9:49 AM   Redell JUDITHANN Hans, M.D., Ph.D. Diseases & Surgery of the Retina and Vitreous [facility] 05/10/2024  Abbreviations: M myopia (nearsighted); A astigmatism; H hyperopia (farsighted); P presbyopia; Mrx spectacle prescription;  CTL contact lenses; OD right eye; OS left eye; OU both eyes  XT exotropia; ET esotropia; PEK punctate epithelial keratitis; PEE punctate epithelial erosions; DES dry  eye syndrome; MGD meibomian gland dysfunction; ATs artificial tears; PFAT's preservative free artificial tears; NSC nuclear sclerotic cataract; PSC posterior subcapsular cataract; ERM epi-retinal membrane; PVD posterior vitreous detachment; RD retinal detachment; DM diabetes mellitus; DR diabetic retinopathy; NPDR non-proliferative diabetic retinopathy; PDR proliferative diabetic retinopathy; CSME clinically significant macular edema; DME diabetic macular edema; dbh dot blot hemorrhages; CWS cotton wool spot; POAG primary open angle glaucoma; C/D cup-to-disc ratio; HVF humphrey visual field; GVF goldmann visual field; OCT optical coherence tomography; IOP intraocular pressure; BRVO Branch retinal vein occlusion; CRVO central retinal vein occlusion; CRAO central retinal artery occlusion; BRAO branch retinal artery occlusion; RT retinal tear; SB scleral buckle; PPV pars plana vitrectomy; VH Vitreous hemorrhage; PRP panretinal laser photocoagulation; IVK intravitreal kenalog; VMT vitreomacular traction; MH Macular hole;  NVD neovascularization of the disc; NVE neovascularization elsewhere; AREDS age related eye disease study; ARMD age related macular degeneration; POAG primary open angle glaucoma; EBMD epithelial/anterior basement membrane dystrophy; ACIOL anterior chamber intraocular lens; IOL intraocular lens; PCIOL posterior chamber intraocular lens; Phaco/IOL phacoemulsification with intraocular lens placement; PRK photorefractive keratectomy; LASIK laser assisted in situ keratomileusis; HTN hypertension; DM diabetes mellitus; COPD chronic obstructive pulmonary disease

## 2024-05-10 ENCOUNTER — Other Ambulatory Visit: Payer: Self-pay | Admitting: Nephrology

## 2024-05-10 ENCOUNTER — Encounter (INDEPENDENT_AMBULATORY_CARE_PROVIDER_SITE_OTHER): Payer: Self-pay | Admitting: Ophthalmology

## 2024-05-10 ENCOUNTER — Ambulatory Visit (INDEPENDENT_AMBULATORY_CARE_PROVIDER_SITE_OTHER): Admitting: Ophthalmology

## 2024-05-10 DIAGNOSIS — Z961 Presence of intraocular lens: Secondary | ICD-10-CM

## 2024-05-10 DIAGNOSIS — H35373 Puckering of macula, bilateral: Secondary | ICD-10-CM

## 2024-05-10 DIAGNOSIS — E113513 Type 2 diabetes mellitus with proliferative diabetic retinopathy with macular edema, bilateral: Secondary | ICD-10-CM | POA: Diagnosis not present

## 2024-05-10 DIAGNOSIS — Z794 Long term (current) use of insulin: Secondary | ICD-10-CM | POA: Diagnosis not present

## 2024-05-10 DIAGNOSIS — I10 Essential (primary) hypertension: Secondary | ICD-10-CM

## 2024-05-10 DIAGNOSIS — H35033 Hypertensive retinopathy, bilateral: Secondary | ICD-10-CM | POA: Diagnosis not present

## 2024-05-10 DIAGNOSIS — H35372 Puckering of macula, left eye: Secondary | ICD-10-CM

## 2024-05-10 DIAGNOSIS — Z940 Kidney transplant status: Secondary | ICD-10-CM

## 2024-05-10 MED ORDER — BEVACIZUMAB CHEMO INJECTION 1.25MG/0.05ML SYRINGE FOR KALEIDOSCOPE
1.2500 mg | INTRAVITREAL | Status: AC | PRN
  Administered 2024-05-10: 14:00:00 1.25 mg via INTRAVITREAL

## 2024-05-24 ENCOUNTER — Ambulatory Visit
Admission: RE | Admit: 2024-05-24 | Discharge: 2024-05-24 | Disposition: A | Discharge status: Home / Self Care | Source: Ambulatory Visit | Attending: Nephrology | Admitting: Nephrology

## 2024-05-24 ENCOUNTER — Other Ambulatory Visit (HOSPITAL_COMMUNITY): Payer: Self-pay

## 2024-05-24 DIAGNOSIS — Z940 Kidney transplant status: Secondary | ICD-10-CM

## 2024-06-04 NOTE — Progress Notes (Shared)
 " [facility] - Clinic Note  06/14/2024     CHIEF COMPLAINT Patient presents for No chief complaint on file.   HISTORY OF PRESENT ILLNESS: Kristy Santos is a 74 y.o. female who presents to the clinic today for:    Patient states she does feel [facility] is improving  Referring physician: Larnell Hamilton, MD [address] [place],  [place] [address]  HISTORICAL INFORMATION:   Selected notes from the MEDICAL RECORD NUMBER Referred by Dr. Hamilton L. Holwerda for DM exam LEE:  Ocular Hx-pseudo OU PMH-DM (last A1C 6.4, taking Glyburide), heart disease, HTN    CURRENT MEDICATIONS: No current outpatient medications on file. (Ophthalmic Drugs)   Current Facility-Administered Medications (Ophthalmic Drugs)  Medication Route   aflibercept  (EYLEA ) SOLN 2 mg Intravitreal   aflibercept  (EYLEA ) SOLN 2 mg Intravitreal   Current Outpatient Medications (Other)  Medication Sig   amLODipine  (NORVASC ) 5 MG tablet Take 5 mg by mouth 2 (two) times daily.   aspirin EC 81 MG tablet Take 81 mg by mouth daily.   calcium acetate, Phos Binder, (PHOSLYRA) 667 MG/5ML SOLN Take 800 mg by mouth 3 (three) times daily with meals.    calcium carbonate (OS-CAL) 1250 (500 Ca) MG chewable tablet Chew 1 tablet by mouth daily.   cloNIDine (CATAPRES) 0.1 MG tablet Take 0.1 mg by mouth 2 (two) times daily.   fish oil-omega-3 fatty acids 1000 MG capsule Take 1 g by mouth daily.   furosemide  (LASIX ) 20 MG tablet Take 80 mg by mouth daily.    glyBURIDE (DIABETA) 5 MG tablet Take 10 mg by mouth 2 (two) times daily.   metoprolol  succinate (TOPROL -XL) 100 MG 24 hr tablet Take 50 mg by mouth at bedtime. Take with or immediately following a meal.    Multiple Vitamin (MULTIVITAMIN WITH MINERALS) TABS Take 1 tablet by mouth daily.   tirzepatide  (MOUNJARO ) 5 MG/0.5ML Pen Inject 5 mg into the skin once a week.   tirzepatide  (MOUNJARO ) 5 MG/0.5ML Pen Inject 5 mg into the skin once a week.   Current  Facility-Administered Medications (Other)  Medication Route   Bevacizumab  (AVASTIN ) SOLN 1.25 mg Intravitreal   Bevacizumab  (AVASTIN ) SOLN 1.25 mg Intravitreal   Bevacizumab  (AVASTIN ) SOLN 1.25 mg Intravitreal   Bevacizumab  (AVASTIN ) SOLN 1.25 mg Intravitreal   Bevacizumab  (AVASTIN ) SOLN 1.25 mg Intravitreal   REVIEW OF SYSTEMS:   ALLERGIES Allergies  Allergen Reactions   Bidil [Isosorb Dinitrate-Hydralazine ] Shortness Of Breath    Chest pain, SOB   PAST MEDICAL HISTORY Past Medical History:  Diagnosis Date   Chronic kidney disease    Diabetes mellitus    Hypertension    Past Surgical History:  Procedure Laterality Date   AV FISTULA PLACEMENT Left 09/19/2015   Procedure: Creation of Left  Arm Brachial Cephalic Arteriovenous Fistula;  Surgeon: Carlin FORBES Haddock, MD;  Location: [facility] OR;  Service: Vascular;  Laterality: Left;   BREAST BIOPSY     BREAST SURGERY  2009   right breast-benign   CATARACT EXTRACTION     COLONOSCOPY W/ BIOPSIES AND POLYPECTOMY     ENDOMETRIAL BIOPSY  12/23/2011   EYE SURGERY     MULTIPLE TOOTH EXTRACTIONS     REFRACTIVE SURGERY  2011   right eye   TOTAL ABDOMINAL HYSTERECTOMY W/ BILATERAL SALPINGOOPHORECTOMY  01/21/2012   w/ LND    FAMILY HISTORY Family History  Problem Relation Age of Onset   Cancer Mother    Diabetes Father  SOCIAL HISTORY Social History   Tobacco Use   Smoking status: Never   Smokeless tobacco: Never   Tobacco comments:    never used  Vaping Use   Vaping status: Never Used  Substance Use Topics   Alcohol use: No   Drug use: No       OPHTHALMIC EXAM:   Not recorded     IMAGING AND PROCEDURES  Imaging and Procedures for @TODAY @          ASSESSMENT/PLAN:    ICD-10-CM   1. Proliferative diabetic retinopathy of both eyes with macular edema associated with type 2 diabetes mellitus (HCC)  Z88.6486     2. Encounter for long-term (current) use of insulin  (HCC)  Z79.4     3. Essential hypertension   I10     4. Hypertensive retinopathy of both eyes  H35.033     5. Pseudophakia  Z96.1     6. Epiretinal membrane, left eye  H35.372     7. 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 Proliferative diabetic retinopathy w/ mild DME, OU - former pt of Dr. Jarold and Dr. Lilian  Records obtained from [facility]  Dr. Lilian -- [facility] 2008-2009  Dr. Jarold -- PPV, laser for [facility],  8.19.10    Focal laser 2.25.11 - exam shows preretinal fibrosis OU (OS > OD) - FA (8.13.19) with low signal, but shows late focal peripheral leakage OU  - repeat FA (11.11.25) OD: Mild late focal leakage SN periphery--stable from 2019; OS: Mild late focal leakage along IT arcades--stable from 2019; no active NV OU - OCT NI:Ezmdpduzwu IRF/edema -- greatest IN macula--slightly improved centrally, OS: +IRF/edema nasal fovea and macula--improved, vitreous traction at disc and distal arcades -- stable from prior at 4.9 weeks - S/P PRP fill in OS (08.13.19)  - S/P PRP fill in OD (08.27.19) - s/p IVA OD (09.10.19), #2 (10.08.19), #3 (11.05.19), #4 (12.03.19), #5 (01.09.20), #6 (10.13.25), #7 (11.11.25). #8 (12.15.25) - s/p IVA OS #1 (10.13.25), #2 (11.11.25), #3 (12.15.25) ============== - s/p IVE OD #1 (02.06.20), #2 (03.05.20) - BCVA OD 20/30 stable; OS 20/20 stable - recommend IVA OU (OD #9 & OS #4) today, 01.19.26 with f/u in 4-5 weeks - pt wishes to proceed with IVA - RBA of procedure discussed, questions answered - IVA informed consent obtained and signed (OU) 10.13.25 - see procedure note - Pt would owe 20% for Eylea , option discussed.  - f/u 4-5 weeks for DFE/OCT/possible injection  3. 4. Hypertensive retinopathy OU - discussed importance of tight BP control - monitor   5. Pseudophakia OU  - s/p CE/IOL OU -- pt unable to recall when and with who  - doing well  - monitor   6. Epiretinal membrane, OS - The natural history, anatomy, potential for loss of vision, and treatment options including  vitrectomy techniques and the complications of endophthalmitis, retinal detachment, vitreous hemorrhage, cataract progression and permanent vision loss discussed with the patient. - mild ERM over macula - monitor   7. Preretinal fibrosis OU - OD: mild patches in periphery - OS: significant focal areas over disc and along distal temporal arcades -- causing traction and IRF - stable OU - monitor for now -- may require surgical intervention / peeling eventually, but BCVA 20/30 OD and 20/20 OS  Ophthalmic Meds Ordered this visit:  No orders of the defined types were placed in this encounter.    No follow-ups on file.  There are no Patient  Instructions on file for this visit.  This document serves as a record of services personally performed by Redell JUDITHANN Hans, MD, PhD. It was created on their behalf by Paulina Jamse Gay an ophthalmic technician. The creation of this record is the provider's dictation and/or activities during the visit.   Electronically signed by: Paulina JONETTA Gay  06/04/2024  2:37 PM    Redell JUDITHANN Hans, M.D., Ph.D. Diseases & Surgery of the Retina and Vitreous [facility] 05/10/2024    Abbreviations: M myopia (nearsighted); A astigmatism; H hyperopia (farsighted); P presbyopia; Mrx spectacle prescription;  CTL contact lenses; OD right eye; OS left eye; OU both eyes  XT exotropia; ET esotropia; PEK punctate epithelial keratitis; PEE punctate epithelial erosions; DES dry eye syndrome; MGD meibomian gland dysfunction; ATs artificial tears; PFAT's preservative free artificial tears; NSC nuclear sclerotic cataract; PSC posterior subcapsular cataract; ERM epi-retinal membrane; PVD posterior vitreous detachment; RD retinal detachment; DM diabetes mellitus; DR diabetic retinopathy; NPDR non-proliferative diabetic retinopathy; PDR proliferative diabetic retinopathy; CSME clinically significant macular edema; DME diabetic macular edema; dbh dot blot hemorrhages;  CWS cotton wool spot; POAG primary open angle glaucoma; C/D cup-to-disc ratio; HVF humphrey visual field; GVF goldmann visual field; OCT optical coherence tomography; IOP intraocular pressure; BRVO Branch retinal vein occlusion; CRVO central retinal vein occlusion; CRAO central retinal artery occlusion; BRAO branch retinal artery occlusion; RT retinal tear; SB scleral buckle; PPV pars plana vitrectomy; VH Vitreous hemorrhage; PRP panretinal laser photocoagulation; IVK intravitreal kenalog; VMT vitreomacular traction; MH Macular hole;  NVD neovascularization of the disc; NVE neovascularization elsewhere; AREDS age related eye disease study; ARMD age related macular degeneration; POAG primary open angle glaucoma; EBMD epithelial/anterior basement membrane dystrophy; ACIOL anterior chamber intraocular lens; IOL intraocular lens; PCIOL posterior chamber intraocular lens; Phaco/IOL phacoemulsification with intraocular lens placement; PRK photorefractive keratectomy; LASIK laser assisted in situ keratomileusis; HTN hypertension; DM diabetes mellitus; COPD chronic obstructive pulmonary disease  "

## 2024-06-14 ENCOUNTER — Encounter (INDEPENDENT_AMBULATORY_CARE_PROVIDER_SITE_OTHER): Admitting: Ophthalmology

## 2024-06-14 DIAGNOSIS — H35033 Hypertensive retinopathy, bilateral: Secondary | ICD-10-CM

## 2024-06-14 DIAGNOSIS — I10 Essential (primary) hypertension: Secondary | ICD-10-CM

## 2024-06-14 DIAGNOSIS — E113513 Type 2 diabetes mellitus with proliferative diabetic retinopathy with macular edema, bilateral: Secondary | ICD-10-CM

## 2024-06-14 DIAGNOSIS — H35372 Puckering of macula, left eye: Secondary | ICD-10-CM

## 2024-06-14 DIAGNOSIS — H35373 Puckering of macula, bilateral: Secondary | ICD-10-CM

## 2024-06-14 DIAGNOSIS — Z794 Long term (current) use of insulin: Secondary | ICD-10-CM

## 2024-06-14 DIAGNOSIS — Z961 Presence of intraocular lens: Secondary | ICD-10-CM

## 2024-06-15 ENCOUNTER — Ambulatory Visit (INDEPENDENT_AMBULATORY_CARE_PROVIDER_SITE_OTHER): Admitting: Ophthalmology

## 2024-06-15 ENCOUNTER — Encounter (INDEPENDENT_AMBULATORY_CARE_PROVIDER_SITE_OTHER): Payer: Self-pay | Admitting: Ophthalmology

## 2024-06-15 DIAGNOSIS — Z794 Long term (current) use of insulin: Secondary | ICD-10-CM

## 2024-06-15 DIAGNOSIS — E113513 Type 2 diabetes mellitus with proliferative diabetic retinopathy with macular edema, bilateral: Secondary | ICD-10-CM

## 2024-06-15 DIAGNOSIS — I10 Essential (primary) hypertension: Secondary | ICD-10-CM

## 2024-06-15 DIAGNOSIS — H35372 Puckering of macula, left eye: Secondary | ICD-10-CM

## 2024-06-15 DIAGNOSIS — Z961 Presence of intraocular lens: Secondary | ICD-10-CM | POA: Diagnosis not present

## 2024-06-15 DIAGNOSIS — H35373 Puckering of macula, bilateral: Secondary | ICD-10-CM

## 2024-06-15 DIAGNOSIS — H35033 Hypertensive retinopathy, bilateral: Secondary | ICD-10-CM | POA: Diagnosis not present

## 2024-06-15 MED ORDER — BEVACIZUMAB CHEMO INJECTION 1.25MG/0.05ML SYRINGE FOR KALEIDOSCOPE
1.2500 mg | INTRAVITREAL | Status: AC | PRN
  Administered 2024-06-15: 1.25 mg via INTRAVITREAL

## 2024-06-15 NOTE — Progress Notes (Signed)
 " [facility] - Clinic Note  06/15/2024     CHIEF COMPLAINT Patient presents for Retina Follow Up   HISTORY OF PRESENT ILLNESS: Kristy Santos is a 74 y.o. female who presents to the clinic today for:   HPI     Retina Follow Up   Patient presents with  Diabetic Retinopathy.  In both eyes.  Severity is moderate.  Duration of 5 weeks.  Since onset it is stable.  I, the attending physician,  performed the HPI with the patient and updated documentation appropriately.        Comments   5 week Retina eval. Patient states no changes noticed with vision. Blood sugar 103      Last edited by Valdemar Rogue, MD on 06/15/2024 12:44 PM.    Patient states the vision is improving.   Referring physician: Larnell Hamilton, MD [address] [place],  [place] [address]  HISTORICAL INFORMATION:   Selected notes from the MEDICAL RECORD NUMBER Referred by Dr. Hamilton L. Holwerda for DM exam LEE:  Ocular Hx-pseudo OU PMH-DM (last A1C 6.4, taking Glyburide), heart disease, HTN    CURRENT MEDICATIONS: No current outpatient medications on file. (Ophthalmic Drugs)   Current Facility-Administered Medications (Ophthalmic Drugs)  Medication Route   aflibercept  (EYLEA ) SOLN 2 mg Intravitreal   aflibercept  (EYLEA ) SOLN 2 mg Intravitreal   Current Outpatient Medications (Other)  Medication Sig   amLODipine  (NORVASC ) 5 MG tablet Take 5 mg by mouth 2 (two) times daily.   aspirin EC 81 MG tablet Take 81 mg by mouth daily.   calcium acetate, Phos Binder, (PHOSLYRA) 667 MG/5ML SOLN Take 800 mg by mouth 3 (three) times daily with meals.    calcium carbonate (OS-CAL) 1250 (500 Ca) MG chewable tablet Chew 1 tablet by mouth daily.   cloNIDine (CATAPRES) 0.1 MG tablet Take 0.1 mg by mouth 2 (two) times daily.   fish oil-omega-3 fatty acids 1000 MG capsule Take 1 g by mouth daily.   furosemide  (LASIX ) 20 MG tablet Take 80 mg by mouth daily.    glyBURIDE (DIABETA) 5 MG tablet Take 10 mg by  mouth 2 (two) times daily.   metoprolol  succinate (TOPROL -XL) 100 MG 24 hr tablet Take 50 mg by mouth at bedtime. Take with or immediately following a meal.    Multiple Vitamin (MULTIVITAMIN WITH MINERALS) TABS Take 1 tablet by mouth daily.   tirzepatide  (MOUNJARO ) 5 MG/0.5ML Pen Inject 5 mg into the skin once a week.   tirzepatide  (MOUNJARO ) 5 MG/0.5ML Pen Inject 5 mg into the skin once a week.   Current Facility-Administered Medications (Other)  Medication Route   Bevacizumab  (AVASTIN ) SOLN 1.25 mg Intravitreal   Bevacizumab  (AVASTIN ) SOLN 1.25 mg Intravitreal   Bevacizumab  (AVASTIN ) SOLN 1.25 mg Intravitreal   Bevacizumab  (AVASTIN ) SOLN 1.25 mg Intravitreal   Bevacizumab  (AVASTIN ) SOLN 1.25 mg Intravitreal   REVIEW OF SYSTEMS: ROS   Positive for: Endocrine, Eyes Negative for: Constitutional, Gastrointestinal, Neurological, Skin, Genitourinary, Musculoskeletal, HENT, Cardiovascular, Respiratory, Psychiatric, Allergic/Imm, Heme/Lymph Last edited by German Olam BRAVO, COT on 06/15/2024  8:37 AM.      ALLERGIES Allergies  Allergen Reactions   Bidil [Isosorb Dinitrate-Hydralazine ] Shortness Of Breath    Chest pain, SOB   PAST MEDICAL HISTORY Past Medical History:  Diagnosis Date   Chronic kidney disease    Diabetes mellitus    Hypertension    Past Surgical History:  Procedure Laterality Date   AV FISTULA PLACEMENT Left 09/19/2015   Procedure:  Creation of Left  Arm Brachial Cephalic Arteriovenous Fistula;  Surgeon: Carlin FORBES Haddock, MD;  Location: [facility] OR;  Service: Vascular;  Laterality: Left;   BREAST BIOPSY     BREAST SURGERY  2009   right breast-benign   CATARACT EXTRACTION     COLONOSCOPY W/ BIOPSIES AND POLYPECTOMY     ENDOMETRIAL BIOPSY  12/23/2011   EYE SURGERY     MULTIPLE TOOTH EXTRACTIONS     REFRACTIVE SURGERY  2011   right eye   TOTAL ABDOMINAL HYSTERECTOMY W/ BILATERAL SALPINGOOPHORECTOMY  01/21/2012   w/ LND    FAMILY HISTORY Family History  Problem Relation  Age of Onset   Cancer Mother    Diabetes Father     SOCIAL HISTORY Social History   Tobacco Use   Smoking status: Never   Smokeless tobacco: Never   Tobacco comments:    never used  Vaping Use   Vaping status: Never Used  Substance Use Topics   Alcohol use: No   Drug use: No       OPHTHALMIC EXAM:   Base Eye Exam     Visual Acuity (Snellen - Linear)       Right Left   Dist [place] 20/30 -3 20/20 -2   Dist ph [place] 20/NI 20/NI         Tonometry (Tonopen, 10:23 AM)       Right Left   Pressure 20 18         Pupils       Dark Light Shape React APD   Right 3 2 Round Brisk None   Left 3 2 Round Brisk None         Visual Fields (Counting fingers)       Left Right    Full Full         Extraocular Movement       Right Left    Full, Ortho Full, Ortho         Neuro/Psych     Oriented x3: Yes   Mood/Affect: Normal         Dilation     Both eyes: 1.0% Mydriacyl, 2.5% Phenylephrine @ 8:46 AM           Slit Lamp and Fundus Exam     Slit Lamp Exam       Right Left   Lids/Lashes Dermatochalasis - upper lid, mild Meibomian gland dysfunction Dermatochalasis - upper lid   Conjunctiva/Sclera Mild Melanosis, Nasal Pinguecula Melanosis, Temporal Pinguecula   Cornea Arcus, Temporal Well healed cataract wounds, Debris in tear film Arcus, Temporal Well healed cataract wounds   Anterior Chamber Deep and quiet Deep and quiet   Iris Round and dilated, No NVI Round and dilated, No NVI   Lens PC IOL in good position, trace PCO PC IOL in good position   Anterior Vitreous post vitrectomy, fibrosis along distal ST arcade, Vitreous syneresis Vitreous syneresis         Fundus Exam       Right Left   Disc Sharp, trace Pallor, Compact fibrosis overlying disc, Temporal Peripapillary atrophy   C/D Ratio 0.2 0.3   Macula Blunted foveal reflex, +cystic changes persistent, scatttered DBH, scattered focal laser scars, Epiretinal membrane, cluster of IRH temporal  to fovea improving, Retinal pigment epithelial mottling Blunted foveal reflex, Epiretinal membrane, scattered DBH, focal laser scars, small macroaneurysm along IT arcade--resolved, + edema nasal mac persistent   Vessels Vascular attenuation, Copper wiring, AV crossing changes, mildly Tortuous, fibrosis  along distal ST arcade -- stable Vascular attenuation, Copper wiring, AV crossing changes, mildly Tortuous, fibrosis overlying distal ST arcade, ?release IT arcades   Periphery Attached, dense 360 PRP and good fill-in, scattered patches of fibrosis Attached, scattered pattern 360 PRP in place with good fill-in, scattered fibrosis            IMAGING AND PROCEDURES  Imaging and Procedures for @TODAY @  OCT, Retina - OU - Both Eyes       Right Eye Quality was good. Central Foveal Thickness: 222. Progression has improved. Findings include no SRF, abnormal foveal contour, retinal drusen , intraretinal fluid, outer retinal atrophy (Persistent IRF/edema -- greatest IN macula, slightly improved centrally).   Left Eye Quality was good. Central Foveal Thickness: 195. Progression has worsened. Findings include no SRF, abnormal foveal contour, outer retinal atrophy (Persistent +IRF/edema nasal fovea and macula--slightly increased, vitreous traction at disc and distal arcades).   Notes *Images captured and stored on drive  Diagnosis / Impression:  OD: Persistent IRF/edema -- greatest IN macula, slightly improved centrally OS: Persistent +IRF/edema nasal fovea and macula--slightly increased, vitreous traction at disc and distal arcades  Clinical management:  See below  Abbreviations: NFP - Normal foveal profile. CME - cystoid macular edema. PED - pigment epithelial detachment. IRF - intraretinal fluid. SRF - subretinal fluid. EZ - ellipsoid zone. ERM - epiretinal membrane. ORA - outer retinal atrophy. ORT - outer retinal tubulation. SRHM - subretinal hyper-reflective material       Intravitreal  Injection, Pharmacologic Agent - OD - Right Eye       Time Out 06/15/2024. 10:19 AM. Confirmed correct patient, procedure, site, and patient consented.   Anesthesia Topical anesthesia was used. Anesthetic medications included Lidocaine  2%, Proparacaine 0.5%.   Procedure Preparation included 5% betadine to ocular surface, eyelid speculum. A (32g) needle was used.   Injection: 1.25 mg Bevacizumab  1.25mg /0.100ml   Route: Intravitreal, Site: Right Eye   NDC: H525437, Lot: [id number], Expiration date: 08/12/2024   Post-op Post injection exam found visual acuity of at least counting fingers. The patient tolerated the procedure well. There were no complications. The patient received written and verbal post procedure care education.      Intravitreal Injection, Pharmacologic Agent - OS - Left Eye       Time Out 06/15/2024. 10:19 AM. Confirmed correct patient, procedure, site, and patient consented.   Anesthesia Topical anesthesia was used. Anesthetic medications included Lidocaine  2%, Proparacaine 0.5%.   Procedure Preparation included 5% betadine to ocular surface, eyelid speculum. A (32g) needle was used.   Injection: 1.25 mg Bevacizumab  1.25mg /0.40ml   Route: Intravitreal, Site: Left Eye   NDC: H525437, Lot: [id number], Expiration date: 09/12/2024   Post-op Post injection exam found visual acuity of at least counting fingers. The patient tolerated the procedure well. There were no complications. The patient received written and verbal post procedure care education.            ASSESSMENT/PLAN:    ICD-10-CM   1. Proliferative diabetic retinopathy of both eyes with macular edema associated with type 2 diabetes mellitus (HCC)  E11.3513 OCT, Retina - OU - Both Eyes    Intravitreal Injection, Pharmacologic Agent - OD - Right Eye    Intravitreal Injection, Pharmacologic Agent - OS - Left Eye    Bevacizumab  (AVASTIN ) SOLN 1.25 mg    Bevacizumab  (AVASTIN ) SOLN 1.25 mg    2.  Encounter for long-term (current) use of insulin  (HCC)  Z79.4     3. Essential hypertension  I10     4. Hypertensive retinopathy of both eyes  H35.033     5. Pseudophakia  Z96.1     6. Epiretinal membrane, left eye  H35.372     7. 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 Proliferative diabetic retinopathy w/ mild DME, OU - former pt of Dr. Jarold and Dr. Lilian  Records obtained from [facility]  Dr. Lilian -- [facility] 2008-2009  Dr. Jarold -- PPV, laser for [facility],  8.19.10    Focal laser 2.25.11 - exam shows preretinal fibrosis OU (OS > OD) - FA (8.13.19) with low signal, but shows late focal peripheral leakage OU - repeat FA (11.11.25) OD: Mild late focal leakage SN periphery--stable from 2019; OS: Mild late focal leakage along IT arcades--stable from 2019; no active NV OU - OCT OD: Persistent IRF/edema -- greatest IN macula--slightly improved centrally, OS: +IRF/edema nasal fovea and macula--improved, vitreous traction at disc and distal arcades -- stable from prior at 4.9 weeks - S/P PRP fill in OS (08.13.19)  - S/P PRP fill in OD (08.27.19) - s/p IVA OD (09.10.19), #2 (10.08.19), #3 (11.05.19), #4 (12.03.19), #5 (01.09.20), #6 (10.13.25), #7 (11.11.25). #8 (12.15.25) - s/p IVA OS #1 (10.13.25), #2 (11.11.25), #3 (12.15.25) ============== - s/p IVE OD #1 (02.06.20), #2 (03.05.20) - BCVA OD 20/30; OS 20/20 stable OU - recommend IVA OU (OD #9 & OS #4) today, 01.20.26 with f/u in 4-5 weeks - pt wishes to proceed with IVA - RBA of procedure discussed, questions answered - IVA informed consent obtained and signed (OU) 10.13.25 - see procedure note - Pt would owe 20% for Eylea , option discussed.  - f/u 4 weeks for DFE/OCT/possible injection  3. 4. Hypertensive retinopathy OU - discussed importance of tight BP control - monitor   5. Pseudophakia OU  - s/p CE/IOL OU -- pt unable to recall when and with who  - doing well  - monitor   6. Epiretinal membrane, OS - The  natural history, anatomy, potential for loss of vision, and treatment options including vitrectomy techniques and the complications of endophthalmitis, retinal detachment, vitreous hemorrhage, cataract progression and permanent vision loss discussed with the patient. - mild ERM over macula - monitor   7. Preretinal fibrosis OU - OD: mild patches in periphery - OS: significant focal areas over disc and along distal temporal arcades -- causing traction and IRF - stable OU - monitor for now -- may require surgical intervention / peeling eventually, but BCVA 20/30 OD and 20/20 OS  Ophthalmic Meds Ordered this visit:  Meds ordered this encounter  Medications   Bevacizumab  (AVASTIN ) SOLN 1.25 mg   Bevacizumab  (AVASTIN ) SOLN 1.25 mg     Return in about 5 weeks (around 07/20/2024) for f/u, PDR, DFE, OCT, Possible, IVA, OU.  There are no Patient Instructions on file for this visit.  This document serves as a record of services personally performed by Redell JUDITHANN Hans, MD, PhD. It was created on their behalf by Almetta Pesa, an ophthalmic technician. The creation of this record is the provider's dictation and/or activities during the visit.    Electronically signed by: Almetta Pesa, OA, 06/22/24  12:22 PM  This document serves as a record of services personally performed by Redell JUDITHANN Hans, MD, PhD. It was created on their behalf by Wanda GEANNIE Keens, COT an ophthalmic technician. The creation of this record is the provider's dictation and/or activities during the visit.    Electronically signed by:  Wanda  GEANNIE Keens, COT  06/22/24 12:22 PM  Redell JUDITHANN Hans, M.D., Ph.D. Diseases & Surgery of the Retina and Vitreous [facility]  I have reviewed the above documentation for accuracy and completeness, and I agree with the above. Redell JUDITHANN Hans, M.D., Ph.D. 06/22/24 12:24 PM   Abbreviations: M myopia (nearsighted); A astigmatism; H hyperopia (farsighted); P  presbyopia; Mrx spectacle prescription;  CTL contact lenses; OD right eye; OS left eye; OU both eyes  XT exotropia; ET esotropia; PEK punctate epithelial keratitis; PEE punctate epithelial erosions; DES dry eye syndrome; MGD meibomian gland dysfunction; ATs artificial tears; PFAT's preservative free artificial tears; NSC nuclear sclerotic cataract; PSC posterior subcapsular cataract; ERM epi-retinal membrane; PVD posterior vitreous detachment; RD retinal detachment; DM diabetes mellitus; DR diabetic retinopathy; NPDR non-proliferative diabetic retinopathy; PDR proliferative diabetic retinopathy; CSME clinically significant macular edema; DME diabetic macular edema; dbh dot blot hemorrhages; CWS cotton wool spot; POAG primary open angle glaucoma; C/D cup-to-disc ratio; HVF humphrey visual field; GVF goldmann visual field; OCT optical coherence tomography; IOP intraocular pressure; BRVO Branch retinal vein occlusion; CRVO central retinal vein occlusion; CRAO central retinal artery occlusion; BRAO branch retinal artery occlusion; RT retinal tear; SB scleral buckle; PPV pars plana vitrectomy; VH Vitreous hemorrhage; PRP panretinal laser photocoagulation; IVK intravitreal kenalog; VMT vitreomacular traction; MH Macular hole;  NVD neovascularization of the disc; NVE neovascularization elsewhere; AREDS age related eye disease study; ARMD age related macular degeneration; POAG primary open angle glaucoma; EBMD epithelial/anterior basement membrane dystrophy; ACIOL anterior chamber intraocular lens; IOL intraocular lens; PCIOL posterior chamber intraocular lens; Phaco/IOL phacoemulsification with intraocular lens placement; PRK photorefractive keratectomy; LASIK laser assisted in situ keratomileusis; HTN hypertension; DM diabetes mellitus; COPD chronic obstructive pulmonary disease  "

## 2024-06-17 ENCOUNTER — Other Ambulatory Visit (HOSPITAL_COMMUNITY): Payer: Self-pay

## 2024-06-18 ENCOUNTER — Other Ambulatory Visit (HOSPITAL_COMMUNITY): Payer: Self-pay

## 2024-06-29 ENCOUNTER — Other Ambulatory Visit (INDEPENDENT_AMBULATORY_CARE_PROVIDER_SITE_OTHER): Payer: Self-pay

## 2024-07-20 ENCOUNTER — Encounter (INDEPENDENT_AMBULATORY_CARE_PROVIDER_SITE_OTHER): Admitting: Ophthalmology
